# Patient Record
Sex: Male | Born: 1940 | Hispanic: Yes | Marital: Married | Smoking: Former smoker
Health system: Southern US, Community
[De-identification: ages and names within clinical notes are randomized; demographics above are authoritative.]

## PROBLEM LIST (undated history)

## (undated) DIAGNOSIS — C801 Malignant (primary) neoplasm, unspecified: Secondary | ICD-10-CM

## (undated) DIAGNOSIS — I10 Essential (primary) hypertension: Secondary | ICD-10-CM

## (undated) DIAGNOSIS — E7800 Pure hypercholesterolemia, unspecified: Secondary | ICD-10-CM

## (undated) DIAGNOSIS — I639 Cerebral infarction, unspecified: Secondary | ICD-10-CM

## (undated) DIAGNOSIS — R569 Unspecified convulsions: Secondary | ICD-10-CM

## (undated) HISTORY — DX: Malignant (primary) neoplasm, unspecified: C80.1

## (undated) HISTORY — DX: Cerebral infarction, unspecified: I63.9

## (undated) HISTORY — DX: Essential (primary) hypertension: I10

## (undated) HISTORY — DX: Pure hypercholesterolemia, unspecified: E78.00

## (undated) HISTORY — DX: Unspecified convulsions: R56.9

## (undated) HISTORY — PX: PORTACATH PLACEMENT: SHX2246

---

## 2009-05-22 ENCOUNTER — Emergency Department: Payer: Self-pay | Admitting: Emergency Medicine

## 2011-11-29 ENCOUNTER — Inpatient Hospital Stay: Payer: Self-pay | Admitting: Internal Medicine

## 2011-11-29 LAB — CBC
HCT: 46.5 % (ref 40.0–52.0)
HGB: 15.8 g/dL (ref 13.0–18.0)
MCHC: 34.1 g/dL (ref 32.0–36.0)
MCV: 95 fL (ref 80–100)
RBC: 4.91 10*6/uL (ref 4.40–5.90)
RDW: 12.6 % (ref 11.5–14.5)
WBC: 10.4 10*3/uL (ref 3.8–10.6)

## 2011-11-29 LAB — URINALYSIS, COMPLETE
Bacteria: NONE SEEN
Bilirubin,UR: NEGATIVE
Leukocyte Esterase: NEGATIVE
Nitrite: NEGATIVE
Squamous Epithelial: <1
WBC UR: 1 /HPF (ref 0–5)

## 2011-11-29 LAB — COMPREHENSIVE METABOLIC PANEL
Albumin: 4.1 g/dL (ref 3.4–5.0)
Alkaline Phosphatase: 66 U/L (ref 50–136)
Bilirubin,Total: 0.7 mg/dL (ref 0.2–1.0)
Co2: 28 mmol/L (ref 21–32)
EGFR (Non-African Amer.): >60
Glucose: 118 mg/dL — ABNORMAL HIGH (ref 65–99)
Osmolality: 278 (ref 275–301)
SGOT(AST): 24 U/L (ref 15–37)
SGPT (ALT): 34 U/L
Sodium: 138 mmol/L (ref 136–145)
Total Protein: 8.6 g/dL — ABNORMAL HIGH (ref 6.4–8.2)

## 2011-11-29 LAB — TROPONIN I: Troponin-I: <0.02 ng/mL

## 2011-11-29 LAB — CK TOTAL AND CKMB (NOT AT ARMC)
CK, Total: 140 U/L (ref 35–232)
CK, Total: 139 U/L (ref 35–232)
CK-MB: 1.3 ng/mL (ref 0.5–3.6)
CK-MB: 1 ng/mL (ref 0.5–3.6)

## 2011-11-29 LAB — HEMOGLOBIN A1C: Hemoglobin A1C: 6.5 % — ABNORMAL HIGH (ref 4.2–6.3)

## 2011-11-30 LAB — LIPID PANEL
Cholesterol: 139 mg/dL (ref 0–200)
HDL Cholesterol: 51 mg/dL (ref 40–60)
Ldl Cholesterol, Calc: 61 mg/dL (ref 0–100)
Triglycerides: 135 mg/dL (ref 0–200)
VLDL Cholesterol, Calc: 27 mg/dL (ref 5–40)

## 2011-11-30 LAB — TROPONIN I: Troponin-I: <0.02 ng/mL

## 2011-11-30 LAB — CK TOTAL AND CKMB (NOT AT ARMC): CK, Total: 143 U/L (ref 35–232)

## 2011-12-01 LAB — COMPREHENSIVE METABOLIC PANEL
Bilirubin,Total: 0.6 mg/dL (ref 0.2–1.0)
Calcium, Total: 8.5 mg/dL (ref 8.5–10.1)
Chloride: 102 mmol/L (ref 98–107)
Co2: 29 mmol/L (ref 21–32)
Creatinine: 0.99 mg/dL (ref 0.60–1.30)
EGFR (Non-African Amer.): >60
Potassium: 3.7 mmol/L (ref 3.5–5.1)
SGPT (ALT): 27 U/L
Sodium: 138 mmol/L (ref 136–145)

## 2011-12-01 LAB — CBC WITH DIFFERENTIAL/PLATELET
Basophil %: 0.5 %
Eosinophil #: 0.2 10*3/uL (ref 0.0–0.7)
Eosinophil %: 1.8 %
Lymphocyte #: 3 10*3/uL (ref 1.0–3.6)
Lymphocyte %: 31.9 %
MCH: 31 pg (ref 26.0–34.0)
MCHC: 33.1 g/dL (ref 32.0–36.0)
MCV: 94 fL (ref 80–100)
Monocyte #: 0.7 10*3/uL (ref 0.0–0.7)
Platelet: 233 10*3/uL (ref 150–440)
RBC: 4.85 10*6/uL (ref 4.40–5.90)
RDW: 13.3 % (ref 11.5–14.5)

## 2012-09-16 ENCOUNTER — Ambulatory Visit: Payer: Self-pay | Admitting: Oncology

## 2012-09-26 ENCOUNTER — Inpatient Hospital Stay: Payer: Self-pay | Admitting: Internal Medicine

## 2012-09-26 ENCOUNTER — Ambulatory Visit: Payer: Self-pay | Admitting: Neurology

## 2012-09-26 LAB — COMPREHENSIVE METABOLIC PANEL
Albumin: 3.9 g/dL (ref 3.4–5.0)
Alkaline Phosphatase: 99 U/L (ref 50–136)
Anion Gap: 6 — ABNORMAL LOW (ref 7–16)
BUN: 17 mg/dL (ref 7–18)
Bilirubin,Total: 0.4 mg/dL (ref 0.2–1.0)
Co2: 30 mmol/L (ref 21–32)
Creatinine: 0.97 mg/dL (ref 0.60–1.30)
EGFR (African American): >60
EGFR (Non-African Amer.): >60
Potassium: 4.3 mmol/L (ref 3.5–5.1)
Total Protein: 8.6 g/dL — ABNORMAL HIGH (ref 6.4–8.2)

## 2012-09-26 LAB — CBC WITH DIFFERENTIAL/PLATELET
Basophil #: 0 10*3/uL (ref 0.0–0.1)
Eosinophil #: 0.3 10*3/uL (ref 0.0–0.7)
Eosinophil %: 2.4 %
HCT: 46.5 % (ref 40.0–52.0)
Lymphocyte %: 24.5 %
MCH: 31.3 pg (ref 26.0–34.0)
MCHC: 33.4 g/dL (ref 32.0–36.0)
MCV: 94 fL (ref 80–100)
Monocyte #: 0.9 x10 3/mm (ref 0.2–1.0)
Monocyte %: 7.9 %
Neutrophil #: 7 10*3/uL — ABNORMAL HIGH (ref 1.4–6.5)
Platelet: 361 10*3/uL (ref 150–440)
RBC: 4.96 10*6/uL (ref 4.40–5.90)

## 2012-09-27 LAB — CBC WITH DIFFERENTIAL/PLATELET
Basophil #: 0 10*3/uL (ref 0.0–0.1)
Basophil %: 0.1 %
Eosinophil #: 0 10*3/uL (ref 0.0–0.7)
Eosinophil %: 0 %
HGB: 13.9 g/dL (ref 13.0–18.0)
Lymphocyte #: 1.4 10*3/uL (ref 1.0–3.6)
Lymphocyte %: 12 %
MCH: 32.7 pg (ref 26.0–34.0)
MCHC: 34.3 g/dL (ref 32.0–36.0)
MCV: 95 fL (ref 80–100)
Neutrophil #: 9.5 10*3/uL — ABNORMAL HIGH (ref 1.4–6.5)
RDW: 13.1 % (ref 11.5–14.5)
WBC: 11.3 10*3/uL — ABNORMAL HIGH (ref 3.8–10.6)

## 2012-09-27 LAB — BASIC METABOLIC PANEL
Calcium, Total: 8.7 mg/dL (ref 8.5–10.1)
Chloride: 104 mmol/L (ref 98–107)
Creatinine: 0.91 mg/dL (ref 0.60–1.30)
EGFR (Non-African Amer.): >60
Glucose: 127 mg/dL — ABNORMAL HIGH (ref 65–99)
Osmolality: 276 (ref 275–301)
Potassium: 4.1 mmol/L (ref 3.5–5.1)
Sodium: 137 mmol/L (ref 136–145)

## 2012-09-28 ENCOUNTER — Ambulatory Visit: Payer: Self-pay | Admitting: Oncology

## 2012-09-28 LAB — BASIC METABOLIC PANEL
Anion Gap: 5 — ABNORMAL LOW (ref 7–16)
Calcium, Total: 8.4 mg/dL — ABNORMAL LOW (ref 8.5–10.1)
Co2: 29 mmol/L (ref 21–32)
EGFR (Non-African Amer.): >60
Glucose: 148 mg/dL — ABNORMAL HIGH (ref 65–99)
Osmolality: 283 (ref 275–301)
Potassium: 3.9 mmol/L (ref 3.5–5.1)
Sodium: 140 mmol/L (ref 136–145)

## 2012-09-28 LAB — CBC WITH DIFFERENTIAL/PLATELET
Basophil #: 0 10*3/uL (ref 0.0–0.1)
Eosinophil #: 0 10*3/uL (ref 0.0–0.7)
HCT: 40.5 % (ref 40.0–52.0)
Lymphocyte %: 10.8 %
MCH: 30.7 pg (ref 26.0–34.0)
MCHC: 32.9 g/dL (ref 32.0–36.0)
MCV: 93 fL (ref 80–100)
Monocyte #: 0.3 x10 3/mm (ref 0.2–1.0)
Monocyte %: 2.3 %
Neutrophil #: 10.9 10*3/uL — ABNORMAL HIGH (ref 1.4–6.5)
Neutrophil %: 86.8 %
Platelet: 297 10*3/uL (ref 150–440)
RBC: 4.34 10*6/uL — ABNORMAL LOW (ref 4.40–5.90)
RDW: 13.3 % (ref 11.5–14.5)

## 2012-10-08 ENCOUNTER — Ambulatory Visit: Payer: Self-pay | Admitting: Vascular Surgery

## 2012-10-09 LAB — CBC CANCER CENTER
Comment - H1-Com1: NORMAL
HCT: 43.3 % (ref 40.0–52.0)
Lymphocytes: 25 %
MCH: 31 pg (ref 26.0–34.0)
MCHC: 33.5 g/dL (ref 32.0–36.0)
Monocytes: 5 %
Platelet: 285 x10 3/mm (ref 150–440)
RBC: 4.67 10*6/uL (ref 4.40–5.90)
RDW: 13.3 % (ref 11.5–14.5)

## 2012-10-13 ENCOUNTER — Ambulatory Visit: Payer: Self-pay | Admitting: Oncology

## 2012-10-13 LAB — PATHOLOGY REPORT

## 2012-10-13 LAB — PLATELET COUNT: Platelet: 300 10*3/uL (ref 150–440)

## 2012-10-13 LAB — PROTIME-INR
INR: 0.9
Prothrombin Time: 12.5 secs (ref 11.5–14.7)

## 2012-10-14 LAB — SLIDE CONSULT, PATHOLOGY ARMC

## 2012-10-15 LAB — CBC CANCER CENTER
Basophil #: 0.1 x10 3/mm (ref 0.0–0.1)
Basophil %: 0.6 %
Eosinophil #: 0.1 x10 3/mm (ref 0.0–0.7)
Eosinophil %: 0.7 %
Lymphocyte #: 1.3 x10 3/mm (ref 1.0–3.6)
Monocyte #: 0.7 x10 3/mm (ref 0.2–1.0)
Monocyte %: 7.2 %
Neutrophil #: 7.3 x10 3/mm — ABNORMAL HIGH (ref 1.4–6.5)
Platelet: 296 x10 3/mm (ref 150–440)
RBC: 4.5 10*6/uL (ref 4.40–5.90)
RDW: 13.3 % (ref 11.5–14.5)
WBC: 9.3 x10 3/mm (ref 3.8–10.6)

## 2012-10-15 LAB — COMPREHENSIVE METABOLIC PANEL
Alkaline Phosphatase: 85 U/L (ref 50–136)
Anion Gap: 10 (ref 7–16)
Bilirubin,Total: 0.3 mg/dL (ref 0.2–1.0)
Calcium, Total: 9.1 mg/dL (ref 8.5–10.1)
Chloride: 99 mmol/L (ref 98–107)
Co2: 28 mmol/L (ref 21–32)
Creatinine: 0.91 mg/dL (ref 0.60–1.30)
EGFR (African American): >60
Potassium: 4.2 mmol/L (ref 3.5–5.1)
SGOT(AST): 15 U/L (ref 15–37)
SGPT (ALT): 32 U/L (ref 12–78)
Sodium: 137 mmol/L (ref 136–145)

## 2012-10-17 ENCOUNTER — Ambulatory Visit: Payer: Self-pay | Admitting: Oncology

## 2012-10-22 LAB — COMPREHENSIVE METABOLIC PANEL
Albumin: 3.1 g/dL — ABNORMAL LOW (ref 3.4–5.0)
Alkaline Phosphatase: 92 U/L (ref 50–136)
Anion Gap: 7 (ref 7–16)
BUN: 12 mg/dL (ref 7–18)
Calcium, Total: 9.1 mg/dL (ref 8.5–10.1)
Co2: 31 mmol/L (ref 21–32)
Creatinine: 0.92 mg/dL (ref 0.60–1.30)
Osmolality: 272 (ref 275–301)
Potassium: 3.9 mmol/L (ref 3.5–5.1)
Total Protein: 6.7 g/dL (ref 6.4–8.2)

## 2012-10-22 LAB — CBC CANCER CENTER
Basophil #: 0 x10 3/mm (ref 0.0–0.1)
Basophil %: 0.5 %
Eosinophil %: 10.7 %
HCT: 40.7 % (ref 40.0–52.0)
Lymphocyte %: 68.4 %
MCHC: 33.7 g/dL (ref 32.0–36.0)
MCV: 92 fL (ref 80–100)
Monocyte #: 0.4 x10 3/mm (ref 0.2–1.0)
Neutrophil #: 0.1 x10 3/mm — ABNORMAL LOW (ref 1.4–6.5)
Neutrophil %: 3.5 %
Platelet: 181 x10 3/mm (ref 150–440)
RBC: 4.43 10*6/uL (ref 4.40–5.90)

## 2012-11-05 LAB — COMPREHENSIVE METABOLIC PANEL
Albumin: 3.3 g/dL — ABNORMAL LOW (ref 3.4–5.0)
Anion Gap: 6 — ABNORMAL LOW (ref 7–16)
BUN: 15 mg/dL (ref 7–18)
Bilirubin,Total: 0.3 mg/dL (ref 0.2–1.0)
Creatinine: 1.06 mg/dL (ref 0.60–1.30)
EGFR (African American): >60
EGFR (Non-African Amer.): >60
Glucose: 123 mg/dL — ABNORMAL HIGH (ref 65–99)
Osmolality: 282 (ref 275–301)
Potassium: 4.2 mmol/L (ref 3.5–5.1)
SGOT(AST): 13 U/L — ABNORMAL LOW (ref 15–37)
SGPT (ALT): 30 U/L (ref 12–78)
Sodium: 140 mmol/L (ref 136–145)
Total Protein: 7.1 g/dL (ref 6.4–8.2)

## 2012-11-05 LAB — CBC CANCER CENTER
Basophil %: 0.8 %
Eosinophil #: 0.1 x10 3/mm (ref 0.0–0.7)
Eosinophil %: 0.5 %
MCH: 30.4 pg (ref 26.0–34.0)
MCHC: 32.9 g/dL (ref 32.0–36.0)
MCV: 93 fL (ref 80–100)
Monocyte #: 1 x10 3/mm (ref 0.2–1.0)
Platelet: 400 x10 3/mm (ref 150–440)
RBC: 4.38 10*6/uL — ABNORMAL LOW (ref 4.40–5.90)
RDW: 13.3 % (ref 11.5–14.5)

## 2012-11-14 ENCOUNTER — Ambulatory Visit: Payer: Self-pay | Admitting: Oncology

## 2012-11-19 LAB — CBC CANCER CENTER
Basophil #: 0.1 x10 3/mm (ref 0.0–0.1)
Basophil %: 0.4 %
Eosinophil #: 0.1 x10 3/mm (ref 0.0–0.7)
HCT: 38.9 % — ABNORMAL LOW (ref 40.0–52.0)
HGB: 12.7 g/dL — ABNORMAL LOW (ref 13.0–18.0)
Lymphocyte #: 3.7 x10 3/mm — ABNORMAL HIGH (ref 1.0–3.6)
MCHC: 32.7 g/dL (ref 32.0–36.0)
MCV: 93 fL (ref 80–100)
Monocyte #: 1.1 x10 3/mm — ABNORMAL HIGH (ref 0.2–1.0)
Monocyte %: 5.5 %
Neutrophil #: 15.9 x10 3/mm — ABNORMAL HIGH (ref 1.4–6.5)
Neutrophil %: 75.9 %
WBC: 20.9 x10 3/mm — ABNORMAL HIGH (ref 3.8–10.6)

## 2012-11-19 LAB — COMPREHENSIVE METABOLIC PANEL
Albumin: 3.2 g/dL — ABNORMAL LOW (ref 3.4–5.0)
Alkaline Phosphatase: 116 U/L (ref 50–136)
Anion Gap: 8 (ref 7–16)
Co2: 30 mmol/L (ref 21–32)
Creatinine: 1 mg/dL (ref 0.60–1.30)
Glucose: 120 mg/dL — ABNORMAL HIGH (ref 65–99)
Osmolality: 277 (ref 275–301)
Potassium: 4 mmol/L (ref 3.5–5.1)
SGOT(AST): 19 U/L (ref 15–37)

## 2012-11-26 LAB — CBC CANCER CENTER
Basophil #: 0 x10 3/mm (ref 0.0–0.1)
Basophil %: 1.1 %
Eosinophil #: 0 x10 3/mm (ref 0.0–0.7)
Eosinophil %: 0.4 %
HCT: 39 % — ABNORMAL LOW (ref 40.0–52.0)
HGB: 13.1 g/dL (ref 13.0–18.0)
Lymphocyte #: 2.6 x10 3/mm (ref 1.0–3.6)
Lymphocyte %: 84 %
MCH: 31 pg (ref 26.0–34.0)
MCHC: 33.5 g/dL (ref 32.0–36.0)
MCV: 93 fL (ref 80–100)
Monocyte #: 0.4 x10 3/mm (ref 0.2–1.0)
Monocyte %: 13.5 %
Neutrophil #: 0 x10 3/mm — ABNORMAL LOW (ref 1.4–6.5)
Neutrophil %: 1 %
Platelet: 226 x10 3/mm (ref 150–440)
RBC: 4.22 10*6/uL — ABNORMAL LOW (ref 4.40–5.90)
RDW: 13.5 % (ref 11.5–14.5)
WBC: 3 x10 3/mm — ABNORMAL LOW (ref 3.8–10.6)

## 2012-12-03 LAB — CBC CANCER CENTER
Basophil %: 0.3 %
Eosinophil #: 0 x10 3/mm (ref 0.0–0.7)
Eosinophil %: 0.1 %
HCT: 36 % — ABNORMAL LOW (ref 40.0–52.0)
HGB: 11.6 g/dL — ABNORMAL LOW (ref 13.0–18.0)
Lymphocyte #: 3.2 x10 3/mm (ref 1.0–3.6)
Lymphocyte %: 11.4 %
MCH: 29.9 pg (ref 26.0–34.0)
MCV: 93 fL (ref 80–100)
Monocyte %: 5.9 %
Neutrophil %: 82.3 %
RDW: 14.3 % (ref 11.5–14.5)
WBC: 28.1 x10 3/mm — ABNORMAL HIGH (ref 3.8–10.6)

## 2012-12-03 LAB — COMPREHENSIVE METABOLIC PANEL
Albumin: 3.3 g/dL — ABNORMAL LOW (ref 3.4–5.0)
Alkaline Phosphatase: 136 U/L (ref 50–136)
BUN: 10 mg/dL (ref 7–18)
Chloride: 100 mmol/L (ref 98–107)
Co2: 27 mmol/L (ref 21–32)
Creatinine: 1.05 mg/dL (ref 0.60–1.30)
EGFR (African American): >60
Potassium: 4.3 mmol/L (ref 3.5–5.1)
SGOT(AST): 22 U/L (ref 15–37)
SGPT (ALT): 36 U/L (ref 12–78)
Sodium: 138 mmol/L (ref 136–145)
Total Protein: 7.4 g/dL (ref 6.4–8.2)

## 2012-12-09 LAB — URINALYSIS, COMPLETE
Bilirubin,UR: NEGATIVE
Leukocyte Esterase: NEGATIVE
Ph: 8 (ref 4.5–8.0)
RBC,UR: <1 /HPF (ref 0–5)
Squamous Epithelial: NONE SEEN
WBC UR: 1 /HPF (ref 0–5)

## 2012-12-09 LAB — CBC WITH DIFFERENTIAL/PLATELET
Basophil #: 0 10*3/uL (ref 0.0–0.1)
Basophil %: 2.6 %
Eosinophil #: 0 10*3/uL (ref 0.0–0.7)
Eosinophil %: 1 %
HGB: 10.3 g/dL — ABNORMAL LOW (ref 13.0–18.0)
Lymphocyte #: 0.7 10*3/uL — ABNORMAL LOW (ref 1.0–3.6)
Lymphocyte %: 63.6 %
MCH: 30.3 pg (ref 26.0–34.0)
MCHC: 33.2 g/dL (ref 32.0–36.0)
Neutrophil %: 27.2 %
RBC: 3.4 10*6/uL — ABNORMAL LOW (ref 4.40–5.90)
RDW: 14 % (ref 11.5–14.5)
WBC: 1 10*3/uL — CL (ref 3.8–10.6)

## 2012-12-09 LAB — COMPREHENSIVE METABOLIC PANEL
Albumin: 2.8 g/dL — ABNORMAL LOW (ref 3.4–5.0)
Alkaline Phosphatase: 97 U/L (ref 50–136)
BUN: 16 mg/dL (ref 7–18)
Co2: 28 mmol/L (ref 21–32)
Creatinine: 0.83 mg/dL (ref 0.60–1.30)
Osmolality: 271 (ref 275–301)
Sodium: 134 mmol/L — ABNORMAL LOW (ref 136–145)
Total Protein: 6.1 g/dL — ABNORMAL LOW (ref 6.4–8.2)

## 2012-12-09 LAB — TROPONIN I: Troponin-I: <0.02 ng/mL

## 2012-12-10 ENCOUNTER — Inpatient Hospital Stay: Payer: Self-pay | Admitting: Internal Medicine

## 2012-12-10 LAB — TROPONIN I
Troponin-I: 0.03 ng/mL
Troponin-I: 0.02 ng/mL

## 2012-12-10 LAB — CK TOTAL AND CKMB (NOT AT ARMC)
CK, Total: 70 U/L (ref 35–232)
CK-MB: <0.5 ng/mL — ABNORMAL LOW (ref 0.5–3.6)
CK-MB: <0.5 ng/mL — ABNORMAL LOW (ref 0.5–3.6)

## 2012-12-11 LAB — CBC WITH DIFFERENTIAL/PLATELET
Eosinophil #: 0 10*3/uL (ref 0.0–0.7)
HCT: 29.5 % — ABNORMAL LOW (ref 40.0–52.0)
HGB: 10.4 g/dL — ABNORMAL LOW (ref 13.0–18.0)
Lymphocyte #: 1.1 10*3/uL (ref 1.0–3.6)
MCH: 32.6 pg (ref 26.0–34.0)
Monocyte #: 0.6 x10 3/mm (ref 0.2–1.0)
RBC: 3.17 10*6/uL — ABNORMAL LOW (ref 4.40–5.90)
WBC: 1.9 10*3/uL — CL (ref 3.8–10.6)

## 2012-12-11 LAB — BASIC METABOLIC PANEL
Anion Gap: 4 — ABNORMAL LOW (ref 7–16)
Calcium, Total: 8.1 mg/dL — ABNORMAL LOW (ref 8.5–10.1)
EGFR (African American): >60
Osmolality: 267 (ref 275–301)
Potassium: 3.5 mmol/L (ref 3.5–5.1)

## 2012-12-15 ENCOUNTER — Ambulatory Visit: Payer: Self-pay | Admitting: Oncology

## 2012-12-15 LAB — CULTURE, BLOOD (SINGLE)

## 2012-12-22 ENCOUNTER — Ambulatory Visit: Payer: Self-pay | Admitting: Oncology

## 2012-12-28 LAB — COMPREHENSIVE METABOLIC PANEL
Albumin: 3.2 g/dL — ABNORMAL LOW (ref 3.4–5.0)
Alkaline Phosphatase: 90 U/L (ref 50–136)
BUN: 9 mg/dL (ref 7–18)
Bilirubin,Total: 0.2 mg/dL (ref 0.2–1.0)
Chloride: 102 mmol/L (ref 98–107)
Co2: 28 mmol/L (ref 21–32)
Creatinine: 1.01 mg/dL (ref 0.60–1.30)
EGFR (Non-African Amer.): >60
Osmolality: 279 (ref 275–301)
SGOT(AST): 17 U/L (ref 15–37)
SGPT (ALT): 28 U/L (ref 12–78)
Sodium: 140 mmol/L (ref 136–145)
Total Protein: 7.1 g/dL (ref 6.4–8.2)

## 2012-12-28 LAB — CBC CANCER CENTER
Basophil #: 0.1 x10 3/mm (ref 0.0–0.1)
HCT: 36.3 % — ABNORMAL LOW (ref 40.0–52.0)
HGB: 11.6 g/dL — ABNORMAL LOW (ref 13.0–18.0)
Lymphocyte #: 2.4 x10 3/mm (ref 1.0–3.6)
Lymphocyte %: 20 %
MCH: 29.8 pg (ref 26.0–34.0)
MCHC: 32.1 g/dL (ref 32.0–36.0)
MCV: 93 fL (ref 80–100)
Monocyte %: 10.1 %
RBC: 3.9 10*6/uL — ABNORMAL LOW (ref 4.40–5.90)
RDW: 15.4 % — ABNORMAL HIGH (ref 11.5–14.5)
WBC: 12.1 x10 3/mm — ABNORMAL HIGH (ref 3.8–10.6)

## 2013-01-14 ENCOUNTER — Ambulatory Visit: Payer: Self-pay | Admitting: Oncology

## 2013-01-29 LAB — CBC CANCER CENTER
Basophil #: 0 x10 3/mm (ref 0.0–0.1)
Basophil %: 0.2 %
Eosinophil #: 0.2 x10 3/mm (ref 0.0–0.7)
Eosinophil %: 3.3 %
HGB: 13.7 g/dL (ref 13.0–18.0)
Lymphocyte #: 1.4 x10 3/mm (ref 1.0–3.6)
Lymphocyte %: 19.6 %
MCH: 30.7 pg (ref 26.0–34.0)
MCHC: 33 g/dL (ref 32.0–36.0)
MCV: 93 fL (ref 80–100)
Monocyte #: 0.7 x10 3/mm (ref 0.2–1.0)
Monocyte %: 10.6 %
Neutrophil #: 4.6 x10 3/mm (ref 1.4–6.5)
Platelet: 294 x10 3/mm (ref 150–440)
RBC: 4.48 10*6/uL (ref 4.40–5.90)
RDW: 14.7 % — ABNORMAL HIGH (ref 11.5–14.5)

## 2013-02-14 ENCOUNTER — Ambulatory Visit: Payer: Self-pay | Admitting: Oncology

## 2013-02-15 LAB — CBC CANCER CENTER
Basophil #: 0 x10 3/mm (ref 0.0–0.1)
Basophil %: 0.2 %
Eosinophil #: 0.3 x10 3/mm (ref 0.0–0.7)
HCT: 41.8 % (ref 40.0–52.0)
HGB: 13.8 g/dL (ref 13.0–18.0)
Lymphocyte %: 19.3 %
MCH: 30.3 pg (ref 26.0–34.0)
MCHC: 33 g/dL (ref 32.0–36.0)
MCV: 92 fL (ref 80–100)
Monocyte #: 0.8 x10 3/mm (ref 0.2–1.0)
Neutrophil #: 4.4 x10 3/mm (ref 1.4–6.5)
Platelet: 226 x10 3/mm (ref 150–440)
RDW: 13.9 % (ref 11.5–14.5)

## 2013-03-16 ENCOUNTER — Ambulatory Visit: Payer: Self-pay | Admitting: Oncology

## 2013-05-20 ENCOUNTER — Ambulatory Visit: Payer: Self-pay | Admitting: Oncology

## 2013-06-03 LAB — CBC CANCER CENTER
Basophil #: 0 x10 3/mm (ref 0.0–0.1)
Eosinophil #: 0.2 x10 3/mm (ref 0.0–0.7)
Eosinophil %: 2.1 %
HCT: 42.1 % (ref 40.0–52.0)
HGB: 13.8 g/dL (ref 13.0–18.0)
Lymphocyte #: 2.4 x10 3/mm (ref 1.0–3.6)
Lymphocyte %: 30.9 %
MCH: 29.5 pg (ref 26.0–34.0)
MCHC: 32.9 g/dL (ref 32.0–36.0)
MCV: 90 fL (ref 80–100)
Monocyte #: 0.6 x10 3/mm (ref 0.2–1.0)
Platelet: 277 x10 3/mm (ref 150–440)
RDW: 15.1 % — ABNORMAL HIGH (ref 11.5–14.5)
WBC: 7.7 x10 3/mm (ref 3.8–10.6)

## 2013-06-03 LAB — LACTATE DEHYDROGENASE: LDH: 167 U/L (ref 85–241)

## 2013-06-16 ENCOUNTER — Ambulatory Visit: Payer: Self-pay | Admitting: Oncology

## 2013-07-19 ENCOUNTER — Ambulatory Visit: Payer: Self-pay | Admitting: Radiation Oncology

## 2013-08-16 ENCOUNTER — Ambulatory Visit: Payer: Self-pay | Admitting: Radiation Oncology

## 2013-08-26 LAB — CBC CANCER CENTER
Basophil %: 0.2 %
Eosinophil #: 0.1 x10 3/mm (ref 0.0–0.7)
HCT: 44.9 % (ref 40.0–52.0)
HGB: 14.4 g/dL (ref 13.0–18.0)
Lymphocyte #: 2.5 x10 3/mm (ref 1.0–3.6)
Monocyte #: 0.6 x10 3/mm (ref 0.2–1.0)
Monocyte %: 8 %
Neutrophil #: 4.6 x10 3/mm (ref 1.4–6.5)
Neutrophil %: 58.6 %
Platelet: 264 x10 3/mm (ref 150–440)
RDW: 14 % (ref 11.5–14.5)

## 2013-08-26 LAB — LACTATE DEHYDROGENASE: LDH: 161 U/L (ref 85–241)

## 2013-09-16 ENCOUNTER — Ambulatory Visit: Payer: Self-pay | Admitting: Radiation Oncology

## 2013-10-17 ENCOUNTER — Ambulatory Visit: Payer: Self-pay | Admitting: Radiation Oncology

## 2013-11-23 ENCOUNTER — Ambulatory Visit: Payer: Self-pay | Admitting: Oncology

## 2013-11-24 ENCOUNTER — Ambulatory Visit: Payer: Self-pay | Admitting: Oncology

## 2013-11-25 LAB — CBC CANCER CENTER
Basophil #: 0 x10 3/mm (ref 0.0–0.1)
Basophil %: 0.2 %
Eosinophil #: 0.1 x10 3/mm (ref 0.0–0.7)
Eosinophil %: 1.6 %
HCT: 42.9 % (ref 40.0–52.0)
HGB: 13.8 g/dL (ref 13.0–18.0)
Lymphocyte #: 2.9 x10 3/mm (ref 1.0–3.6)
Lymphocyte %: 32.8 %
MCH: 30 pg (ref 26.0–34.0)
MCHC: 32.2 g/dL (ref 32.0–36.0)
MCV: 93 fL (ref 80–100)
MONO ABS: 0.6 x10 3/mm (ref 0.2–1.0)
Monocyte %: 7.1 %
NEUTROS ABS: 5.2 x10 3/mm (ref 1.4–6.5)
Neutrophil %: 58.3 %
PLATELETS: 262 x10 3/mm (ref 150–440)
RBC: 4.6 10*6/uL (ref 4.40–5.90)
RDW: 13.9 % (ref 11.5–14.5)
WBC: 8.9 x10 3/mm (ref 3.8–10.6)

## 2013-11-25 LAB — BASIC METABOLIC PANEL
Anion Gap: 9 (ref 7–16)
BUN: 17 mg/dL (ref 7–18)
CO2: 29 mmol/L (ref 21–32)
Calcium, Total: 9.7 mg/dL (ref 8.5–10.1)
Chloride: 104 mmol/L (ref 98–107)
Creatinine: 1.15 mg/dL (ref 0.60–1.30)
EGFR (Non-African Amer.): >60
GLUCOSE: 95 mg/dL (ref 65–99)
OSMOLALITY: 284 (ref 275–301)
Potassium: 3.9 mmol/L (ref 3.5–5.1)
SODIUM: 142 mmol/L (ref 136–145)

## 2013-11-25 LAB — LACTATE DEHYDROGENASE: LDH: 196 U/L (ref 85–241)

## 2013-12-15 ENCOUNTER — Ambulatory Visit: Payer: Self-pay | Admitting: Oncology

## 2014-01-14 ENCOUNTER — Ambulatory Visit: Payer: Self-pay | Admitting: Oncology

## 2014-02-17 ENCOUNTER — Ambulatory Visit: Payer: Self-pay | Admitting: Oncology

## 2014-03-16 ENCOUNTER — Ambulatory Visit: Payer: Self-pay | Admitting: Oncology

## 2014-04-16 ENCOUNTER — Ambulatory Visit: Payer: Self-pay | Admitting: Oncology

## 2014-05-17 ENCOUNTER — Ambulatory Visit: Payer: Self-pay | Admitting: Oncology

## 2014-05-27 LAB — CBC CANCER CENTER
Basophil #: 0 x10 3/mm (ref 0.0–0.1)
Basophil %: 0.4 %
Eosinophil #: 0.2 x10 3/mm (ref 0.0–0.7)
Eosinophil %: 2.6 %
HCT: 47.1 % (ref 40.0–52.0)
HGB: 15.3 g/dL (ref 13.0–18.0)
Lymphocyte #: 2.6 x10 3/mm (ref 1.0–3.6)
Lymphocyte %: 32.7 %
MCH: 30.4 pg (ref 26.0–34.0)
MCHC: 32.5 g/dL (ref 32.0–36.0)
MCV: 94 fL (ref 80–100)
Monocyte #: 0.7 x10 3/mm (ref 0.2–1.0)
Monocyte %: 8.7 %
NEUTROS PCT: 55.6 %
Neutrophil #: 4.5 x10 3/mm (ref 1.4–6.5)
Platelet: 251 x10 3/mm (ref 150–440)
RBC: 5.04 10*6/uL (ref 4.40–5.90)
RDW: 14.3 % (ref 11.5–14.5)
WBC: 8.1 x10 3/mm (ref 3.8–10.6)

## 2014-05-27 LAB — BASIC METABOLIC PANEL
Anion Gap: 6 — ABNORMAL LOW (ref 7–16)
BUN: 14 mg/dL (ref 7–18)
CALCIUM: 9 mg/dL (ref 8.5–10.1)
Chloride: 105 mmol/L (ref 98–107)
Co2: 28 mmol/L (ref 21–32)
Creatinine: 1.02 mg/dL (ref 0.60–1.30)
EGFR (African American): >60
EGFR (Non-African Amer.): >60
GLUCOSE: 111 mg/dL — AB (ref 65–99)
OSMOLALITY: 279 (ref 275–301)
Potassium: 4 mmol/L (ref 3.5–5.1)
Sodium: 139 mmol/L (ref 136–145)

## 2014-05-27 LAB — LACTATE DEHYDROGENASE: LDH: 164 U/L (ref 85–241)

## 2014-06-09 ENCOUNTER — Ambulatory Visit: Payer: Self-pay | Admitting: Vascular Surgery

## 2014-06-16 ENCOUNTER — Ambulatory Visit: Payer: Self-pay | Admitting: Oncology

## 2014-12-08 ENCOUNTER — Ambulatory Visit
Admit: 2014-12-08 | Disposition: A | Discharge status: Home / Self Care | Payer: Self-pay | Attending: Oncology | Admitting: Oncology

## 2014-12-09 ENCOUNTER — Ambulatory Visit: Payer: Self-pay | Admitting: Oncology

## 2014-12-09 LAB — CBC CANCER CENTER
BASOS ABS: 0 x10 3/mm (ref 0.0–0.1)
Basophil %: 0.4 %
Eosinophil #: 0.3 x10 3/mm (ref 0.0–0.7)
Eosinophil %: 3.8 %
HCT: 44.2 % (ref 40.0–52.0)
HGB: 14.4 g/dL (ref 13.0–18.0)
LYMPHS ABS: 2.1 x10 3/mm (ref 1.0–3.6)
Lymphocyte %: 30.8 %
MCH: 30.2 pg (ref 26.0–34.0)
MCHC: 32.4 g/dL (ref 32.0–36.0)
MCV: 93 fL (ref 80–100)
MONOS PCT: 8.4 %
Monocyte #: 0.6 x10 3/mm (ref 0.2–1.0)
NEUTROS ABS: 3.9 x10 3/mm (ref 1.4–6.5)
Neutrophil %: 56.6 %
PLATELETS: 257 x10 3/mm (ref 150–440)
RBC: 4.76 10*6/uL (ref 4.40–5.90)
RDW: 13.8 % (ref 11.5–14.5)
WBC: 6.9 x10 3/mm (ref 3.8–10.6)

## 2014-12-09 LAB — BASIC METABOLIC PANEL
Anion Gap: 5 — ABNORMAL LOW (ref 7–16)
BUN: 23 mg/dL — ABNORMAL HIGH
CALCIUM: 9.3 mg/dL
Chloride: 105 mmol/L
Co2: 29 mmol/L
Creatinine: 1.08 mg/dL
EGFR (African American): >60
Glucose: 121 mg/dL — ABNORMAL HIGH
Potassium: 4.4 mmol/L
SODIUM: 139 mmol/L

## 2014-12-09 LAB — LACTATE DEHYDROGENASE: LDH: 153 U/L

## 2014-12-16 ENCOUNTER — Ambulatory Visit
Admit: 2014-12-16 | Disposition: A | Discharge status: Home / Self Care | Payer: Self-pay | Attending: Oncology | Admitting: Oncology

## 2014-12-30 ENCOUNTER — Other Ambulatory Visit: Payer: Self-pay | Admitting: Oncology

## 2014-12-30 DIAGNOSIS — C8298 Follicular lymphoma, unspecified, lymph nodes of multiple sites: Secondary | ICD-10-CM

## 2015-01-06 NOTE — Consult Note (Signed)
PATIENT NAME:  Erik Shelton, Erik Shelton MR#:  [id number] DATE OF BIRTH:  [birth year]  DATE OF CONSULTATION:  09/26/2012  REFERRING PHYSICIAN:   CONSULTING PHYSICIAN:  Leotis Pain, MD  REASON FOR CONSULTATION:  Suspected seizure activity.   HISTORY OF PRESENT ILLNESS: This is a 74 year old male with past medical history hypertension, hyperlipidemia, intracranial hemorrhage in 2010, questionable history of seizures 4 years ago who was sent from the walk-in clinic for symptoms that include hoarseness, dysphagia and difficulty breathing. The patient was found to have a peritonsillar abscess with suspicion for possible throat cancer and the patient is scheduled to undergo surgery. Information is obtained from the patient's family members who are at bedside and who speak English and the patient who has difficulty with the [place] language.  During the time when the patient was explained that there was a possibility of him having throat cancer, he became anxious, had what was suspected and what looked like syncopal event and he was noted to have shaking that lasted possibly up to 1 to 2 minutes. When questioned about the shaking, the patient states he was aware of the shaking, he knew what was going on but he said he was very anxious and he was concerned because of the suspected throat cancer. During that time of the event the patient was awake and oriented. There was no tongue biting, no urinary incontinence. As the shaking stopped, the patient's wife states that she was able to talk to him without any problems. There was no postictal state. The patient's family members do state he has anxiety, especially when  there are blood draws and there are medical decisions to be made.  There is a questionable history of seizure activity that happened 4 years ago and it was a similar situation where the patient was told he had a stroke and then at that time he started shaking.  The patient was started on antiepileptic medication  for a year and because there was no seizure activity, he was taken off it.    PAST SURGICAL HISTORY:  None.  REVIEW OF SYSTEMS:  No fever, chills, nausea, vomiting, no chest pain. No shortness of breath. No urinary incontinence. No fecal incontinence. No leg like swelling. No focal weakness on one side of the body compared to the other.   SOCIAL HISTORY:  He gets quit smoking in 1991.  FAMILY HISTORY:  Noncontributory.   NEUROLOGICAL EXAMINATION: VITAL SIGNS:  Temperature 97.8, pulse 99, respirations 18, blood pressure and 139/90. GENERAL:  The rest of neurological evaluation with translation.  The patient is awake oriented x 4, knows day, time, location and is able to tell me how many quarters are and in $1.75.  NEUROLOGIC: Cranial nerves examination:  Pupils are 3 mm and 2 mm, reactive bilaterally.  Extraocular movements are intact. Eyebrows are symmetrical bilaterally. No signs of ptosis noted. Visual fields intact bilaterally. Facial sensation and facial motor intact. Tongue is midline. Uvula elevates symmetrically. Shoulder shrug is equal and symmetrical bilaterally.  Motor examination:  Tone is within normal limits. No rigidity or spasticity noted, 5 out of 5 bilateral upper and lower extremities noted.  Deep tendon reflexes are 2+ and symmetrical bilaterally. Sensation is intact to light touch and pin prick.  Gait was not assessed. No signs of dysmetria on finger-to-nose testing was noted.   MEDICATIONS:  Admitting orders and past notes were reviewed.    LABORATORY AND DIAGNOSTIC DATA:  His laboratory findings and imaging such as CAT scan of the head was  reviewed and did not show any acute intracranial pathology.   IMPRESSION: This is a 74 year old male with history of hypertension, hyperlipidemia, hemorrhagic stroke in 2010, suspected history of seizure about 4 years ago, who comes in with paratonsillar mass and is going to need surgery.  Based on explanation and with the family  I  explained to him what the seizure looked like and the patient's no loss of level of consciousness during, bilateral shaking of his upper and lower extremities upper and lower extremities, I do not do not believe the patient had a real seizure.  If the patient would have a generalized tonic-clonic seizure it is coming from his bilateral hemispheres and there should be loss of consciousness, which apparently did not occur. There was no postictal state, there was no tongue biting, no urinary incontinence. The patient is also very anxious to medical procedures and to hospital jargon, as well as being in the hospital. The patient returned to baseline very quickly and he was able to communicate with his family members less than 5 minutes post incident. I suspect the patient could have had a syncopal episode or he could have had a convulsive syncope or possibly he was shaking because of anxiety. He appears to have similar symptoms about 4 years ago and was actually started on antiepileptic medication and was taken off after seizure freedom for 1 year, so at this point, I do not believe this is true epileptic seizure.  I would not start him on antiepileptic medication unless this recurs. Would not recommend any further imaging such as MRI.  It was a pleasure seeing this patient.      ____________________________ Leotis Pain, MD yz:ct D: 09/26/2012 14:33:17 ET T: 09/27/2012 06:56:44 ET JOB#: [id number]  cc: Leotis Pain, MD, <Dictator> Leotis Pain MD ELECTRONICALLY SIGNED 10/11/2012 13:41

## 2015-01-06 NOTE — H&P (Signed)
PATIENT NAME:  Erik Shelton, Erik Shelton MR#:  [id number] DATE OF BIRTH:  1941-01-31  DATE OF ADMISSION:  09/26/2012  PRIMARY CARE PHYSICIAN:  Dr. Frazier Richards at [facility].   CHIEF COMPLAINT: Hoarseness, dysphagia, and difficulty breathing.   HISTORY OF PRESENT ILLNESS: This is a 74 year old Hispanic male who was seen over at the [facility] for the symptoms described above going on for several months, was sent over to the Emergency Room because of evidence of a peritonsillar abscess, was evaluated by  Nadeen Landau who contacted [place] to admit the patient. The plan is to evaluate this peritonsillar abscess with suspicion for possible throat cancer to undergo surgery per Dr. Loletta Specter today. He is planning to obtain a CT scan of the neck to further evaluate this possible mass. While in the Emergency Room, he had a vasovagal event and has returned back to his baseline state. Shortly after that, according to ER physician, he had a short course of a generalized seizure and was a bit altered shortly after that but now is back to his baseline state. Talking to the family, they state that he has had a seizure history four years ago but no longer on medications. He also has had a history of stroke in the past.   PAST MEDICAL HISTORY: A known history of hypertension, hyperlipidemia, and intracranial hemorrhage in 2010. A questionable history of seizure and a questionable history of CVA in the past, besides the intracranial hemorrhage.   SURGERY: None.   REVIEW OF SYSTEMS: As stated above.   SOCIAL HISTORY: Quit smoking in 1991.   FAMILY HISTORY: Noncontributory.   PHYSICAL EXAMINATION:  VITAL SIGNS: Temperature 97.8, pulse 81, blood pressure 137/76, oxygenation 95%.  GENERAL: This is a non-ill-appearing Hispanic male in no apparent distress, alert and oriented x 3 but non-English-speaking but does understand [place]. The family is at the bedside.  HEENT: Extraocular movements intact. Pupils equal and  reactive to light and accommodation. Oral exam shows significant edema and erythema of the posterior pharynx consistent with a peritonsillar abscess. The airway is minimal at this time, but he is in no acute distress.  CARDIOVASCULAR: Regular rhythm and rhythm. RESPIRATORY: Clear to auscultation bilaterally.  ABDOMEN: Soft, nontender.  SKIN: Normal color, turgor. No cyanosis.  NEUROLOGIC: No focal deficits at this time. Strength 5/5 throughout.   PERTINENT LABS: Sodium 138, potassium not available, creatinine 0.97, glucose of 112, white blood cell count 10.8, hemoglobin 15.5, platelets of 361. A CT scan is pending.   ASSESSMENT AND PLAN:  1. Concern for a throat cancer/peritonsillar abscess. The patient will be admitted for further evaluation per recommendations of Dr. Nadeen Landau. Plans to go to surgery per Dr. Carlis Abbott. Obtain a CT of the neck for further evaluation. Further consult with Oncology will depend upon the biopsy in the procedure.  2. A history of seizure disorder. It is questionable whether or not the patient has had history according to the outside notes. He did have a witnessed seizure per ER physician here, and we will consult Neurology for evaluation. We will hold off on loading him with any seizure medication at this time. Now if he has another seizure, we will go ahead and load him at that time, but we will monitor him closely.  3. Hypertension. Remains stable at this time. Although he is n.p.o., we will hold his medications as he has remains stable off the medications.  4. Hyperlipidemia. We will restart the cholesterol medication once he is able to restart his diet  and swallow pills.   DISPOSITION: He meets inpatient criteria. Anticipate two nights stay for him. Will be followed by Nadeen Landau, Neurology consult pending, anticipate surgery today.  ____________________________ Dion Body, MD kl:jm D: 09/26/2012 11:38:33 ET T: 09/26/2012 12:20:09  ET JOB#: [id number]  cc: Dion Body, MD, <Dictator> Dion Body MD ELECTRONICALLY SIGNED 10/02/2012 10:42

## 2015-01-06 NOTE — Consult Note (Signed)
History of Present Illness:  Reason for Consult T-cell lymphoma recently received chemotherapy, neutropenia.   HPI   Patient admitted to the hospital overnight with increasing shortness of breath and cough.  He was not febrile.  Currently he feels improved, but not back to his baseline.  He last received chemotherapy on December 03, 2012 Followed by [facility] on March 21.  He has no neurologic complaints.  He denies any chest pain. He does not complain of dysphagia today.  He denies any nausea, vomiting, constipation, or diarrhea.  He has no melena or hematochezia.  He has no urinary complaints.  Patient offers no further specific complaints today.  PFSH:  Additional Past Medical and Surgical History Hypertension, hyperlipidemia, intracranial hemorrhage, questionable seizure history.  Family history: Negative and noncontributory.  Social history: Previous tobacco, quit in 1991.  Denies alcohol.   Review of Systems:  Performance Status (ECOG) 0   Review of Systems   As per HPI. Otherwise, 10 point system review was negative.   NURSING NOTES: **Vital Signs.:   27-Mar-14 09:40   Vital Signs Type: Routine   Temperature Temperature (F): 99.1   Celsius: 37.2   Temperature Source: oral   Pulse Pulse: 103   Respirations Respirations: 18   Systolic BP Systolic BP: 354   Diastolic BP (mmHg) Diastolic BP (mmHg): 84   Mean BP: 109   Pulse Ox % Pulse Ox %: 96   Pulse Ox Activity Level: At rest   Oxygen Delivery: Room Air/ 21 %   Physical Exam:  Physical Exam General: Well-developed, well-nourished, no acute distress. Eyes: Pink conjunctiva, anicteric sclera. HEENT: Normocephalic, moist mucous membranes, clear oropharnyx. Lungs: Clear to auscultation bilaterally. Heart: Regular rate and rhythm. No rubs, murmurs, or gallops. Abdomen: Soft, nontender, nondistended. No organomegaly noted, normoactive bowel sounds. Musculoskeletal: No edema, cyanosis, or clubbing. Neuro: Alert,  answering all questions appropriately. Cranial nerves grossly intact. Skin: No rashes or petechiae noted. Psych: Normal affect. Lymphatics: No cervical, calvicular, axillary or inguinal LAD.    No Known Allergies:     pravastatin 80 mg oral tablet: 1 tab(s) orally once a day with evening meal, Status: Active, Quantity: 0, Refills: None   losartan 50 mg oral tablet: 1 tab(s) orally once a day, Status: Active, Quantity: 0, Refills: None   aspirin 325 mg oral tablet: 1 tab(s) orally once a day, Status: Active, Quantity: 0, Refills: None   prochlorperazine 10 mg oral tablet: 1  orally every 6 hours, As Needed, Status: Active, Quantity: 0, Refills: None  Laboratory Results:  Hepatic:  26-Mar-14 20:29   Bilirubin, Total 0.2  Alkaline Phosphatase 97  SGPT (ALT) 24  SGOT (AST)  10  Total Protein, Serum  6.1  Albumin, Serum  2.8  Routine Micro:  26-Mar-14 20:29   Micro Text Report BLOOD CULTURE   COMMENT                   NO GROWTH IN 8-12 HOURS   ANTIBIOTIC                       Culture Comment NO GROWTH IN 8-12 HOURS  Result(s) reported on 10 Dec 2012 at 08:17AM.  Routine Chem:  26-Mar-14 20:29   B-Type Natriuretic Peptide ([facility]) 88 (Result(s) reported on 09 Dec 2012 at 08:56PM.)  Result Comment WBC COUNT - RESULTS VERIFIED BY REPEAT TESTING.  - NOTIFIED OF CRITICAL VALUE  - NOTIFIED RN Jac Canavan AT 2100 ON  - 12/09/12 BY  [facility]  - READ-BACK PROCESS PERFORMED.  Result(s) reported on 09 Dec 2012 at 09:12PM.  Glucose, Serum  123  BUN 16  Creatinine (comp) 0.83  Sodium, Serum  134  Potassium, Serum 3.8  Chloride, Serum 100  CO2, Serum 28  Calcium (Total), Serum  8.0  Osmolality (calc) 271  eGFR (African American) >60  eGFR (Non-African American) >60 (eGFR values <53m/min/1.73 m2 may be an indication of chronic kidney disease (CKD). Calculated eGFR is useful in patients with stable renal function. The eGFR calculation will not be reliable in acutely ill patients when  serum creatinine is changing rapidly. It is not useful in  patients on dialysis. The eGFR calculation may not be applicable to patients at the low and high extremes of body sizes, pregnant women, and vegetarians.)  Anion Gap  6  Cardiac:  26-Mar-14 20:29   Troponin I < 0.02 (0.00-0.05 0.05 ng/mL or less: NEGATIVE  Repeat testing in 3-6 hrs  if clinically indicated. >0.05 ng/mL: POTENTIAL  MYOCARDIAL INJURY. Repeat  testing in 3-6 hrs if  clinically indicated. NOTE: An increase or decrease  of 30% or more on serial  testing suggests a  clinically important change)  CK, Total 40  CPK-MB, Serum  < 0.5 (Result(s) reported on 09 Dec 2012 at 08:56PM.)  Routine Hem:  26-Mar-14 20:29   WBC (CBC)  1.0  RBC (CBC)  3.40  Hemoglobin (CBC)  10.3  Hematocrit (CBC)  31.0  Platelet Count (CBC) 237  MCV 91  MCH 30.3  MCHC 33.2  RDW 14.0  Neutrophil % 27.2  Lymphocyte % 63.6  Monocyte % 5.6  Eosinophil % 1.0  Basophil % 2.6  Neutrophil #  0.3  Lymphocyte #  0.7  Monocyte #  0.1  Eosinophil # 0.0  Basophil # 0.0   Assessment and Plan: Impression:   Clinical stage I T-cell lymphoma Plan:   1.  T-cell lymphoma: Patient completed cycle 4 of 4 of CHOP chemotherapy on December 03, 2012.  He received Neulasta on March 21st.  Typically patient's WBCs nadir about 7-10 days after chemotherapy.  He does not require any further injections.  I expect his WBC count to start to increase in the next several days.  He has been instructed to keep his previously schedule follow appointments in ~3 weeks for restaging PET scan a foloowup in the [facility]  Neutropenia: Secondary to chemotherapy. No intervention needed at this time. consult.  Call with questions.  Electronic Signatures: FDelight Hoh(MD)  (Signed 27-Mar-14 11:59)  Authored: HISTORY OF PRESENT ILLNESS, PFSH, ROS, NURSING NOTES, PE, ALLERGIES, HOME MEDICATIONS, LABS, ASSESSMENT AND PLAN, CC Referring Physician   Last Updated:  27-Mar-14 11:59 by FDelight Hoh(MD)

## 2015-01-06 NOTE — H&P (Signed)
PATIENT NAME:  Erik Shelton, Erik Shelton MR#:  [id number] DATE OF BIRTH:  [birth year]  DATE OF ADMISSION:  12/10/2012  REFERRING PHYSICIAN:  Dr. Graciella Freer   PRIMARY CARE PHYSICIAN:  Dr. Frazier Richards   ONCOLOGIST:  Dr. 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 As well, patient reports receiving boost injection, most likely put on stimulating factor last Friday. Presents with complaints of chest pain with cough, cough with productive sputum, fever and chills, weak, and shortness of breath. In ED, patient was afebrile, saturating 96% on room air, but he was tachycardic initially with a heart rate of 134. There was a concern of PE, so the patient had CT chest with angiogram done which came back negative for PE, but did show some linear opacity concerning for atelectasis. The patient's tachycardia improved after receiving IV fluids; it dropped to the low 100s.  His EKG did show a normal sinus rhythm with ventricular rate of 98, with voltage criteria of LVH, but no significant ST or T wave changes. The patient reports his chest pain is mainly upon coughing. Denies any other provoking factor. Reported it has been for the last 3 days when his cough started. The patient had blood cultures sent in ED, and because of his neutropenia he was started on broad-spectrum antibiotics.  He received Fortaz and vancomycin in ED. Hospitalist service was requested to admit the patient for further evaluation and work-up of his chest pain, shortness of breath and neutropenia.   PAS MEDICAL HISTORY:   1.  Recent diagnosis of T-cell lymphoma with initial presentation of neck mass, currently on chemotherapy, followed by Dr. Grayland Ormond.  2.  History of  hypertension.  3.  Hyperlipidemia.  4.  History of intracranial hemorrhage.  5.  Questionable seizure and CVA history.   PAST SURGICAL HISTORY:  Recent Port-A-Cath insertion.   SOCIAL HISTORY:  The patient quit smoking in 1991. No history of alcohol or drug abuse.   FAMILY HISTORY:  No family history of cancer or hypertension.   ALLERGIES:  No known drug allergies.   HOME MEDICATIONS: 1.  Aspirin 325 mg oral daily.  2.  Losartan 50 mg oral daily.  3.  Pravastatin 80 mg oral at bedtime.  4.  Prochlorperazine 10 mg every 6 hours as needed for nausea.   REVIEW OF SYSTEMS: CONSTITUTIONAL:  The patient denies any fever, but complains of chills, fatigue, weakness.  EYES:  Denies blurry vision, double vision, pain, inflammation, glaucoma.  EARS, NOSE, THROAT:  Denies tinnitus, ear pain, hearing loss, epistaxis or discharge.  RESPIRATORY:  Complains of cough with productive yellow sputum.  Denies any hemoptysis.  Complains of dyspnea.  Denies any history of COPD.  CARDIOVASCULAR:  Has chest pain related to cough.  Denies any edema, arrhythmia, palpitations, syncope.  GASTROINTESTINAL:  Denies nausea, vomiting, diarrhea, abdominal pain, hematemesis, melena, rectal bleed, change in bowel habits.  GENITOURINARY:  Denies dysuria, hematuria, renal colic.  ENDOCRINE:  Denies polyuria, polydipsia, heat or cold intolerance.  HEMATOLOGY:  Denies anemia, easy bruising, bleeding diathesis.  INTEGUMENTARY:  Denies acne, rash or skin lesions.  MUSCULOSKELETAL:  Denies any gout, arthritis, cramps.  NEUROLOGICAL:  Denies any memory loss, dementia, ataxia, epilepsy, dysarthria, vertigo or tremors.  PSYCHIATRIC:  Denies any insomnia, nervousness, schizophrenia, bipolar  disorder, substance or alcohol abuse.   PHYSICAL EXAMINATION: VITAL SIGNS:  Temperature 97.4, pulse 111, respiratory rate 20, blood pressure 137/82, saturating 98% on oxygen.  GENERAL:  Chronically ill-appearing male looks comfortable in  bed in no apparent distress.  HEENT:  Has alopecia. Atraumatic, normocephalic.  Pupils are equal, reactive to light.  Pink conjunctivae.  Anicteric sclerae.  Moist oral mucosa.  No oral airway compromise can be noticed on the physical exam for inspection.  NECK:  Supple.  No thyromegaly.  No JVD.  No carotid bruits.  CHEST:  Had good air entry bilaterally.  No wheezing, rales, rhonchi.  CARDIOVASCULAR:  S1, S2 heard.  No rubs, murmur, gallops, tachycardic, but regular rhythm.  ABDOMEN:  Soft, nontender, nondistended.  Bowel sounds present.  EXTREMITIES:  No edema.  No clubbing.  No cyanosis. PSYCHIATRIC:  Appropriate affect.  Awake, alert x 3.  Intact judgment and insight.  NEUROLOGIC:  Cranial nerves grossly intact, motor 5 out of 5.  No focal deficits.  SKIN:  Normal skin turgor.  Warm and dry.   PERTINENT LABORATORY DATA:  BNP 88, glucose 123, BUN 16, creatinine 0.83, sodium 134, potassium 3.8, chloride 100, CO2 28, total protein 6.1, albumin 2.8.  Troponin less than 0.02.  White blood cells 1, hemoglobin 10.3, hematocrit 31, platelets 237, neutrophils 27%.  Urinalysis negative.  CT chest with IV contrast: No central or local pulmonary embolus identified.  Evaluation of the segmental pulmonary artery is limited with minimal linear opacities in the lingula, likely secondary to atelectasis.   ASSESSMENT AND PLAN:  This is a 74 year old male with history of T-cell lymphoma on chemotherapy presents with cough, productive sputum, tachycardia, shortness of breath.  CT is negative for pulmonary embolus, found to be neutropenic.  1.  Chest pain, appears to be atypical, noncardiac. This is most likely due to his cough. We will still continue to cycle patient's troponin,continue aspirin and statin.  2.  Neutropenia.  This is due to patient's chemotherapy, even though patient is febrile, but given his cough with productive purulent yellow sputum he will be started on broad-spectrum antibiotic. Already was  started on Fortaz and vancomycin in ED. Will be continued on IV vancomycin and Zosyn until his neutropenia resolves. We will consult oncology service, Dr. Grayland Ormond to evaluate the patient and he might need another injection of colony-stimulating factor to boost his neutropenia. Will be kept on neutropenic isolation.  3.  Cough with productive sputum. Patient's CT chest does not have significant evidence of pneumonia at this point, but given his neutropenia and tachycardia patient will be started on broad-spectrum antibiotics. Might need repeat chest x-ray at a later time to see if there is any developing opacity or an infiltrate. We will not give any other broad-spectrum antibiotics due to his neutropenia.  4.  Hypertension.  Will be continued on losartan.  5.  Hyperlipidemia.  Will be continued on statin.  6.  Deep vein thrombosis prophylaxis.  Subcutaneous heparin.   CODE STATUS:  Discussed with patient and family at bedside and he wishes to be a FULL CODE.   TOTAL TIME SPENT ON ADMISSION AND PATIENT CARE:  55 minutes.    ____________________________ Albertine Patricia, MD dse:ea D: 12/10/2012 01:28:33 ET T: 12/10/2012 02:00:00 ET JOB#: [id number]  cc: Albertine Patricia, MD, <Dictator> Vora Clover Graciela Husbands MD ELECTRONICALLY SIGNED 12/11/2012 7:16

## 2015-01-06 NOTE — Consult Note (Signed)
History of Present Illness:   Reason for Consult Right neck mass highly suspicious for underlying malignancy.    HPI   Patient is a 74 year old male who presented with hoarseness, dysphagia, and difficulty breathing that had been progressive over the past several months.  He otherwise has felt well.  He has no neurologic pains.  He denies any recent fevers or night sweats.  He admits to weight loss, but is unclear how much.  He denies any chest pain or shortness of breath.  He denies any nausea, vomiting, constipation, or diarrhea.  He has no melena or hematochezia.  He has no urinary complaints.  Patient otherwise feels well and offers no further specific complaints.  PFSH:   Additional Past Medical and Surgical History Hypertension, hyperlipidemia, intracranial hemorrhage, questionable seizure history.  Family history: Negative and noncontributory.  Social history: Previous tobacco, quit in 1991.  Denies alcohol.   Review of Systems:   Performance Status (ECOG) 0    Review of Systems   As per HPI. Otherwise, 10 point system review was negative.   NURSING NOTES: **Vital Signs.:   13-Jan-14 06:07    Vital Signs Type: Routine    Temperature Temperature (F): 98    Celsius: 36.6    Temperature Source: Oral    Pulse Pulse: 90    Respirations Respirations: 18    Systolic BP Systolic BP: 852    Diastolic BP (mmHg) Diastolic BP (mmHg): 85    Mean BP: 104    Pulse Ox % Pulse Ox %: 97    Pulse Ox Activity Level: At rest    Oxygen Delivery: Room Air/ 21 %   Physical Exam:   Physical Exam General: Well-developed, well-nourished, no acute distress. Eyes: Pink conjunctiva, anicteric sclera. HEENT: Mildly erythematous oropharnyx. Lungs: Clear to auscultation bilaterally. Heart: Regular rate and rhythm. No rubs, murmurs, or gallops. Abdomen: Soft, nontender, nondistended. No organomegaly noted, normoactive bowel sounds. Musculoskeletal: No edema, cyanosis, or  clubbing. Neuro: Alert, answering all questions appropriately. Cranial nerves grossly intact. Skin: No rashes or petechiae noted. Psych: Normal affect.    No Known Allergies:     pravastatin 80 mg oral tablet: 1 tab(s) orally once a day with evening meal, Active, 0, None   hydrochlorothiazide-losartan 12.5 mg-50 mg oral tablet: 1 tab(s) orally once a day, Active, 0, None   aspirin 81 mg oral tablet: 1 tab(s) orally once a day, Active, 0, None  Laboratory Results: Routine Chem:  13-Jan-14 05:13    Glucose, Serum  148   BUN 14   Creatinine (comp) 0.77   Sodium, Serum 140   Potassium, Serum 3.9   Chloride, Serum 106   CO2, Serum 29   Calcium (Total), Serum  8.4   Anion Gap  5   Osmolality (calc) 283   eGFR (African American) >60   eGFR (Non-African American) >60 (eGFR values <83m/min/1.73 m2 may be an indication of chronic kidney disease (CKD). Calculated eGFR is useful in patients with stable renal function. The eGFR calculation will not be reliable in acutely ill patients when serum creatinine is changing rapidly. It is not useful in  patients on dialysis. The eGFR calculation may not be applicable to patients at the low and high extremes of body sizes, pregnant women, and vegetarians.)  Routine Hem:  13-Jan-14 05:13    WBC (CBC)  12.6   RBC (CBC)  4.34   Hemoglobin (CBC) 13.3   Hematocrit (CBC) 40.5   Platelet Count (CBC) 297   MCV 93  MCH 30.7   MCHC 32.9   RDW 13.3   Neutrophil % 86.8   Lymphocyte % 10.8   Monocyte % 2.3   Eosinophil % 0.0   Basophil % 0.1   Neutrophil #  10.9   Lymphocyte # 1.4   Monocyte # 0.3   Eosinophil # 0.0   Basophil # 0.0 (Result(s) reported on 28 Sep 2012 at 05:58AM.)   Assessment and Plan:  Impression:   Right neck mass, highly suspicious for underlying malignancy.  Plan:   1.  Neck mass: Preliminary biopsy from Saturday indicates a possible lymphoid malignancy.  Will get PET scan tomorrow morning prior to discharge for  staging purposes.  Patient will then return to the [facility] on Friday, October 02, 2012 for discussion of his biopsy results and treatment planning if necessary.  If final pathology consistent with lymphoma, patient will also require bone marrow biopsy.  Both he and his wife expressed understanding and were in agreement with this plan. consult, call with questions.  Electronic Signatures: Delight Hoh (MD)  (Signed 13-Jan-14 13:36)  Authored: HISTORY OF PRESENT ILLNESS, PFSH, ROS, NURSING NOTES, PE, ALLERGIES, HOME MEDICATIONS, LABS, ASSESSMENT AND PLAN   Last Updated: 13-Jan-14 13:36 by Delight Hoh (MD)

## 2015-01-06 NOTE — Op Note (Signed)
PATIENT NAME:  Erik Shelton, Erik Shelton MR#:  [id number] DATE OF BIRTH:  02-02-1941  DATE OF PROCEDURE:  09/26/2012  SURGEON: Janalee Dane, M.D.   PREOPERATIVE DIAGNOSIS: Right oropharyngeal mass with possible peritonsillar abscess.   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 Willia Craze).   PROCEDURES: 1.  Direct laryngoscopy with multiple biopsies of the right tonsil.  2.  Nasopharyngoscopy.   DESCRIPTION OF PROCEDURE: The patient was placed in the supine position on the operating room table. After general endotracheal anesthesia had been induced, the patient was turned 90 degrees counterclockwise from anesthesia. Direct laryngoscopy was performed with the Dedo laryngoscope.  Palpation and visualization confirmed a large, firm mass displacing the uvula leftward with no extension beyond the base of the tongue and does not invade the base of the tongue. The larynx is widely patent, uninvolved and the vocal cords abduct and adduct visualized prior to intubation. Using the Dingman mouth retractor, multiple biopsies were taken with cautery and the tonsil tenaculum. Bleeding was controlled with Bovie electrocautery and lidocaine soaked pledgets. While awaiting touch prep, the nasopharynx was examined carefully with the 0 degrees Hopkins rod after topical anesthesia with fake cocaine was placed in the nose. The fake cocaine pledgets were placed back in the nose after nasopharyngoscopy had been completed. The nasopharyngoscopy was unremarkable. Slight bulging of the nasopharynx from inferolateral on the right side, but no appreciable involvement of the torus tubarius. Once confirmation that adequate tissue was available for appropriate diagnostic pathology testing, the patient was allowed to emerge from anesthesia, extubated in the Operating Room, and taken to the recovery room in stable condition.  No complications. Estimated blood loss 15 mL    ____________________________ J. Nadeen Landau, MD jmc:cc D: 09/26/2012 18:46:00 ET T: 09/27/2012 18:28:33 ET JOB#: [id number]  cc: Janalee Dane, MD, <Dictator> Nicholos Johns MD ELECTRONICALLY SIGNED 10/27/2012 19:26

## 2015-01-06 NOTE — Consult Note (Signed)
PATIENT NAME:  Erik Shelton, Erik Shelton MR#:  [id number] DATE OF BIRTH:  Apr 03, 1941  DATE OF CONSULTATION:  09/26/2012  CONSULTING PHYSICIAN:  Janalee Dane, MD  HISTORY OF PRESENT ILLNESS: The patient is a 74 year old Hispanic male who presented to the [facility] this morning, was evaluated by Dr. Gilford Raid, is a patient of Dr. Alphonzo Lemmings, and who has had problems with his throat since October. He began to feel fullness in the right side of his throat after he had a "cold" according to his wife, who speaks [place]. He got a little bit better after that according to his wife and has had some intermittent throat pain and referred otalgia but did relatively well until he went to [place] last week and on returning, he has noticed that the fullness has gotten much greater. The family has noticed that his voice has become more muffled and he has "snored a lot". He has been also been sleeping a lot, more than it typically does since he has returned back this week. The presenting diagnosis to the [facility] was peritonsillar abscess, however, when I evaluated him in the Emergency Room, it is apparent that there is an ulcerative, exophytic process posterior to the fullness in the tonsil that extends back into the posterolateral oropharynx. There is significant erythema lateral to this ulcerative process which may in fact be a peritonsillar abscess as well. During my evaluation I did inform the patient and his wife that this is possibly a cancer, and at that point the patient experienced a vasovagal reaction with some seizure activity and was subsequently evaluated by the Emergency Room physician.   ALLERGIES: No known medical allergies.   MEDICATIONS: Currently being entered by the triage nurse.   PAST MEDICAL HISTORY: Diabetes, hypertension, seizures.   EXAMINATION: The patient is currently responsive but unable to cooperate with drainage of peritonsillar abscess or a biopsy in the  Emergency Room. Oral cavity/oropharynx: Large ulcerative process posterior to the erythematous fullness in the right oropharynx. The ulceration extends down beyond the base of the tongue but the airway appears stable. Neck: There is no palpable lymphadenopathy. The trachea is midline.   IMPRESSION: Right oropharyngeal mass with posterior ulceration and exophytic component. 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 There does also appear to be an infectious component and it may in fact be superimposed on this likely carcinoma. My plan is to take the patient to the Operating Room today. I have spoken with Dr. Berton Lan, from Pathology, who will be available to perform to touch preps. I have spoken to the nursing supervisor who will arrange for the patient to be prepped for the Operating Room. I have also spoken with Dr. Netty Starring who has kindly agreed to admit the patient and manage his multiple medical issues. We will hold off on Hematology/Oncology consultation until we have confirmed the diagnosis of cancer. I will continue to follow the patient closely until his head and neck issues have been stabilized.    ____________________________ Lenna Sciara. Nadeen Landau, MD jmc:jm D: 09/26/2012 10:53:30 ET T: 09/26/2012 16:48:40 ET JOB#: [id number]  cc: Janalee Dane, MD, <Dictator> [facility] Nicholos Johns MD ELECTRONICALLY SIGNED 10/27/2012 19:26

## 2015-01-06 NOTE — Discharge Summary (Signed)
Dates of Admission and Diagnosis:  Date of Admission 10-Dec-2012   Date of Discharge 11-Dec-2012   Admitting Diagnosis neutropenia   Final Diagnosis neutropenia   Discharge Diagnosis 1 bronchitis   2 dehydration    Chief Complaint/History of Present Illness see h and p   Routine Chem:  28-Mar-14 04:36   Result Comment WBC - RESULTS VERIFIED BY REPEAT TESTING.  - CRITICAL VALUE PREVIOUSLY NOTIFIED.  - TPL  Result(s) reported on 11 Dec 2012 at 06:14AM.  Glucose, Serum  105  BUN 9  Creatinine (comp) 0.75  Sodium, Serum  134  Potassium, Serum 3.5  Chloride, Serum 101  CO2, Serum 29  Calcium (Total), Serum  8.1  Anion Gap  4  Osmolality (calc) 267  eGFR (African American) >60  eGFR (Non-African American) >60 (eGFR values <53m/min/1.73 m2 may be an indication of chronic kidney disease (CKD). Calculated eGFR is useful in patients with stable renal function. The eGFR calculation will not be reliable in acutely ill patients when serum creatinine is changing rapidly. It is not useful in  patients on dialysis. The eGFR calculation may not be applicable to patients at the low and high extremes of body sizes, pregnant women, and vegetarians.)  Routine Hem:  28-Mar-14 04:36   WBC (CBC)  1.9  RBC (CBC)  3.17  Hemoglobin (CBC)  10.4  Hematocrit (CBC)  29.5  Platelet Count (CBC) 220  MCV 93  MCH 32.6  MCHC 35.1  RDW 14.0  Neutrophil % 7.2  Lymphocyte % 57.6  Monocyte % 30.8  Eosinophil % 1.3  Basophil % 3.1  Neutrophil #  0.1  Lymphocyte # 1.1  Monocyte # 0.6  Eosinophil # 0.0  Basophil # 0.1   PERTINENT RADIOLOGY STUDIES: XRay:    27-Mar-14 09:15, Chest PA and Lateral  Chest PA and Lateral   REASON FOR EXAM:    cough  COMMENTS:       PROCEDURE: DXR - DXR CHEST PA (OR AP) AND LATERAL  - Dec 10 2012  9:15AM     RESULT: Comparison is made to the previous study of 09 December 2012. There   is a Port-A-Cath device present of the left chest with tip of the    catheter remaining in the brachiocephalic vein to the right of midline   near the junction with the superior vena cava. The lungs are clear. The   heart is borderline to mildly enlarged. There is no edema, effusion or   pneumothorax.    IMPRESSION:  No acute cardiopulmonary disease. Borderline cardiomegaly.    Dictation Site: 2    Verified By: GSundra Aland M.D., MD  CT:    26-Mar-14 21:21, CT Chest for Pulm Embolism With Contrast  CT Chest for Pulm Embolism With Contrast   REASON FOR EXAM:    cancer patient, dyspnea, tachycardia  COMMENTS:       PROCEDURE: CT  - CT CHEST (FOR PE) W  - Dec 09 2012  9:21PM     RESULT: Comparison: CT of the neck at 09/26/2012    Technique: Multiple thin section axial images were obtained from the lung   apices to the upper abdomen following 100 ml Isovue 370 intravenous   contrast, according to the PE protocol. These images were also reviewed   on a Siemens multiplanar work station.    Findings:   No mediastinal, hilar, or axillary lymphadenopathy. Calcifications are   seen in the coronary arteries. Exophytic low-attenuation lesion in the  right kidney is most consistent with a cyst.    The thoracic aorta is normal in caliber. Evaluation of the segmental   pulmonary arteries is limited secondary to contrast bolus timing. No   central or lobar pulmonary embolus identified. Minimal linear opacities   in the lingula are likely secondary to atelectasis. The central airways   are patent. 3 mm nodule in the right middle lobe is similar to recent   prior PET CT.    No aggressive lytic or sclerotic osseous lesions are identified.    IMPRESSION:   No central or lobar pulmonary embolus identified. Evaluation of the   segmental pulmonary arteries is limited.  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 He presented with cough and neutropenia and weakness. Cxr and ct without infiltrate. He was empirically treated with broad spetrum antibiotics and ivf and he rapidly improved and wbc improved the next day as he had previously been given colony stimulating factor. Onc saw him and only suggested outpatient follow up without more treatment being needed presently. Note all of dc tasks today took 35 minutes   Condition on Discharge Satisfactory   DISCHARGE INSTRUCTIONS HOME MEDS:  Medication Reconciliation: Patient's Home Medications at Discharge:     Medication Instructions  pravastatin 80 mg oral tablet  1 tab(s) orally once a day with evening meal   losartan 50 mg oral tablet  1 tab(s) orally once a day   aspirin 325 mg oral tablet  1 tab(s) orally once a day     Physician's Instructions:  Diet Regular   Activity Limitations As tolerated   Return to Work Not Applicable   Time frame for Follow Up Appointment per cancer center   Electronic Signatures: [name] (MD)  (Signed 28-Mar-14 07:39)  Authored: ADMISSION DATE AND DIAGNOSIS, CHIEF COMPLAINT/HPI, PERTINENT LABS, PERTINENT RADIOLOGY STUDIES, HOSPITAL COURSE, DISCHARGE INSTRUCTIONS HOME MEDS, PATIENT INSTRUCTIONS   Last Updated: 28-Mar-14 07:39 by [name] (MD)

## 2015-01-06 NOTE — Discharge Summary (Signed)
PATIENT NAME:  Erik Shelton, Erik Shelton MR#:  [id number] DATE OF BIRTH:  Jan 01, 1941  DATE OF ADMISSION:  09/26/2012 DATE OF DISCHARGE:  09/29/2012  DISCHARGE DIAGNOSES: 1. Posterior pharyngeal mass, likely malignant. 2. Type 2 diabetes.  3. Syncope, likely vasovagal.  4. Hyperlipidemia.   DISCHARGE MEDICATIONS: Per med reconciliation.   HOSPITAL COURSE: The patient was admitted by the medical service after having syncope when told he had a likely malignant lesion in his posterior pharynx. He had no other issues with that and cardiac function, et Ronney Asters was fine. This was felt by multiple doctors to be vasovagal given the situation. He did well otherwise. He had a biopsy done which showed lymphoproliferative changes on the original biopsy results, although final results are known. Dr. Grayland Ormond was consulted as well, after biopsy by Dr. Carlis Abbott. He is ordered a PET scan for today. The patient will go home after that and follow up with Dr. Grayland Ormond for further evaluation and treatment going forward. Basically he will be on his regular medicines. I in fact he is having a lot of pain he can call me or Dr. Carlis Abbott for further analgesics. His glucose was relatively well controlled in the 110 to 140 range, in the hospital, although notably he was on IV steroids while here to decrease swelling as originally he had some dysphagia and difficulty breathing, although after biopsy there is much more room in his posterior pharynx so was not recommended by oncology to go home with this, per their notes. Mild leukocytosis that was noted on admission, at approximately 11,000, was stable throughout in that same range.            TIME SPENT: It took approximately 35 minutes to do discharge tasks including evaluating the patient, following up on chart and various notes from consultants, doing med reconciliation, etc.  ____________________________ Ocie Cornfield. Ouida Sills, MD mwa:sb D: 09/29/2012 08:03:00 ET T: 09/29/2012  08:35:25 ET JOB#: [id number]  cc: Ocie Cornfield. Ouida Sills, MD, <Dictator> [name] MD ELECTRONICALLY SIGNED 09/30/2012 7:42

## 2015-01-06 NOTE — Op Note (Signed)
PATIENT NAME:  Erik Shelton, Erik Shelton MR#:  [id number] DATE OF BIRTH:  10-21-1940  DATE OF PROCEDURE:  10/08/2012  PREOPERATIVE DIAGNOSES:  1.  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  2.  Hypertension.  3.  Hyperlipidemia.   POSTOPERATIVE DIAGNOSES:  1.  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  2.  Hypertension.  3.  Hyperlipidemia.   PROCEDURES: 1.  Ultrasound guidance for vascular access, left jugular vein.  2.  Fluoroscopic guidance for placement of catheter.  3.  Placement of a CT compatible Port-A-Cath, left jugular vein.   SURGEON: Algernon Huxley, M.D.   ANESTHESIA: Local with moderate conscious sedation.   ESTIMATED BLOOD LOSS: Minimal.   FLUOROSCOPY TIME: Less than 1 minute.   CONTRAST USED: None.   INDICATION FOR PROCEDURE: A 74 year old Hispanic male with lymphoma needs a Port-A-Cath for chemotherapy.   DESCRIPTION OF PROCEDURE: The patient was brought to the vascular radiology suite. Left neck and chest were sterilely prepped and draped, a sterile surgical field was created. The left jugular vein was accessed under direct ultrasound guidance with minimal difficulty with a Seldinger needle and permanent image was recorded. A J-wire was placed. After skin nick and dilatation, the peel-away sheath was placed over the wire. I turned my attention to the left subclavicular region. Just below the left clavicle, I anesthetized an area and created a transverse incision. An inferior pocket was created, and the port was secured with 2 Prolene sutures to the chest wall.  The catheter was connected to the port, tunnelled from the subclavicular incision to the access site. Fluoroscopic guidance then used to cut the catheter to an appropriate length. The catheter was placed through the peel-away sheath and the peel-away sheath was removed. The catheter tip terminated in the innominate vein just shy of the superior vena cava. It withdrew blood well and flushed easily with heparinized saline. The  pocket was irrigated with antibiotic-impregnated saline and closed with a 3-0 Vicryl and 4-0 Monocryl. The access incision was closed with a single 4-0 Monocryl. Dermabond was placed as a dressing. The patient tolerated the procedure well and was taken to the recovery room in stable condition.  ____________________________ Algernon Huxley, MD jsd:dm D: 10/08/2012 10:06:10 ET T: 10/08/2012 10:21:04 ET JOB#: [id number]  cc: Algernon Huxley, MD, <Dictator> Algernon Huxley MD ELECTRONICALLY SIGNED 10/12/2012 9:12

## 2015-01-06 NOTE — Consult Note (Signed)
Reason for Visit: This 74 year old Male patient presents to the clinic for initial evaluation of  non-Hodgkin's lymphoma .   Referred by Dr. Grayland Ormond.  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 Tumor was confined to the upper neck and tonsillar region.lumbar puncture and bone marrow biopsy failed to show any evidence of disease in the CNS or marrow. He was deemed a stage I disease and is undergone 4 cycles of CHOP chemotherapy. Did have a recent hospitalization for neutropenia and bronchitis. He is recovering well from that at this time. Repeat PET/CT scan showed resolution of the hypermetabolic activity in the right tonsillar region. There was a small focus of activity in the left side angle of mandible may be denture related. Present time he is doing well. No head and neck pain no dysphagia. I've asked to evaluate the patient for possibility of involved field radiation.  Past Hx:    T-Cell Lymphoma:    T-Cell Lymphoma:    CVA/Stroke:    Seizures:    Hypercholesterolemia:    HTN:   Past, Family and Social History:  Past Medical History positive   Cardiovascular hyperlipidemia; hypertension   Neurological/Psychiatric history of intracranial bleed and possible seizure disorder   Family History noncontributory   Social History positive   Social History Comments greater than 40-pack-year smoking history quit smoking 1991   Additional Past Medical and  Surgical History accompanied by his wife today   Allergies:   No Known Allergies:   Home Meds:  Home Medications: Medication Instructions Status  pravastatin 80 mg oral tablet 1 tab(s) orally once a day with evening meal Active  losartan 50 mg oral tablet 1 tab(s) orally once a day Active  aspirin 325 mg oral tablet 1 tab(s) orally once a day Active   Review of Systems:  General negative   Performance Status (ECOG) 0   Skin negative   Breast negative   Ophthalmologic negative   ENMT negative   Respiratory and Thorax negative   Cardiovascular negative   Gastrointestinal negative   Genitourinary negative   Musculoskeletal negative   Neurological negative   Psychiatric negative   Hematology/Lymphatics negative   Endocrine negative   Allergic/Immunologic negative   Review of Systems   according to the nurse's notesPatient denies any weight loss, fatigue, weakness, fever, chills or night sweats. Patient denies any loss of vision, blurred vision. Patient denies any ringing  of the ears or hearing loss. No irregular heartbeat. Patient denies heart murmur or history of fainting. Patient denies any chest pain or pain radiating to her upper extremities. Patient denies any shortness of breath, difficulty breathing at night, cough or hemoptysis. Patient denies any swelling in the lower legs. Patient denies any nausea vomiting, vomiting of blood, or coffee ground material in the vomitus. Patient denies any stomach pain. Patient states has had normal bowel movements no significant constipation or diarrhea. Patient denies any dysuria, hematuria or significant nocturia. Patient denies any problems walking, swelling in the joints or  loss of balance. Patient denies any skin changes, loss of hair or loss of weight. Patient denies any excessive worrying or anxiety or significant depression. Patient denies any problems with insomnia. Patient denies excessive thirst, polyuria, polydipsia.  Patient denies any swollen glands, patient denies easy bruising or easy bleeding. Patient denies any recent infections, allergies or URI. Patient "s visual fields have not changed significantly in recent time.  Nursing Notes:  Nursing Vital Signs and Chemo Nursing Nursing Notes: *CC Vital Signs Flowsheet:   11-Apr-14 08:52  Temp Temperature 96.6  Pulse Pulse 133  Respirations Respirations 20  SBP SBP 155  DBP DBP 96  Pain Scale (0-10)  0  Current Weight (kg) (kg) 87.5  Height (cm) centimeters 174  BSA (m2) 2   Physical Exam:  General/Skin/HEENT:  General normal   Skin normal   Eyes normal   ENMT normal   Head and Neck normal   Additional PE well-developed male in NAD. Oral cavity is clear patient is edentulous and wears upper and lower dentures. Bilateral tonsillar fossa as are clear. Indirect mirror examination shows upper airway clear cords approximating well vallecula and base of tongue within normal limits. No evidence of some digastric submental cervical or supraclavicular adenopathy is identified. Lungs are clear to A&P cardiac examination shows regular rate and rhythm. Abdomen is benign. No peripheral adenopathy is identified.   Breasts/Resp/CV/GI/GU:  Respiratory and Thorax normal   Cardiovascular normal   Gastrointestinal normal   Genitourinary normal   MS/Neuro/Psych/Lymph:  Musculoskeletal normal   Neurological normal   Lymphatics normal   Other Results:  Radiology Results: LabUnknown:    14-Jan-14 12:02, PET/CT Scan Lymphoma Initial Staging  PACS Image     08-Apr-14 14:24, PET/CT Scan Lymphoma Restaging  PACS Image   Nuclear Med:    14-Jan-14 12:02, PET/CT Scan Lymphoma Initial Staging  PET/CT Scan Lymphoma Initial Staging   REASON FOR EXAM:    neck mass, biospy consistant with "lymphoid neoplasm."  COMMENTS:       PROCEDURE: PET - PET/CT INIT STAGING LYMPHOMA  - Sep 29 2012 12:02PM     RESULT: Comparison: CT of the neck and chest  09/26/2012    Radiopharmaceutical: 12.24 mCi F18-FDG, intravenously.    Technique: Imaging was performed from the skull base to the mid-thigh   using routine PET/CT acquisition protocol.    Injection site: Right wrist  Time of FDG injection: 1020 hours  Serum glucose: 122 mg/dL   Time of imaging: 1120 hours through 1145 hours    Findings:  There is a large hypermetabolic mass associated with the right tonsillar   pillar. It measures SUV max 10.7. There is mild activity associated with   the left tonsillar pillar, which is nonspecific. There is a small focus   of mildly increased radiotracer activity just adjacent to the left side   of the thyroid cartilage. This is nonspecific. No definite abnormality   seen on the CT images. A small hypermetabolic lymph lymph node was be   difficultto exclude.     There is mild radiotracer activity within the region of the right   sternoclavicular joint, which is likely degenerative. No mediastinal,   hilar, or axillary hypermetabolic lymph nodes. There is a 4 mm nodule in   the anterior right middle lobe. This is below the resolution of PET. Mild     basilar opacities are likely secondary to atelectasis.     No foci of abnormal hypermetabolic activity identified within the abdomen  or pelvis. There is physiologic radiotracer activity within the urinary   system and bowel. There are multiple low-attenuation lesions in the   kidneys which likely represent cysts. Mild thickening of the bladder wall   is likely secondary to underdistention.    IMPRESSION:   1. Hypermetabolic mass associated with the right tonsillar pillar. There   is mild metabolic activity associated with the left tonsillar pillar,   which is nonspecific.  2. Small focus of increased activity adjacent to the left thyroid   cartilage is nonspecific. A small hypermetabolic lymph node be difficult   to exclude.  3. No hypermetabolic lymph nodes identified in the chest, abdomen,  or   pelvis.  4. Indeterminate 4 mm nodule in the right middle lobe.        Verified By: Gregor Hams, M.D., MD    08-Apr-14 14:24, PET/CT Scan Lymphoma Restaging  PET/CT Scan Lymphoma Restaging   REASON FOR EXAM:    T Cell Lymphoma Restaging  COMMENTS:       PROCEDURE: PET - PET/CT RESTAGING LYMPHOMA  - Dec 22 2012  2:24PM     RESULT: Comparison: PET CT 09/29/2012    Radiopharmaceutical: 12.72 mCi F18-FDG, intravenously.    Technique: Imaging was performed from the skull base to the mid-thigh   using routine PET/CT acquisition protocol.    Injection site: Left hand  Time of FDG injection: 1253 hours  Serum glucose: 124 mg/dL   Time of imaging: 1357 hours through 1422 hours    Findings:  There has been complete resolution of the hypermetabolic activity   associated with the mass in the right tonsillar pillar. Mild   hypermetabolic activity in the left tonsillar pillar is similar to   slightly decreased from prior. There is a small focusof mild metabolic   activity just posterior to the angle of the left side of the mandible.   This is new from prior. It is nonspecific, but could represent a small   focus of activity within a lymph node. There is asymmetric increased   activity in the right side of the hypopharynx at the level of the   superior aspect of the thyroid cartilage. This is nonspecific. There is   near complete opacification of the paranasal sinuses, which is new from   prior. There is mild to moderate opacification of the ethmoid air cells.  No hypermetabolic mediastinal, hilar, or axillary lymph nodes identified.   3 mm nodule in the right upper lobe is similar to prior. 3 mm nodule in   the right middle lobe is similar to prior.    No abnormal foci of hypermetabolic activity identified in the abdomen or   pelvis. Exophytic low-attenuation lesions in the kidneys likely represent   cysts. Circumferential thickening of the bladder wall is likely secondary   to  underdistention. There are scattered diverticula throughout the colon,   particularly in the sigmoid colon.    IMPRESSION:   1. Interval resolution of hypermetabolic activity in the right tonsillar   mass. The mass has essentially resolved, as well.  2. Small focus of hypermetabolic activity just posterior to the left side   of the angle of the mandible  is new from prior. This could represent a     small focus of activity within a lymph node, which is nonspecific  3. New, asymmetric mild hypermetabolic activity along the right side of   the hypopharynx at the level of the thyroid cartilage is nonspecific.  Direct visualization is recommended.        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 Ma Hillock. We feel at this time we can go ahead with involved field radiation. He's had a complete response by PET CT criteria after initial CHOP for 4 cycles. According to [facility] guidelines would like to go ahead with involved field radiation therapy. I would like to use IMRT treatment planning and delivery to spare his normal structures such as salvage glands oral cavity and spinal cord. Would treat the primary area of involvement to 4000 cGy over 4 weeks treating his residual initial uninvolved neck nodes up to 3000 cGy using IMRT treatment planning and delivery and dose painting technique Risks and benefits of treatment including creasing dysphasia, possible alteration of taste, possible xerostomia, skin irritation and alteration blood counts all explained in detail to the patient and his wife. I have set him up for CT simulation in about a week's time.  I would like to take this opportunity  to thank you for allowing me to continue to participate in this patient's care.  CC Referral:  cc: Dr. Frazier Richards   Electronic Signatures: Baruch Gouty, Roda Shutters (MD)  (Signed 11-Apr-14 09:54)  Authored: HPI, Diagnosis, Past Hx, PFSH, Allergies, Home Meds, ROS, Nursing Notes, Physical Exam, Other Results, Relevent Results, Encounter Assessment and Plan, CC Referring Physician   Last Updated: 11-Apr-14 09:54 by Armstead Peaks (MD)

## 2015-01-07 NOTE — Op Note (Signed)
PATIENT NAME:  Erik Shelton, Erik Shelton MR#:  [id number] DATE OF BIRTH:  August 18, 1941  DATE OF PROCEDURE:  06/09/2014  PREOPERATIVE DIAGNOSES:  1. Lymphoma, no longer using his Port-A-Cath. 2. Hypertension.  3. Hyperlipidemia.   POSTOPERATIVE DIAGNOSES:  1. Lymphoma, no longer using his Port-A-Cath. 2. Hypertension.  3. Hyperlipidemia.   PROCEDURE: Removal of left jugular Port-A-Cath.   SURGEON: Algernon Huxley, M.D.   ANESTHESIA: Local with monitored conscious sedation.   ESTIMATED BLOOD LOSS: Minimal.   INDICATION FOR PROCEDURE: A 74 year old male with history of T-cell lymphoma. He is no longer using his Port-A-Cath and this can be removed.   DESCRIPTION OF PROCEDURE: The patient was brought to the vascular suite. The left chest was sterilely prepped and draped and a sterile surgical field was created. The area was copiously anesthetized with 1% lidocaine. The previous incision was reopened and the port and catheter were dissected out. The catheter was then removed in its entirety without difficulty. The port was dissected free from the fibrous-surrounding sheath, and the Prolene sutures that were securing it in place were removed and the port and catheter were removed in their entirety. The wound was closed with 3-0 Vicryl and 4-0 Monocryl. Dermabond was placed as a dressing. The patient tolerated the procedure well and was taken to the recovery room in stable condition.     ____________________________ Algernon Huxley, MD jsd:JT D: 06/09/2014 10:02:43 ET T: 06/09/2014 10:43:50 ET JOB#: [id number]  cc: Algernon Huxley, MD, <Dictator> Ocie Cornfield. Ouida Sills, MD Algernon Huxley MD ELECTRONICALLY SIGNED 06/28/2014 10:20

## 2015-01-08 NOTE — H&P (Signed)
PATIENT NAME:  Erik Shelton, Erik Shelton MR#:  [id number] DATE OF BIRTH:  August 19, 1941  DATE OF ADMISSION:  11/29/2011  REFERRING PHYSICIAN:  Dr. Roxine Caddy PRIMARY CARE PHYSICIAN: Dr. Frazier Richards   REASON FOR ADMISSION: Cerebrovascular accident.   HISTORY OF PRESENT ILLNESS: This is pleasant 74 year old [place] male with past medical history of hypercholesteremia, hypertension, and hemorrhagic cerebrovascular accident who presents with numbness in the right side of his face, right arm and leg which began at around 9:00 a.m. one day prior to admission. He went to bed. He said initially it was just his lips and his right arm and then it became his legs when he went to bed and he had difficulty sleeping. He woke up this morning having numbness in his legs, arm, and lips. He denied any chest pain, shortness of breath, nausea, vomiting, diarrhea, fevers, or shakes. He took aspirin yesterday. He said it feels like ants are crawling on his arms and legs. He had a head CT and labs. Tylenol was given here in the Emergency Room.   PRIMARY CARE PHYSICIAN: Dr. Frazier Richards   PAST MEDICAL HISTORY:  1. Hypertension.  2. Seizure disorder. He is off antiseizure medications.  3. Hypercholesteremia.  4. 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   PAST SURGICAL HISTORY: None.   MEDICATIONS:  1. Losartan/hydrochlorothiazide 50/12.5 mg daily.  2. Pravastatin 80 mg daily.   DRUG ALLERGIES: No known drug allergies.   SOCIAL HISTORY: He has two children. He quit smoking in 1991, retired Architect. Originally from [place]. He moved here from [place] four years ago.   FAMILY HISTORY: No cancer, hypertension. Not well known about his parents as he did not know them.  He was raised by his grandparents.  REVIEW OF SYSTEMS: CONSTITUTIONAL: No fever, fatigue, or weakness. EYES: No blurred vision, double vision, pain, redness, inflammation, glaucoma. He does wear  glasses.  ENT: No tinnitus, ear pain, hearing loss, seasonal allergies, epistaxis, or discharge. RESPIRATORY: No cough, wheezing, hemoptysis, dyspnea, asthma, or painful respirations. CARDIOVASCULAR: No chest pain, orthopnea, edema, arrhythmia, dyspnea on exertion, or palpitations. GI: No nausea, vomiting, diarrhea, abdominal pain, hematemesis, melena, or gastroesophageal reflux disease. GU: No dysuria, hematuria, renal calculi, frequency, or incontinence. GU/male: No sores, discharge, or prostatitis. ENDOCRINE: No polyuria, nocturia, or thyroid problems. No increased sweating or heat or cold intolerance. HEME/LYMPH: No anemia, easy bruising, bleeding, or swollen glands. INTEGUMENT: No acne, rash, change in mole, hair, or skin. MUSCULOSKELETAL:  No pain in back, shoulders, or knees. No arthritis, swelling, or gout. NEUROLOGIC: No numbness, weakness, dysarthria, epilepsy, tremor, vertigo, or ataxia. PSYCH: No anxiety, insomnia, ADD, bipolar, or depression.   PHYSICAL EXAMINATION:  VITAL SIGNS: The patient's febrile with temperature of 96.8, Erik Shelton rate 87, blood pressure 112/82, sating 99% on room air, respiratory rate 18.  Originally blood pressure was 153/83 on admission.  GENERAL: The patient is well-developed, well-nourished, in no apparent distress.  Alert and oriented times three.   HEENT: Pupils equal and reactive to light and accommodation. Extraocular movements are intact. Anicteric sclerae. No difficulty hearing. Oropharynx clear.   NECK: No JVD. No thyromegaly. No lymphadenopathy. No carotid bruits.   LUNGS: Clear to auscultation. No adventitious breath sounds. No increased effort or use of accessory muscles.   CARDIOVASCULAR: Regular rate and rhythm. Normal S1, S2. No murmurs, gallops, or rubs  appreciated.   EXTREMITIES: No lower extremity edema. PMI is not lateralized. 2+ dorsalis pedis pulses.   BREASTS: No obvious masses.   ABDOMEN:  Soft, nontender, nondistended. Positive bowel  sounds.   GU: Deferred.   MUSCULOSKELETAL:  No cyanosis, degenerative joint disease, or kyphosis.  Strength 5/5 in the left side, 4/5 right upper extremity, right lower and left lower extremities 5/5.   SKIN: No rashes, lesions, or induration. Warm to touch.  LYMPH: No lymphadenopathy in the cervical or supraclavicular area.   NEUROLOGIC: Cranial nerves II through XII are intact. Strength 4 to 5 in the right upper extremity, 5/5 in all other extremities. +2 reflexes. Able to do finger-to-nose and heel-to-shin. Has some difficulty with fine motor as per ER physician but I do not see that. His gait is steady. He still has decreased sensation in his right upper extremity and decreased sensation in his right lip.  No cerebellar dysfunction.  PSYCH: Alert and oriented times three with good judgment.   LABORATORY, DIAGNOSTIC, AND RADIOLOGICAL DATA: Glucose 118, BUN 16, creatinine 1.06, sodium 138, potassium 2.5, chloride 98, bicarbonate 28, anion gap 12, total protein 8.6, albumin 4.1, total bilirubin 0.7, alkaline phosphatase 66, AST 24, ALT 34. Troponin is less than is 0.2. WBC 10.4, hemoglobin 15.8, hematocrit 46.5, platelets 270, MCV 95. Urinalysis shows WBC 1, bacteria none, leukocyte esterase nitrate negative. Head CT shows no acute intracranial process but there are some chronic changes suggestive of small vessel ischemia. Chest x-ray shows no acute changes identified. There is atelectasis right middle lobe or lingula.    ASSESSMENT AND PLAN: This is a pleasant 74 year old [place] male with past medical history of hemorrhagic stroke, hypercholesterolemia, and hypertension who presents with right-sided weakness.  1. Right-sided numbness/ cerebrovascular accident:  We missed the TPA window. Would continue statin. He received aspirin yesterday.  I tried calling Dr. Jennings Books, the neurologist on call, but not able to reach him at this point. We will try later. Looking at the CT there is no  bleeding so I will go ahead and give him one dose of aspirin. The patient already took a  dose of aspirin yesterday. We will allow for blood pressure elevation for central nervous system perfusion and get neuro checks and telemetry. Cycle cardiac enzymes. We will get carotids, MRI, and echo. In addition, we will get PT/OT evaluation.  2. Hypertension: We will allow for elevation. It has dropped on its own. Wrote for p.r.n. labetalol if blood pressure gets greater than 180.  3. Hypercholesteremia: We will continue with high-dose statin.  4. Hyperglycemia: We will check A1c.  5. Deep vein thrombosis prophylaxis:  Maintain with TED stockings and SCDs.  6. CODE STATUS: FULL CODE.  The patient's significant other was by the bedside and agreeable to the plan.   TOTAL TIME SPENT ON ADMISSION: 55 minutes.   ____________________________ Judeth Horn Royden Purl, MD aaf:bjt D: 11/29/2011 11:39:16 ET T: 11/29/2011 12:52:33 ET JOB#: [id number]  cc: Mike Craze A. Royden Purl, MD, <Dictator> Ocie Cornfield. Ouida Sills, MD Joaquin Bend MD ELECTRONICALLY SIGNED 11/29/2011 16:32

## 2015-01-08 NOTE — Discharge Summary (Signed)
PATIENT NAME:  RAYDELL, MANERS MR#:  [id number] DATE OF BIRTH:  December 17, 1940  DATE OF ADMISSION:  11/29/2011 DATE OF DISCHARGE:  12/01/2011  DISCHARGE DIAGNOSES:  1. Acute left thalamic infarct.  2. Hypertension.  3. Hyperlipidemia.  4. History of intracranial hemorrhage 2010.   DISCHARGE MEDICATIONS:  1. Losartan//HCT 50/12.5 daily.  2. Pravastatin 80 mg at bedtime.  3. Aspirin 325 mg daily.   REASON FOR ADMISSION: 74 year old male presents with right arm and right facial numbness. Please see history and physical for history of present illness, past medical history, physical exam.   HOSPITAL COURSE: Patient was admitted, found to be in normal sinus rhythm. Carotid Doppler showed less than 50% stenosis bilaterally. LDL 61. His symptoms improved by the end of hospitalization with minimal right facial numbness residual. MRI did show a punctate left thalamic infarct. He was stable for discharge. He will be on a full aspirin for two weeks then down to baby aspirin 81 mg daily. No rehab needed. Follow up with Dr. Ouida Sills in one week.   ____________________________ Rusty Aus, MD mfm:cms D: 12/01/2011 08:09:11 ET T: 12/02/2011 11:49:26 ET JOB#: [id number]  cc: Rusty Aus, MD, <Dictator> MARK Roselee Culver MD ELECTRONICALLY SIGNED 12/04/2011 7:43

## 2015-01-20 ENCOUNTER — Encounter: Payer: Self-pay | Admitting: Radiation Oncology

## 2015-01-20 ENCOUNTER — Ambulatory Visit: Payer: Self-pay | Admitting: Radiation Oncology

## 2015-01-20 ENCOUNTER — Ambulatory Visit
Admission: RE | Admit: 2015-01-20 | Discharge: 2015-01-20 | Disposition: A | Discharge status: Home / Self Care | Payer: Medicare Other | Source: Ambulatory Visit | Attending: Radiation Oncology | Admitting: Radiation Oncology

## 2015-01-20 VITALS — BP 141/88 | HR 88 | Temp 97.0°F | Resp 20 | Wt 207.2 lb

## 2015-01-20 DIAGNOSIS — C8591 Non-Hodgkin lymphoma, unspecified, lymph nodes of head, face, and neck: Secondary | ICD-10-CM

## 2015-01-20 NOTE — Progress Notes (Signed)
Radiation Oncology Follow up Note  Name: Erik Shelton   Date:   01/20/2015 MRN:  [id number] DOB: 1941/03/03    This 74 y.o. male presents to the clinic today for follow up. 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 He is seen today in routine follow-up and is doing well. Still has some slight xerostomia. No dysphagia or head and neck pain. Recently had CT scan performed March 20 50,016 showing no evidence of residual or recurrent lymphadenopathy in the neck.  COMPLICATIONS OF TREATMENT: none  FOLLOW UP COMPLIANCE: keeps appointments   PHYSICAL EXAM:  BP 141/88 mmHg  Pulse 88  Temp(Src) 97 F (36.1 C)  Resp 20  Wt 207 lb 3.7 oz (94 kg) Well-developed well-nourished male in NAD. Oral cavity is clear tonsil regions are within normal limits. No oral mucosal lesions are identified. Indirect mirror examination shows upper airway clear vallecula and base of tongue within normal limits. Neck is clear without evidence of subject gastric cervical or supraclavicular adenopathy. Lungs are clear to A&P cardiac examination shows regular rate and rhythm. Well-developed well-nourished patient in NAD. HEENT reveals PERLA, EOMI, discs not visualized.  Oral cavity is clear. No oral mucosal lesions are identified. Neck is clear without evidence of cervical or supraclavicular adenopathy. Lungs are clear to A&P. Cardiac examination is essentially unremarkable with regular rate and rhythm without murmur rub or thrill. Abdomen is benign with no organomegaly or masses noted. Motor sensory and DTR levels are equal and symmetric in the upper and lower extremities. Cranial nerves II  through XII are grossly intact. Proprioception is intact. No peripheral adenopathy or edema is identified. No motor or sensory levels are noted. Crude visual fields are within normal range.   RADIOLOGY RESULTS: CT scan is reviewed and compatible with the above-stated findings.  PLAN: At this time patient continues to do well with no evidence of disease. I am pleased with his overall progress. I have asked to see him back in 1 year for follow-up. Patient knows to call with any concerns.  I would like to take this opportunity for allowing me to participate in the care of your patient.Armstead Peaks., MD

## 2015-04-05 ENCOUNTER — Other Ambulatory Visit: Payer: Self-pay | Admitting: Oncology

## 2015-04-05 ENCOUNTER — Telehealth: Payer: Self-pay | Admitting: *Deleted

## 2015-04-05 DIAGNOSIS — C8441 Peripheral T-cell lymphoma, not classified, lymph nodes of head, face, and neck: Secondary | ICD-10-CM | POA: Insufficient documentation

## 2015-04-05 NOTE — Telephone Encounter (Addendum)
For past week having daily evening fevers with night sweats adn h/a stomach upset after he eats Per Dr Grayland Ormond he will order some imaging. Called Lynda back and advised her of this and that she will get a call from schedulers

## 2015-04-10 ENCOUNTER — Ambulatory Visit
Admission: RE | Admit: 2015-04-10 | Discharge: 2015-04-10 | Disposition: A | Discharge status: Home / Self Care | Payer: Medicare Other | Source: Ambulatory Visit | Attending: Oncology | Admitting: Oncology

## 2015-04-10 ENCOUNTER — Telehealth: Payer: Self-pay | Admitting: *Deleted

## 2015-04-10 ENCOUNTER — Encounter: Payer: Self-pay | Admitting: Emergency Medicine

## 2015-04-10 ENCOUNTER — Emergency Department
Admission: EM | Admit: 2015-04-10 | Discharge: 2015-04-10 | Disposition: A | Discharge status: Home / Self Care | Payer: Medicare Other | Attending: Emergency Medicine | Admitting: Emergency Medicine

## 2015-04-10 ENCOUNTER — Ambulatory Visit: Admission: RE | Admit: 2015-04-10 | Payer: Medicare Other | Source: Ambulatory Visit

## 2015-04-10 DIAGNOSIS — R112 Nausea with vomiting, unspecified: Secondary | ICD-10-CM | POA: Insufficient documentation

## 2015-04-10 DIAGNOSIS — Z8572 Personal history of non-Hodgkin lymphomas: Secondary | ICD-10-CM

## 2015-04-10 DIAGNOSIS — R1013 Epigastric pain: Secondary | ICD-10-CM | POA: Diagnosis present

## 2015-04-10 DIAGNOSIS — I10 Essential (primary) hypertension: Secondary | ICD-10-CM | POA: Insufficient documentation

## 2015-04-10 DIAGNOSIS — Z87891 Personal history of nicotine dependence: Secondary | ICD-10-CM | POA: Diagnosis not present

## 2015-04-10 DIAGNOSIS — Z79899 Other long term (current) drug therapy: Secondary | ICD-10-CM | POA: Insufficient documentation

## 2015-04-10 DIAGNOSIS — C8441 Peripheral T-cell lymphoma, not classified, lymph nodes of head, face, and neck: Secondary | ICD-10-CM | POA: Insufficient documentation

## 2015-04-10 DIAGNOSIS — C8590 Non-Hodgkin lymphoma, unspecified, unspecified site: Secondary | ICD-10-CM

## 2015-04-10 DIAGNOSIS — Z7982 Long term (current) use of aspirin: Secondary | ICD-10-CM | POA: Diagnosis not present

## 2015-04-10 DIAGNOSIS — K5730 Diverticulosis of large intestine without perforation or abscess without bleeding: Secondary | ICD-10-CM | POA: Insufficient documentation

## 2015-04-10 DIAGNOSIS — R591 Generalized enlarged lymph nodes: Secondary | ICD-10-CM

## 2015-04-10 DIAGNOSIS — I2510 Atherosclerotic heart disease of native coronary artery without angina pectoris: Secondary | ICD-10-CM | POA: Insufficient documentation

## 2015-04-10 LAB — URINALYSIS COMPLETE WITH MICROSCOPIC (ARMC ONLY)
BACTERIA UA: NONE SEEN
Bilirubin Urine: NEGATIVE
GLUCOSE, UA: NEGATIVE mg/dL
HGB URINE DIPSTICK: NEGATIVE
Ketones, ur: NEGATIVE mg/dL
Leukocytes, UA: NEGATIVE
Nitrite: NEGATIVE
PH: 5 (ref 5.0–8.0)
PROTEIN: NEGATIVE mg/dL
Specific Gravity, Urine: >1.06 — ABNORMAL HIGH (ref 1.005–1.030)

## 2015-04-10 LAB — COMPREHENSIVE METABOLIC PANEL
ALBUMIN: 3.5 g/dL (ref 3.5–5.0)
ALK PHOS: 294 U/L — AB (ref 38–126)
ALT: 78 U/L — AB (ref 17–63)
ANION GAP: 7 (ref 5–15)
AST: 56 U/L — AB (ref 15–41)
BUN: 12 mg/dL (ref 6–20)
CHLORIDE: 99 mmol/L — AB (ref 101–111)
CO2: 27 mmol/L (ref 22–32)
CREATININE: 1 mg/dL (ref 0.61–1.24)
Calcium: 8.7 mg/dL — ABNORMAL LOW (ref 8.9–10.3)
GFR calc Af Amer: >60 mL/min (ref >60)
GLUCOSE: 116 mg/dL — AB (ref 65–99)
Potassium: 4.4 mmol/L (ref 3.5–5.1)
Sodium: 133 mmol/L — ABNORMAL LOW (ref 135–145)
TOTAL PROTEIN: 6.8 g/dL (ref 6.5–8.1)
Total Bilirubin: 0.7 mg/dL (ref 0.3–1.2)

## 2015-04-10 LAB — CBC
HCT: 41.7 % (ref 40.0–52.0)
HEMOGLOBIN: 13.7 g/dL (ref 13.0–18.0)
MCH: 30.5 pg (ref 26.0–34.0)
MCHC: 32.9 g/dL (ref 32.0–36.0)
MCV: 92.7 fL (ref 80.0–100.0)
Platelets: 333 10*3/uL (ref 150–440)
RBC: 4.49 MIL/uL (ref 4.40–5.90)
RDW: 14.2 % (ref 11.5–14.5)
WBC: 9.5 10*3/uL (ref 3.8–10.6)

## 2015-04-10 LAB — LIPASE, BLOOD: LIPASE: 23 U/L (ref 22–51)

## 2015-04-10 MED ORDER — OXYCODONE-ACETAMINOPHEN 5-325 MG PO TABS: 1.0000 | ORAL_TABLET | ORAL | Status: DC | PRN

## 2015-04-10 MED ORDER — IOHEXOL 350 MG/ML SOLN
100.0000 mL | Freq: Once | INTRAVENOUS | Status: AC | PRN
  Administered 2015-04-10: 100 mL via INTRAVENOUS

## 2015-04-10 NOTE — ED Notes (Signed)
Pt to ed with c/o abd pain, bloating, nausea, weakness, vomiting, fever x 2 weeks.  Pt reports pain has progressively gotten worse.

## 2015-04-10 NOTE — ED Provider Notes (Signed)
[facility] Emergency Department Provider Note   ____________________________________________  Time seen: 1250  I have reviewed the triage vital signs and the nursing notes.   HISTORY  Chief Complaint Abdominal Pain   History limited by: Not Limited   HPI Erik Shelton is a 74 y.o. male who presents to the emergency department with concerns for abdominal pain, bloating, nausea, weakness and some vomiting and fever for the past 2 weeks. Symptoms have been gradually getting worse. Patient describes his pain as being located in the epigastric and right upper quadrant. He states that the pain is worse after eating and he has had some associated vomiting. He is noted to have low-grade fever at home. He talked with his cancer doctor who ordered a CT scan earlier today.     Past Medical History  Diagnosis Date  . Stroke   . Seizures   . Hypercholesteremia   . HTN (hypertension)   . Cancer     T-Cell Lymphoma    Patient Active Problem List   Diagnosis Date Noted  . 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 No pertinent past surgical history.  Current Outpatient Rx  Name  Route  Sig  Dispense  Refill  . aspirin 81 MG tablet   Oral   Take 81 mg by mouth daily.         Marland Kitchen losartan (COZAAR) 50 MG tablet   Oral   Take 50 mg by mouth daily.         . pravastatin (PRAVACHOL) 80 MG tablet   Oral   Take 80 mg by mouth daily.           Allergies No known allergies  History reviewed. No pertinent family history.  Social History History  Substance Use Topics  . Smoking status: Former Research scientist (life sciences)  . Smokeless tobacco: Not on file     Comment: Quit  in 1990  . Alcohol Use: Yes     Comment: Occassional glass of wine    Review of Systems  Constitutional: positive for fever. Cardiovascular: Negative for chest pain. Respiratory: Negative for shortness of breath. Gastrointestinal: positive for  abdominal pain, vomiting Genitourinary: Negative for dysuria. Musculoskeletal: Negative for back pain. Skin: Negative for rash. Neurological: Negative for headaches, focal weakness or numbness.   10-point ROS otherwise negative.  ____________________________________________   PHYSICAL EXAM:  VITAL SIGNS: ED Triage Vitals  Enc Vitals Group     BP 04/10/15 0955 139/76 mmHg     Pulse Rate 04/10/15 0955 105     Resp 04/10/15 0955 20     Temp 04/10/15 0955 98.2 F (36.8 C)     Temp Source 04/10/15 0955 Oral     SpO2 04/10/15 0955 93 %     Weight 04/10/15 0955 205 lb (92.987 kg)     Height 04/10/15 0955 5\' 11"  (1.803 m)     Head Cir --      Peak Flow --      Pain Score 04/10/15 0956 8   Constitutional: Alert and oriented. Well appearing and in no distress. Eyes: Conjunctivae are normal. PERRL. Normal extraocular movements. ENT   Head: Normocephalic and atraumatic.   Nose: No congestion/rhinnorhea.   Mouth/Throat: Mucous membranes are moist.   Neck: No stridor. Hematological/Lymphatic/Immunilogical: No cervical lymphadenopathy. Cardiovascular: Normal rate, regular rhythm.  No murmurs, rubs, or gallops. Respiratory: Normal respiratory effort without tachypnea nor retractions. Breath sounds are clear and equal bilaterally. No wheezes/rales/rhonchi.  Gastrointestinal: Soft and nontender. No distention. There is no CVA tenderness. Genitourinary: Deferred Musculoskeletal: Normal range of motion in all extremities. No joint effusions.  No lower extremity tenderness nor edema. Neurologic:  Normal speech and language. No gross focal neurologic deficits are appreciated. Speech is normal.  Skin:  Skin is warm, dry and intact. No rash noted. Psychiatric: Mood and affect are normal. Speech and behavior are normal. Patient exhibits appropriate insight and judgment.  ____________________________________________    LABS (pertinent positives/negatives)  Labs Reviewed   COMPREHENSIVE METABOLIC PANEL - Abnormal; Notable for the following:    Sodium 133 (*)    Chloride 99 (*)    Glucose, Bld 116 (*)    Calcium 8.7 (*)    AST 56 (*)    ALT 78 (*)    Alkaline Phosphatase 294 (*)    All other components within normal limits  URINALYSIS COMPLETEWITH MICROSCOPIC ([facility] ONLY) - Abnormal; Notable for the following:    Color, Urine YELLOW (*)    APPearance CLEAR (*)    Specific Gravity, Urine >1.060 (*)    Squamous Epithelial / LPF 0-5 (*)    All other components within normal limits  LIPASE, BLOOD  CBC     ____________________________________________   EKG  None  ____________________________________________    RADIOLOGY  CT abdomen and pelvis from earlier today IMPRESSION: 1. Extensive lymphadenopathy throughout the chest, abdomen and pelvis, as above, compatible with recurrent lymphoma. 2. Asymmetric somewhat mass-like enlargement in the region of the left pharyngeal tonsil. Correlation with direct inspection is recommended to exclude the possibility of underlying neoplasm. 3. Atherosclerosis, including left main and 2 vessel coronary artery disease. Assessment for potential risk factor modification, dietary therapy or pharmacologic therapy may be warranted, if clinically indicated. 4. Colonic diverticulosis without evidence of acute diverticulitis at this time. 5. Additional incidental findings, as above.  ____________________________________________   PROCEDURES  Procedure(s) performed: None  Critical Care performed: No  ____________________________________________   INITIAL IMPRESSION / ASSESSMENT AND PLAN / ED COURSE  Pertinent labs & imaging results that were available during my care of the patient were reviewed by me and considered in my medical decision making (see chart for details).  Patient presents to the emergency department with abdominal pain as well as other symptoms for the past 2 weeks. CT scan from earlier  today unfortunately shows potential recurrence of lymphoma. Gallbladder was not distended and blood work today without significantly concerning findings. At this point I think it likely that many of the patient's symptoms are secondary to the recurrence of lymphoma. I did discuss this with patient and family and will have patient follow-up with oncologist later this week.  ____________________________________________   FINAL CLINICAL IMPRESSION(S) / ED DIAGNOSES  Final diagnoses:  Lymphoma     [name], MD 04/10/15 [phone]

## 2015-04-10 NOTE — Discharge Instructions (Signed)
Please seek medical attention for any high fevers, chest pain, shortness of breath, change in behavior, persistent vomiting, bloody stool or any other new or concerning symptoms. Abdominal Pain Many things can cause abdominal pain. Usually, abdominal pain is not caused by a disease and will improve without treatment. It can often be observed and treated at home. Your health care provider will do a physical exam and possibly order blood tests and X-rays to help determine the seriousness of your pain. However, in many cases, more time must pass before a clear cause of the pain can be found. Before that point, your health care provider may not know if you need more testing or further treatment. HOME CARE INSTRUCTIONS  Monitor your abdominal pain for any changes. The following actions may help to alleviate any discomfort you are experiencing:  Only take over-the-counter or prescription medicines as directed by your health care provider.  Do not take laxatives unless directed to do so by your health care provider.  Try a clear liquid diet (broth, tea, or water) as directed by your health care provider. Slowly move to a bland diet as tolerated. SEEK MEDICAL CARE IF:  You have unexplained abdominal pain.  You have abdominal pain associated with nausea or diarrhea.  You have pain when you urinate or have a bowel movement.  You experience abdominal pain that wakes you in the night.  You have abdominal pain that is worsened or improved by eating food.  You have abdominal pain that is worsened with eating fatty foods.  You have a fever. SEEK IMMEDIATE MEDICAL CARE IF:   Your pain does not go away within 2 hours.  You keep throwing up (vomiting).  Your pain is felt only in portions of the abdomen, such as the right side or the left lower portion of the abdomen.  You pass bloody or black tarry stools. MAKE SURE YOU:  Understand these instructions.   Will watch your condition.   Will get  help right away if you are not doing well or get worse.  Document Released: 06/12/2005 Document Revised: 09/07/2013 Document Reviewed: 05/12/2013 [facility] Patient Information 2015 [place], [place]. This information is not intended to replace advice given to you by your health care provider. Make sure you discuss any questions you have with your health care provider.

## 2015-04-10 NOTE — ED Notes (Signed)
Triage note done by Sharon Seller RN.

## 2015-04-10 NOTE — Telephone Encounter (Signed)
Thanks.  He needs f/u with me asap.  This week.

## 2015-04-10 NOTE — Telephone Encounter (Signed)
Schedule message sent for patient to see Woodfin Ganja this week.

## 2015-04-10 NOTE — Telephone Encounter (Signed)
Was informed that pt is in the ER.Marland KitchenMarland KitchenHad CT scan this morning.Marland KitchenMarland Kitchen

## 2015-04-13 ENCOUNTER — Observation Stay
Admission: AD | Admit: 2015-04-13 | Discharge: 2015-04-15 | Disposition: A | Discharge status: Home / Self Care | Payer: Medicare Other | Source: Ambulatory Visit | Attending: Oncology | Admitting: Oncology

## 2015-04-13 ENCOUNTER — Ambulatory Visit: Payer: Medicare Other

## 2015-04-13 ENCOUNTER — Inpatient Hospital Stay: Payer: Medicare Other | Attending: Oncology

## 2015-04-13 ENCOUNTER — Inpatient Hospital Stay: Payer: Medicare Other | Admitting: Oncology

## 2015-04-13 VITALS — BP 165/93 | HR 94 | Temp 99.3°F | Resp 20 | Wt 202.0 lb

## 2015-04-13 DIAGNOSIS — C8448 Peripheral T-cell lymphoma, not classified, lymph nodes of multiple sites: Secondary | ICD-10-CM

## 2015-04-13 DIAGNOSIS — R61 Generalized hyperhidrosis: Secondary | ICD-10-CM

## 2015-04-13 DIAGNOSIS — C8441 Peripheral T-cell lymphoma, not classified, lymph nodes of head, face, and neck: Secondary | ICD-10-CM

## 2015-04-13 DIAGNOSIS — K5730 Diverticulosis of large intestine without perforation or abscess without bleeding: Secondary | ICD-10-CM | POA: Insufficient documentation

## 2015-04-13 DIAGNOSIS — C8330 Diffuse large B-cell lymphoma, unspecified site: Secondary | ICD-10-CM | POA: Diagnosis not present

## 2015-04-13 DIAGNOSIS — R109 Unspecified abdominal pain: Secondary | ICD-10-CM | POA: Diagnosis not present

## 2015-04-13 DIAGNOSIS — Z87891 Personal history of nicotine dependence: Secondary | ICD-10-CM | POA: Insufficient documentation

## 2015-04-13 DIAGNOSIS — Z8572 Personal history of non-Hodgkin lymphomas: Secondary | ICD-10-CM | POA: Diagnosis not present

## 2015-04-13 DIAGNOSIS — R5383 Other fatigue: Secondary | ICD-10-CM

## 2015-04-13 DIAGNOSIS — I10 Essential (primary) hypertension: Secondary | ICD-10-CM | POA: Diagnosis not present

## 2015-04-13 DIAGNOSIS — R52 Pain, unspecified: Secondary | ICD-10-CM | POA: Diagnosis present

## 2015-04-13 DIAGNOSIS — I2510 Atherosclerotic heart disease of native coronary artery without angina pectoris: Secondary | ICD-10-CM | POA: Insufficient documentation

## 2015-04-13 DIAGNOSIS — R569 Unspecified convulsions: Secondary | ICD-10-CM | POA: Insufficient documentation

## 2015-04-13 DIAGNOSIS — R531 Weakness: Secondary | ICD-10-CM

## 2015-04-13 DIAGNOSIS — Z8673 Personal history of transient ischemic attack (TIA), and cerebral infarction without residual deficits: Secondary | ICD-10-CM | POA: Diagnosis not present

## 2015-04-13 DIAGNOSIS — R112 Nausea with vomiting, unspecified: Secondary | ICD-10-CM | POA: Diagnosis present

## 2015-04-13 DIAGNOSIS — Z79899 Other long term (current) drug therapy: Secondary | ICD-10-CM | POA: Insufficient documentation

## 2015-04-13 DIAGNOSIS — E780 Pure hypercholesterolemia: Secondary | ICD-10-CM | POA: Insufficient documentation

## 2015-04-13 DIAGNOSIS — I700 Atherosclerosis of aorta: Secondary | ICD-10-CM | POA: Diagnosis not present

## 2015-04-13 DIAGNOSIS — Z9221 Personal history of antineoplastic chemotherapy: Secondary | ICD-10-CM | POA: Diagnosis not present

## 2015-04-13 DIAGNOSIS — C84A8 Cutaneous T-cell lymphoma, unspecified, lymph nodes of multiple sites: Secondary | ICD-10-CM | POA: Diagnosis not present

## 2015-04-13 DIAGNOSIS — R630 Anorexia: Secondary | ICD-10-CM

## 2015-04-13 DIAGNOSIS — R5381 Other malaise: Secondary | ICD-10-CM

## 2015-04-13 DIAGNOSIS — Z923 Personal history of irradiation: Secondary | ICD-10-CM | POA: Insufficient documentation

## 2015-04-13 DIAGNOSIS — R509 Fever, unspecified: Secondary | ICD-10-CM | POA: Diagnosis not present

## 2015-04-13 DIAGNOSIS — Z7401 Bed confinement status: Secondary | ICD-10-CM | POA: Diagnosis not present

## 2015-04-13 LAB — COMPREHENSIVE METABOLIC PANEL
ALT: 95 U/L — ABNORMAL HIGH (ref 17–63)
ANION GAP: 9 (ref 5–15)
AST: 53 U/L — AB (ref 15–41)
Albumin: 3.7 g/dL (ref 3.5–5.0)
Alkaline Phosphatase: 352 U/L — ABNORMAL HIGH (ref 38–126)
BILIRUBIN TOTAL: 1.4 mg/dL — AB (ref 0.3–1.2)
BUN: 11 mg/dL (ref 6–20)
CALCIUM: 8.3 mg/dL — AB (ref 8.9–10.3)
CHLORIDE: 97 mmol/L — AB (ref 101–111)
CO2: 26 mmol/L (ref 22–32)
CREATININE: 1.01 mg/dL (ref 0.61–1.24)
GFR calc Af Amer: >60 mL/min (ref >60)
GFR calc non Af Amer: >60 mL/min (ref >60)
Glucose, Bld: 105 mg/dL — ABNORMAL HIGH (ref 65–99)
POTASSIUM: 3.8 mmol/L (ref 3.5–5.1)
Sodium: 132 mmol/L — ABNORMAL LOW (ref 135–145)
Total Protein: 7.1 g/dL (ref 6.5–8.1)

## 2015-04-13 LAB — CBC WITH DIFFERENTIAL/PLATELET
Basophils Absolute: 0.1 10*3/uL (ref 0–0.1)
Basophils Relative: 1 %
EOS ABS: 1.6 10*3/uL — AB (ref 0–0.7)
Eosinophils Relative: 17 %
HCT: 43.3 % (ref 40.0–52.0)
Hemoglobin: 14 g/dL (ref 13.0–18.0)
LYMPHS ABS: 0.7 10*3/uL — AB (ref 1.0–3.6)
LYMPHS PCT: 7 %
MCH: 29.7 pg (ref 26.0–34.0)
MCHC: 32.2 g/dL (ref 32.0–36.0)
MCV: 92.1 fL (ref 80.0–100.0)
MONO ABS: 0.8 10*3/uL (ref 0.2–1.0)
MONOS PCT: 9 %
Neutro Abs: 6.1 10*3/uL (ref 1.4–6.5)
Neutrophils Relative %: 66 %
PLATELETS: 362 10*3/uL (ref 150–440)
RBC: 4.7 MIL/uL (ref 4.40–5.90)
RDW: 13.9 % (ref 11.5–14.5)
WBC: 9.2 10*3/uL (ref 3.8–10.6)

## 2015-04-13 LAB — SAMPLE TO BLOOD BANK

## 2015-04-13 LAB — LACTATE DEHYDROGENASE: LDH: 423 U/L — ABNORMAL HIGH (ref 98–192)

## 2015-04-13 MED ORDER — LOSARTAN POTASSIUM 50 MG PO TABS
50.0000 mg | ORAL_TABLET | Freq: Every day | ORAL | Status: DC
  Administered 2015-04-14 – 2015-04-15 (×2): 50 mg via ORAL
  Filled 2015-04-13 (×2): qty 1

## 2015-04-13 MED ORDER — ACETAMINOPHEN 325 MG PO TABS: 650.0000 mg | ORAL_TABLET | ORAL | Status: DC | PRN

## 2015-04-13 MED ORDER — ONDANSETRON HCL 4 MG PO TABS: 4.0000 mg | ORAL_TABLET | Freq: Three times a day (TID) | ORAL | Status: DC | PRN

## 2015-04-13 MED ORDER — ONDANSETRON HCL 4 MG/2ML IJ SOLN: 4.0000 mg | Freq: Three times a day (TID) | INTRAMUSCULAR | Status: DC | PRN

## 2015-04-13 MED ORDER — SODIUM CHLORIDE 0.9 % IV SOLN: 250.0000 mL | INTRAVENOUS | Status: DC | PRN

## 2015-04-13 MED ORDER — SODIUM CHLORIDE 0.9 % IV SOLN: 8.0000 mg | Freq: Three times a day (TID) | INTRAVENOUS | Status: DC | PRN

## 2015-04-13 MED ORDER — SENNOSIDES-DOCUSATE SODIUM 8.6-50 MG PO TABS: 1.0000 | ORAL_TABLET | Freq: Every evening | ORAL | Status: DC | PRN

## 2015-04-13 MED ORDER — ACETAMINOPHEN 325 MG PO TABS
650.0000 mg | ORAL_TABLET | ORAL | Status: DC | PRN
  Administered 2015-04-13 – 2015-04-14 (×2): 650 mg via ORAL
  Filled 2015-04-13 (×2): qty 2

## 2015-04-13 MED ORDER — ALUM & MAG HYDROXIDE-SIMETH 200-200-20 MG/5ML PO SUSP: 60.0000 mL | ORAL | Status: DC | PRN

## 2015-04-13 MED ORDER — ONDANSETRON HCL 40 MG/20ML IJ SOLN: 4.0000 mg | Freq: Three times a day (TID) | INTRAMUSCULAR | Status: DC | PRN

## 2015-04-13 MED ORDER — GUAIFENESIN-DM 100-10 MG/5ML PO SYRP: 10.0000 mL | ORAL_SOLUTION | ORAL | Status: DC | PRN

## 2015-04-13 MED ORDER — HYDROCORTISONE 2.5 % RE CREA: 1.0000 "application " | TOPICAL_CREAM | Freq: Two times a day (BID) | RECTAL | Status: DC | PRN

## 2015-04-13 MED ORDER — OXYCODONE-ACETAMINOPHEN 5-325 MG PO TABS: 1.0000 | ORAL_TABLET | ORAL | Status: DC | PRN

## 2015-04-13 MED ORDER — ENOXAPARIN SODIUM 40 MG/0.4ML ~~LOC~~ SOLN: 40.0000 mg | SUBCUTANEOUS | Status: DC

## 2015-04-13 MED ORDER — ONDANSETRON 8 MG PO TBDP: 4.0000 mg | ORAL_TABLET | Freq: Three times a day (TID) | ORAL | Status: DC | PRN

## 2015-04-13 MED ORDER — MORPHINE SULFATE 2 MG/ML IJ SOLN
2.0000 mg | INTRAMUSCULAR | Status: DC | PRN
  Administered 2015-04-15: 11:00:00 2 mg via INTRAVENOUS
  Filled 2015-04-13: qty 1

## 2015-04-13 MED ORDER — ONDANSETRON 4 MG PO TBDP
4.0000 mg | ORAL_TABLET | Freq: Three times a day (TID) | ORAL | Status: DC | PRN
  Filled 2015-04-13: qty 2

## 2015-04-13 MED ORDER — SODIUM CHLORIDE 0.9 % IJ SOLN: 3.0000 mL | INTRAMUSCULAR | Status: DC | PRN

## 2015-04-13 MED ORDER — SODIUM CHLORIDE 0.9 % IV SOLN
INTRAVENOUS | Status: DC
  Administered 2015-04-13 – 2015-04-15 (×4): via INTRAVENOUS

## 2015-04-13 MED ORDER — OXYCODONE-ACETAMINOPHEN 5-325 MG PO TABS
1.0000 | ORAL_TABLET | Freq: Once | ORAL | Status: AC
  Administered 2015-04-13: 1 via ORAL
  Filled 2015-04-13: qty 1

## 2015-04-13 MED ORDER — HYDROCORTISONE 2.5 % RE CREA
1.0000 "application " | TOPICAL_CREAM | Freq: Two times a day (BID) | RECTAL | Status: DC | PRN
  Filled 2015-04-13: qty 28.35

## 2015-04-13 MED ORDER — SODIUM CHLORIDE 0.9 % IJ SOLN
3.0000 mL | Freq: Two times a day (BID) | INTRAMUSCULAR | Status: DC
  Administered 2015-04-13: 22:00:00 3 mL via INTRAVENOUS

## 2015-04-13 NOTE — Progress Notes (Signed)
Patient here today for follow up regarding CT scan results, lymphoma. Patient reports increasing pain, shortness of breath, fevers and night sweats.

## 2015-04-13 NOTE — H&P (Signed)
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  INTERVAL HISTORY: Patient is a 74 year old male with the above history of lymphoma who completed initial chemotherapy in March 2014. He got involved field radiation of his left neck in April of May 2014. He did his usual state of health until approximately 2 weeks ago when he noted increasing pain and lethargy. His performance status is also significantly declined over the past several weeks. He has no neurologic complaints. He does admit to fevers and night sweats. He has no chest pain or shortness of breath. He has a poor appetite, but denies any nausea, vomiting, constipation, or diarrhea. He has no urinary complaints. Patient feels generally terrible, but offers no further specific complaints.  REVIEW OF SYSTEMS:   Review of Systems  Constitutional: Positive for fever, weight loss, malaise/fatigue and diaphoresis.  Respiratory: Negative.   Cardiovascular: Negative.   Gastrointestinal: Negative.   Neurological: Positive for weakness.    As per HPI. Otherwise, a complete review of systems is negatve.  PAST MEDICAL HISTORY: Past Medical History  Diagnosis Date  . Stroke   . Seizures   . Hypercholesteremia   . HTN (hypertension)   . Cancer     T-Cell Lymphoma    PAST SURGICAL HISTORY: No past surgical history on file.  FAMILY HISTORY:  Reviewed and unchanged. No reported history of malignancy or chronic disease.     ADVANCED DIRECTIVES:    HEALTH MAINTENANCE: History  Substance Use Topics  . Smoking status: Former Research scientist (life sciences)  . Smokeless tobacco: Not on file     Comment: Quit  in 1990  . Alcohol Use: Yes     Comment: Occassional glass of wine      Colonoscopy:  PAP:  Bone density:  Lipid panel:  Allergies  Allergen Reactions  . No Known Allergies     Current Facility-Administered Medications  Medication Dose Route Frequency Provider Last Rate Last Dose  . 0.9 %  sodium chloride infusion  250 mL Intravenous PRN Lloyd Huger, MD      . 0.9 %  sodium chloride infusion   Intravenous Continuous Lloyd Huger, MD      . acetaminophen (TYLENOL) tablet 650 mg  650 mg Oral Q4H PRN Lloyd Huger, MD      . alum & mag hydroxide-simeth (MAALOX/MYLANTA) 200-200-20 MG/5ML suspension 60 mL  60 mL Oral Q4H PRN Lloyd Huger, MD      . enoxaparin (LOVENOX) injection 40 mg  40 mg Subcutaneous Q24H Lloyd Huger, MD      . guaiFENesin-dextromethorphan (ROBITUSSIN DM) 100-10 MG/5ML syrup 10 mL  10 mL Oral Q4H PRN Lloyd Huger, MD      . hydrocortisone (ANUSOL-HC) 2.5 % rectal cream 1 application  1 application Rectal BID PRN Lloyd Huger, MD      . losartan (COZAAR) tablet 50 mg  50 mg Oral Daily Lloyd Huger, MD      . morphine 2 MG/ML injection 2 mg  2 mg Intravenous Q2H PRN Lloyd Huger, MD      . ondansetron [facility]) tablet 4-8 mg  4-8 mg Oral Q8H PRN Lloyd Huger, MD       Or  . ondansetron (ZOFRAN-ODT) disintegrating tablet 4-8 mg  4-8 mg  Oral Q8H PRN Lloyd Huger, MD       Or  . ondansetron [facility]) injection 4 mg  4 mg Intravenous Q8H PRN Lloyd Huger, MD       Or  . ondansetron (ZOFRAN) 8 mg in sodium chloride 0.9 % 50 mL IVPB  8 mg Intravenous Q8H PRN Lloyd Huger, MD      . oxyCODONE-acetaminophen (PERCOCET/ROXICET) 5-325 MG per tablet 1 tablet  1 tablet Oral Q4H PRN Lloyd Huger, MD      . senna-docusate (Senokot-S) tablet 1 tablet  1 tablet Oral QHS PRN Lloyd Huger, MD      . sodium chloride 0.9 % injection 3 mL  3 mL Intravenous Q12H Lloyd Huger, MD      . sodium chloride 0.9 % injection 3 mL  3 mL Intravenous PRN Lloyd Huger,  MD       Facility-Administered Medications Ordered in Other Encounters  Medication Dose Route Frequency Provider Last Rate Last Dose  . acetaminophen (TYLENOL) tablet 650 mg  650 mg Oral Q4H PRN Lloyd Huger, MD      . alum & mag hydroxide-simeth (MAALOX/MYLANTA) 200-200-20 MG/5ML suspension 60 mL  60 mL Oral Q4H PRN Lloyd Huger, MD      . enoxaparin (LOVENOX) injection 40 mg  40 mg Subcutaneous Q24H Lloyd Huger, MD      . guaiFENesin-dextromethorphan (ROBITUSSIN DM) 100-10 MG/5ML syrup 10 mL  10 mL Oral Q4H PRN Lloyd Huger, MD      . hydrocortisone (ANUSOL-HC) 2.5 % rectal cream 1 application  1 application Rectal BID PRN Lloyd Huger, MD        OBJECTIVE: There were no vitals filed for this visit.   There is no weight on file to calculate BMI.    ECOG FS:2 - Symptomatic, <50% confined to bed  General: Well-developed, well-nourished, no acute distress. Eyes: Pink conjunctiva, anicteric sclera. HEENT: Normocephalic, moist mucous membranes, clear oropharnyx. Lungs: Clear to auscultation bilaterally. Heart: Regular rate and rhythm. No rubs, murmurs, or gallops. Abdomen: Soft, nontender, nondistended. No organomegaly noted, normoactive bowel sounds. Musculoskeletal: No edema, cyanosis, or clubbing. Neuro: Alert, answering all questions appropriately. Cranial nerves grossly intact. Skin: No rashes or petechiae noted. Psych: Normal affect. Lymphatics: Easily palpable right axillary lymphadenopathy.   LAB RESULTS:  Lab Results  Component Value Date   NA 132* 04/13/2015   K 3.8 04/13/2015   CL 97* 04/13/2015   CO2 26 04/13/2015   GLUCOSE 105* 04/13/2015   BUN 11 04/13/2015   CREATININE 1.01 04/13/2015   CALCIUM 8.3* 04/13/2015   PROT 7.1 04/13/2015   ALBUMIN 3.7 04/13/2015   AST 53* 04/13/2015   ALT 95* 04/13/2015   ALKPHOS 352* 04/13/2015   BILITOT 1.4* 04/13/2015   GFRNONAA >60 04/13/2015   GFRAA >60 04/13/2015    Lab Results  Component  Value Date   WBC 9.2 04/13/2015   NEUTROABS 6.1 04/13/2015   HGB 14.0 04/13/2015   HCT 43.3 04/13/2015   MCV 92.1 04/13/2015   PLT 362 04/13/2015     STUDIES: Ct Soft Tissue Neck W Contrast  04/10/2015   CLINICAL DATA:  Subsequent evaluation of a 74 year old male with history of lymphoma diagnosed 2 years ago status post chemotherapy and radiation therapy, complaining of weakness, nausea, vomiting, shortness of breath and chest pain for 1 week.  EXAM: CT OF THE NECK WITH CONTRAST  CT OF THE CHEST WITH CONTRAST  CT OF THE  ABDOMEN AND PELVIS WITH CONTRAST  TECHNIQUE: Multidetector CT imaging of the neck was performed with intravenous contrast.; Multidetector CT imaging of the abdomen and pelvis was performed following the standard protocol during bolus administration of intravenous contrast.; Multidetector CT imaging of the chest was performed following the standard protocol during bolus administration of intravenous contrast.  CONTRAST:  164mL OMNIPAQUE IOHEXOL 350 MG/ML SOLN  COMPARISON:  PET-CT 12/22/2012  FINDINGS: CT NECK FINDINGS  Multiple prominent but nonenlarged lymph nodes are noted in the cervical regions bilaterally, largest of which is a right-sided submandibular lymph node measuring up to 8 mm in short axis. These have a normal appearing fatty hila, and are considered benign. Asymmetric somewhat mass-like appearing area in the region of the left pharyngeal tonsil, difficult to measure separate from the adjacent uvula, and partially obscured by beam hardening artifact from dental hardware.  CT CHEST FINDINGS  Mediastinum/Lymph Nodes: Heart size is normal. There is no significant pericardial fluid, thickening or pericardial calcification. There is atherosclerosis of the thoracic aorta, the great vessels of the mediastinum and the coronary arteries, including calcified atherosclerotic plaque in the left main, left anterior descending and right coronary arteries. Mildly enlarged low right  paratracheal lymph node measuring 1 cm in short axis. Borderline enlarged AP window lymph node measuring 9 mm in short axis. Mildly enlarged lymph nodes in the hilar regions bilaterally measuring up to 1 cm. Esophagus is normal in appearance. Extensive bilateral axillary and subpectoral lymphadenopathy (Right greater than left) measuring up to 2.5 cm in the right axilla.  Lungs/Pleura: Mild scarring in the inferior segment of the lingula. No suspicious appearing pulmonary nodules or masses. No acute consolidative airspace disease. No pleural effusions.  Musculoskeletal/Soft Tissues: There are no aggressive appearing lytic or blastic lesions noted in the visualized portions of the skeleton.  CT ABDOMEN AND PELVIS FINDINGS  Hepatobiliary: Mild periportal edema. No suspicious cystic or solid hepatic lesions. No intra or extrahepatic biliary ductal dilatation. Gallbladder is nearly completely decompressed, but otherwise unremarkable in appearance.  Pancreas: No pancreatic mass. No pancreatic ductal dilatation. No pancreatic or peripancreatic fluid or inflammatory changes.  Spleen: Normal in size.  Unremarkable.  Adrenals/Urinary Tract: Bilateral adrenal glands are normal in appearance. Multiple well-circumscribed low-attenuation lesions associated with the kidneys bilaterally, compatible with simple cysts. No suspicious renal lesions. No hydroureteronephrosis. Urinary bladder is normal in appearance.  Stomach/Bowel: Normal appearance of the stomach. No pathologic dilatation of small bowel or colon. Numerous colonic diverticulae are noted, without surrounding inflammatory changes to suggest an acute diverticulitis at this time. Normal appendix.  Vascular/Lymphatic: Mild atherosclerosis throughout the abdominal and pelvic vasculature, without evidence of aneurysm or dissection. Extensive lymphadenopathy noted throughout the abdomen and pelvis. Specific examples include a 1.5 cm short axis portacaval lymph node, mildly  enlarged left common iliac lymph node measuring 11 mm, bilateral obturator lymph nodes (right greater than left), measuring up to 1.9 cm on the right side, and bilateral inguinal lymph nodes (right greater than left), measuring up to 2.1 cm on the right side.  Reproductive: Prostate gland and seminal vesicles are unremarkable in appearance.  Other: Mild edema throughout the retroperitoneum. No significant volume of ascites. No pneumoperitoneum.  Musculoskeletal: There are no aggressive appearing lytic or blastic lesions noted in the visualized portions of the skeleton.  IMPRESSION: 1. Extensive lymphadenopathy throughout the chest, abdomen and pelvis, as above, compatible with recurrent lymphoma. 2. Asymmetric somewhat mass-like enlargement in the region of the left pharyngeal tonsil. Correlation with direct inspection is recommended  to exclude the possibility of underlying neoplasm. 3. Atherosclerosis, including left main and 2 vessel coronary artery disease. Assessment for potential risk factor modification, dietary therapy or pharmacologic therapy may be warranted, if clinically indicated. 4. Colonic diverticulosis without evidence of acute diverticulitis at this time. 5. Additional incidental findings, as above.   Electronically Signed   By: Vinnie Langton M.D.   On: 04/10/2015 10:23   Ct Chest W Contrast  04/10/2015   CLINICAL DATA:  Subsequent evaluation of a 74 year old male with history of lymphoma diagnosed 2 years ago status post chemotherapy and radiation therapy, complaining of weakness, nausea, vomiting, shortness of breath and chest pain for 1 week.  EXAM: CT OF THE NECK WITH CONTRAST  CT OF THE CHEST WITH CONTRAST  CT OF THE ABDOMEN AND PELVIS WITH CONTRAST  TECHNIQUE: Multidetector CT imaging of the neck was performed with intravenous contrast.; Multidetector CT imaging of the abdomen and pelvis was performed following the standard protocol during bolus administration of intravenous contrast.;  Multidetector CT imaging of the chest was performed following the standard protocol during bolus administration of intravenous contrast.  CONTRAST:  14m OMNIPAQUE IOHEXOL 350 MG/ML SOLN  COMPARISON:  PET-CT 12/22/2012  FINDINGS: CT NECK FINDINGS  Multiple prominent but nonenlarged lymph nodes are noted in the cervical regions bilaterally, largest of which is a right-sided submandibular lymph node measuring up to 8 mm in short axis. These have a normal appearing fatty hila, and are considered benign. Asymmetric somewhat mass-like appearing area in the region of the left pharyngeal tonsil, difficult to measure separate from the adjacent uvula, and partially obscured by beam hardening artifact from dental hardware.  CT CHEST FINDINGS  Mediastinum/Lymph Nodes: Heart size is normal. There is no significant pericardial fluid, thickening or pericardial calcification. There is atherosclerosis of the thoracic aorta, the great vessels of the mediastinum and the coronary arteries, including calcified atherosclerotic plaque in the left main, left anterior descending and right coronary arteries. Mildly enlarged low right paratracheal lymph node measuring 1 cm in short axis. Borderline enlarged AP window lymph node measuring 9 mm in short axis. Mildly enlarged lymph nodes in the hilar regions bilaterally measuring up to 1 cm. Esophagus is normal in appearance. Extensive bilateral axillary and subpectoral lymphadenopathy (Right greater than left) measuring up to 2.5 cm in the right axilla.  Lungs/Pleura: Mild scarring in the inferior segment of the lingula. No suspicious appearing pulmonary nodules or masses. No acute consolidative airspace disease. No pleural effusions.  Musculoskeletal/Soft Tissues: There are no aggressive appearing lytic or blastic lesions noted in the visualized portions of the skeleton.  CT ABDOMEN AND PELVIS FINDINGS  Hepatobiliary: Mild periportal edema. No suspicious cystic or solid hepatic lesions. No  intra or extrahepatic biliary ductal dilatation. Gallbladder is nearly completely decompressed, but otherwise unremarkable in appearance.  Pancreas: No pancreatic mass. No pancreatic ductal dilatation. No pancreatic or peripancreatic fluid or inflammatory changes.  Spleen: Normal in size.  Unremarkable.  Adrenals/Urinary Tract: Bilateral adrenal glands are normal in appearance. Multiple well-circumscribed low-attenuation lesions associated with the kidneys bilaterally, compatible with simple cysts. No suspicious renal lesions. No hydroureteronephrosis. Urinary bladder is normal in appearance.  Stomach/Bowel: Normal appearance of the stomach. No pathologic dilatation of small bowel or colon. Numerous colonic diverticulae are noted, without surrounding inflammatory changes to suggest an acute diverticulitis at this time. Normal appendix.  Vascular/Lymphatic: Mild atherosclerosis throughout the abdominal and pelvic vasculature, without evidence of aneurysm or dissection. Extensive lymphadenopathy noted throughout the abdomen and pelvis. Specific examples  include a 1.5 cm short axis portacaval lymph node, mildly enlarged left common iliac lymph node measuring 11 mm, bilateral obturator lymph nodes (right greater than left), measuring up to 1.9 cm on the right side, and bilateral inguinal lymph nodes (right greater than left), measuring up to 2.1 cm on the right side.  Reproductive: Prostate gland and seminal vesicles are unremarkable in appearance.  Other: Mild edema throughout the retroperitoneum. No significant volume of ascites. No pneumoperitoneum.  Musculoskeletal: There are no aggressive appearing lytic or blastic lesions noted in the visualized portions of the skeleton.  IMPRESSION: 1. Extensive lymphadenopathy throughout the chest, abdomen and pelvis, as above, compatible with recurrent lymphoma. 2. Asymmetric somewhat mass-like enlargement in the region of the left pharyngeal tonsil. Correlation with direct  inspection is recommended to exclude the possibility of underlying neoplasm. 3. Atherosclerosis, including left main and 2 vessel coronary artery disease. Assessment for potential risk factor modification, dietary therapy or pharmacologic therapy may be warranted, if clinically indicated. 4. Colonic diverticulosis without evidence of acute diverticulitis at this time. 5. Additional incidental findings, as above.   Electronically Signed   By: Vinnie Langton M.D.   On: 04/10/2015 10:23   Ct Abdomen Pelvis W Contrast  04/10/2015   CLINICAL DATA:  Subsequent evaluation of a 74 year old male with history of lymphoma diagnosed 2 years ago status post chemotherapy and radiation therapy, complaining of weakness, nausea, vomiting, shortness of breath and chest pain for 1 week.  EXAM: CT OF THE NECK WITH CONTRAST  CT OF THE CHEST WITH CONTRAST  CT OF THE ABDOMEN AND PELVIS WITH CONTRAST  TECHNIQUE: Multidetector CT imaging of the neck was performed with intravenous contrast.; Multidetector CT imaging of the abdomen and pelvis was performed following the standard protocol during bolus administration of intravenous contrast.; Multidetector CT imaging of the chest was performed following the standard protocol during bolus administration of intravenous contrast.  CONTRAST:  134m OMNIPAQUE IOHEXOL 350 MG/ML SOLN  COMPARISON:  PET-CT 12/22/2012  FINDINGS: CT NECK FINDINGS  Multiple prominent but nonenlarged lymph nodes are noted in the cervical regions bilaterally, largest of which is a right-sided submandibular lymph node measuring up to 8 mm in short axis. These have a normal appearing fatty hila, and are considered benign. Asymmetric somewhat mass-like appearing area in the region of the left pharyngeal tonsil, difficult to measure separate from the adjacent uvula, and partially obscured by beam hardening artifact from dental hardware.  CT CHEST FINDINGS  Mediastinum/Lymph Nodes: Heart size is normal. There is no  significant pericardial fluid, thickening or pericardial calcification. There is atherosclerosis of the thoracic aorta, the great vessels of the mediastinum and the coronary arteries, including calcified atherosclerotic plaque in the left main, left anterior descending and right coronary arteries. Mildly enlarged low right paratracheal lymph node measuring 1 cm in short axis. Borderline enlarged AP window lymph node measuring 9 mm in short axis. Mildly enlarged lymph nodes in the hilar regions bilaterally measuring up to 1 cm. Esophagus is normal in appearance. Extensive bilateral axillary and subpectoral lymphadenopathy (Right greater than left) measuring up to 2.5 cm in the right axilla.  Lungs/Pleura: Mild scarring in the inferior segment of the lingula. No suspicious appearing pulmonary nodules or masses. No acute consolidative airspace disease. No pleural effusions.  Musculoskeletal/Soft Tissues: There are no aggressive appearing lytic or blastic lesions noted in the visualized portions of the skeleton.  CT ABDOMEN AND PELVIS FINDINGS  Hepatobiliary: Mild periportal edema. No suspicious cystic or solid hepatic lesions. No  intra or extrahepatic biliary ductal dilatation. Gallbladder is nearly completely decompressed, but otherwise unremarkable in appearance.  Pancreas: No pancreatic mass. No pancreatic ductal dilatation. No pancreatic or peripancreatic fluid or inflammatory changes.  Spleen: Normal in size.  Unremarkable.  Adrenals/Urinary Tract: Bilateral adrenal glands are normal in appearance. Multiple well-circumscribed low-attenuation lesions associated with the kidneys bilaterally, compatible with simple cysts. No suspicious renal lesions. No hydroureteronephrosis. Urinary bladder is normal in appearance.  Stomach/Bowel: Normal appearance of the stomach. No pathologic dilatation of small bowel or colon. Numerous colonic diverticulae are noted, without surrounding inflammatory changes to suggest an acute  diverticulitis at this time. Normal appendix.  Vascular/Lymphatic: Mild atherosclerosis throughout the abdominal and pelvic vasculature, without evidence of aneurysm or dissection. Extensive lymphadenopathy noted throughout the abdomen and pelvis. Specific examples include a 1.5 cm short axis portacaval lymph node, mildly enlarged left common iliac lymph node measuring 11 mm, bilateral obturator lymph nodes (right greater than left), measuring up to 1.9 cm on the right side, and bilateral inguinal lymph nodes (right greater than left), measuring up to 2.1 cm on the right side.  Reproductive: Prostate gland and seminal vesicles are unremarkable in appearance.  Other: Mild edema throughout the retroperitoneum. No significant volume of ascites. No pneumoperitoneum.  Musculoskeletal: There are no aggressive appearing lytic or blastic lesions noted in the visualized portions of the skeleton.  IMPRESSION: 1. Extensive lymphadenopathy throughout the chest, abdomen and pelvis, as above, compatible with recurrent lymphoma. 2. Asymmetric somewhat mass-like enlargement in the region of the left pharyngeal tonsil. Correlation with direct inspection is recommended to exclude the possibility of underlying neoplasm. 3. Atherosclerosis, including left main and 2 vessel coronary artery disease. Assessment for potential risk factor modification, dietary therapy or pharmacologic therapy may be warranted, if clinically indicated. 4. Colonic diverticulosis without evidence of acute diverticulitis at this time. 5. Additional incidental findings, as above.   Electronically Signed   By: Vinnie Langton M.D.   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  PLAN:    1. Lymphoma: CT scan results reported as above and reviewed independently likely indicating recurrence of his lymphoma. We will get biopsy of right axillary lymph node which is palpable as well as a bone marrow biopsy  to complete staging workup in anticipation of reinitiating chemotherapy in the next 1-2 weeks. Will also consider a PET scan in the near future for staging purposes. 2. Pain: Continue Percocet as needed, patient will also receive IV morphine as needed. 3. Fevers:  Tylenol as needed, will initiate IV fluids as well. 4. Disposition: Patient will likely remain in the hospital for 3-4 days until his pain is under control and staging has been completed.   Lloyd Huger, MD   04/13/2015 4:50 PM

## 2015-04-14 ENCOUNTER — Encounter: Payer: Self-pay | Admitting: Oncology

## 2015-04-14 ENCOUNTER — Inpatient Hospital Stay
Admission: AD | Admit: 2015-04-14 | Discharge: 2015-04-14 | Disposition: A | Discharge status: Home / Self Care | Payer: Medicare Other | Source: Ambulatory Visit | Attending: Oncology | Admitting: Oncology

## 2015-04-14 DIAGNOSIS — C8330 Diffuse large B-cell lymphoma, unspecified site: Secondary | ICD-10-CM | POA: Diagnosis not present

## 2015-04-14 DIAGNOSIS — R52 Pain, unspecified: Secondary | ICD-10-CM | POA: Diagnosis present

## 2015-04-14 LAB — CBC
HCT: 41.4 % (ref 40.0–52.0)
Hemoglobin: 13.5 g/dL (ref 13.0–18.0)
MCH: 29.5 pg (ref 26.0–34.0)
MCHC: 32.6 g/dL (ref 32.0–36.0)
MCV: 90.6 fL (ref 80.0–100.0)
Platelets: 315 10*3/uL (ref 150–440)
RBC: 4.58 MIL/uL (ref 4.40–5.90)
RDW: 13.8 % (ref 11.5–14.5)
WBC: 7.9 10*3/uL (ref 3.8–10.6)

## 2015-04-14 LAB — BASIC METABOLIC PANEL
Anion gap: 7 (ref 5–15)
BUN: 8 mg/dL (ref 6–20)
CHLORIDE: 101 mmol/L (ref 101–111)
CO2: 30 mmol/L (ref 22–32)
CREATININE: 0.83 mg/dL (ref 0.61–1.24)
Calcium: 8.8 mg/dL — ABNORMAL LOW (ref 8.9–10.3)
GFR calc Af Amer: >60 mL/min (ref >60)
Glucose, Bld: 113 mg/dL — ABNORMAL HIGH (ref 65–99)
Potassium: 4.8 mmol/L (ref 3.5–5.1)
Sodium: 138 mmol/L (ref 135–145)

## 2015-04-14 LAB — PROTIME-INR
INR: 1.06
Prothrombin Time: 14 seconds (ref 11.4–15.0)

## 2015-04-14 LAB — DIFFERENTIAL
Basophils Relative: 0 %
Eosinophils Relative: 22 %
LYMPHS PCT: 11 %
Monocytes Relative: 12 %
NEUTROS PCT: 55 %

## 2015-04-14 MED ORDER — FENTANYL CITRATE (PF) 100 MCG/2ML IJ SOLN
INTRAMUSCULAR | Status: AC | PRN
  Administered 2015-04-14: 25 ug via INTRAVENOUS
  Administered 2015-04-14: 50 ug via INTRAVENOUS
  Administered 2015-04-14: 25 ug via INTRAVENOUS

## 2015-04-14 MED ORDER — FENTANYL CITRATE (PF) 100 MCG/2ML IJ SOLN
INTRAMUSCULAR | Status: AC | PRN
  Administered 2015-04-14 (×3): 25 ug via INTRAVENOUS

## 2015-04-14 MED ORDER — MIDAZOLAM HCL 5 MG/5ML IJ SOLN
INTRAMUSCULAR | Status: AC | PRN
  Administered 2015-04-14: 1 mg via INTRAVENOUS
  Administered 2015-04-14: 0.5 mg via INTRAVENOUS
  Administered 2015-04-14: 1 mg via INTRAVENOUS

## 2015-04-14 MED ORDER — MIDAZOLAM HCL 5 MG/5ML IJ SOLN
INTRAMUSCULAR | Status: AC
  Filled 2015-04-14: qty 5

## 2015-04-14 MED ORDER — MIDAZOLAM HCL 5 MG/5ML IJ SOLN
INTRAMUSCULAR | Status: AC | PRN
  Administered 2015-04-14 (×2): 0.5 mg via INTRAVENOUS

## 2015-04-14 MED ORDER — HEPARIN SOD (PORK) LOCK FLUSH 100 UNIT/ML IV SOLN
INTRAVENOUS | Status: AC
  Filled 2015-04-14: qty 5

## 2015-04-14 MED ORDER — FENTANYL CITRATE (PF) 100 MCG/2ML IJ SOLN
INTRAMUSCULAR | Status: AC
  Filled 2015-04-14: qty 4

## 2015-04-14 NOTE — Procedures (Signed)
R iliac BM Bx - 11 g core R ac LN Bx - 18 g cores times 6 No comp/EBL

## 2015-04-14 NOTE — Progress Notes (Signed)
Took over care of patient at 1520 from [place], [place]. No complaints of pain at this time. Madlyn Frankel, RN

## 2015-04-14 NOTE — Progress Notes (Signed)
Initial Nutrition Assessment  INTERVENTION:   Meals and Snacks: Cater to patient preferences as pt on regular diet Medical Food Supplement Therapy: will recommend Might[employer] Shakes on meal trays TID for added nutrition (each shake provides 300kcals and 9g protein)   NUTRITION DIAGNOSIS:   Inadequate oral intake related to poor appetite as evidenced by per patient/family report.  GOAL:   Patient will meet greater than or equal to 90% of their needs  MONITOR:    (Energy Intake, anthropometrics, Digestive system)  REASON FOR ASSESSMENT:   Malnutrition Screening Tool    ASSESSMENT:   Pt admitted with pain secondary to likely recurrence of lymphoma . Per MD note pt with lymphoma s/p chemotherapy March 2014.  Past Medical History  Diagnosis Date  . Stroke   . Seizures   . Hypercholesteremia   . HTN (hypertension)   . Cancer     T-Cell Lymphoma    Diet Order:  Diet regular Room service appropriate?: Yes; Fluid consistency:: Thin    Current Nutrition: Pt NPO today for procedure.  Food/Nutrition-Related History: Pt out of room on visit. Wife reports pt has been eating less at meal times for the past week. Pt wife reports pt thinking early satiety from bloated feeling after eating bread, however wife doubts this to be true. Pt was drinking smoothies made with beets PTA, no supplement drinks.   Medications: NS at 133mL/hr  Electrolyte/Renal Profile and Glucose Profile:   Recent Labs Lab 04/10/15 0958 04/13/15 1545 04/14/15 0514  NA 133* 132* 138  K 4.4 3.8 4.8  CL 99* 97* 101  CO2 27 26 30   BUN 12 11 8   CREATININE 1.00 1.01 0.83  CALCIUM 8.7* 8.3* 8.8*  GLUCOSE 116* 105* 113*   Protein Profile:  Recent Labs Lab 04/10/15 0958 04/13/15 1545  ALBUMIN 3.5 3.7    Gastrointestinal Profile: distended abdomen per Nsg Last BM: 04/14/2015   Skin:  Reviewed, no issues  Nutrition-Focused Physical Exam Findings:  Unable to complete Nutrition-Focused physical exam at  this time.    Weight Change: Pt wife reports a few pounds weight loss likely (2% in 5  Months per [facility])  Height:   Ht Readings from Last 1 Encounters:  04/13/15 5\' 11"  (1.803 m)    Weight:   Wt Readings from Last 1 Encounters:  04/13/15 203 lb (92.08 kg)    Wt Readings from Last 10 Encounters:  04/13/15  203 lb (92.08 kg)  04/13/15  202 lb (91.627 kg)  04/10/15  205 lb (92.987 kg)  01/20/15  207 lb 3.7 oz (94 kg)  12/14/14  207 lb 10.8 oz (94.2 kg)   BMI:  Body mass index is 28.33 kg/(m^2).  Estimated Nutritional Needs:   Kcal:  2213-2616kcals, BEE: 1677kcals, TEE: (IF 1.1-1.3)(AF 1.2)   Protein:  74-101g protein  (0.8-1.0g/kg)   Fluid:  2300-2740mL of fludi (25-52mL/kg)  EDUCATION NEEDS:   No education needs identified at this time  Winslow Shedrick Sarli, RD, LDN Pager [phone]

## 2015-04-14 NOTE — Consult Note (Signed)
Chief Complaint: Patient was seen in consultation today for No chief complaint on file.  at the request of ErikTimothy Shelton  Referring Physician(s): ErikTimothy Shelton  History of Present Illness: Erik Shelton is a 74 y.o. male with recurrent adenopathy and probable lymphoma. LN and BM biopsy are requested.  Past Medical History  Diagnosis Date  . Stroke   . Seizures   . Hypercholesteremia   . HTN (hypertension)   . Cancer     T-Cell Lymphoma    No past surgical history on file.  Allergies: No known allergies  Medications: Prior to Admission medications   Medication Sig Start Date End Date Taking? Authorizing Provider  aspirin 81 MG tablet Take 81 mg by mouth daily.    Historical Provider, MD  losartan (COZAAR) 50 MG tablet Take 50 mg by mouth daily.    Historical Provider, MD  oxyCODONE-acetaminophen (ROXICET) 5-325 MG per tablet Take 1 tablet by mouth every 4 (four) hours as needed for severe pain. 04/10/15   Erik Pear, MD  pravastatin (PRAVACHOL) 80 MG tablet Take 80 mg by mouth daily.    Historical Provider, MD     No family history on file.  History   Social History  . Marital Status: Married    Spouse Name: N/A  . Number of Children: N/A  . Years of Education: N/A   Social History Main Topics  . Smoking status: Former Research scientist (life sciences)  . Smokeless tobacco: Not on file     Comment: Quit  in 1990  . Alcohol Use: Yes     Comment: Occassional glass of wine  . Drug Use: No  . Sexual Activity: Not on file   Other Topics Concern  . Not on file   Social History Narrative      Review of Systems: A 12 point ROS discussed and pertinent positives are indicated in the HPI above.  All other systems are negative.  Review of Systems  Constitutional: Negative.   Respiratory: Negative.   Cardiovascular: Negative.     Vital Signs: There were no vitals taken for this visit.  Physical Exam  Constitutional: He is oriented to person, place, and time. He  appears well-developed and well-nourished.  Neck: Normal range of motion. Neck supple.  Cardiovascular: Normal rate.   Pulmonary/Chest: Effort normal and breath sounds normal.  Neurological: He is alert and oriented to person, place, and time.    Mallampati Score:     Imaging: Ct Soft Tissue Neck W Contrast  04/10/2015   CLINICAL DATA:  Subsequent evaluation of a 74 year old male with history of lymphoma diagnosed 2 years ago status post chemotherapy and radiation therapy, complaining of weakness, nausea, vomiting, shortness of breath and chest pain for 1 week.  EXAM: CT OF THE NECK WITH CONTRAST  CT OF THE CHEST WITH CONTRAST  CT OF THE ABDOMEN AND PELVIS WITH CONTRAST  TECHNIQUE: Multidetector CT imaging of the neck was performed with intravenous contrast.; Multidetector CT imaging of the abdomen and pelvis was performed following the standard protocol during bolus administration of intravenous contrast.; Multidetector CT imaging of the chest was performed following the standard protocol during bolus administration of intravenous contrast.  CONTRAST:  184m OMNIPAQUE IOHEXOL 350 MG/ML SOLN  COMPARISON:  PET-CT 12/22/2012  FINDINGS: CT NECK FINDINGS  Multiple prominent but nonenlarged lymph nodes are noted in the cervical regions bilaterally, largest of which is a right-sided submandibular lymph node measuring up to 8 mm in short axis. These have a normal appearing  fatty hila, and are considered benign. Asymmetric somewhat mass-like appearing area in the region of the left pharyngeal tonsil, difficult to measure separate from the adjacent uvula, and partially obscured by beam hardening artifact from dental hardware.  CT CHEST FINDINGS  Mediastinum/Lymph Nodes: Heart size is normal. There is no significant pericardial fluid, thickening or pericardial calcification. There is atherosclerosis of the thoracic aorta, the great vessels of the mediastinum and the coronary arteries, including calcified  atherosclerotic plaque in the left main, left anterior descending and right coronary arteries. Mildly enlarged low right paratracheal lymph node measuring 1 cm in short axis. Borderline enlarged AP window lymph node measuring 9 mm in short axis. Mildly enlarged lymph nodes in the hilar regions bilaterally measuring up to 1 cm. Esophagus is normal in appearance. Extensive bilateral axillary and subpectoral lymphadenopathy (Right greater than left) measuring up to 2.5 cm in the right axilla.  Lungs/Pleura: Mild scarring in the inferior segment of the lingula. No suspicious appearing pulmonary nodules or masses. No acute consolidative airspace disease. No pleural effusions.  Musculoskeletal/Soft Tissues: There are no aggressive appearing lytic or blastic lesions noted in the visualized portions of the skeleton.  CT ABDOMEN AND PELVIS FINDINGS  Hepatobiliary: Mild periportal edema. No suspicious cystic or solid hepatic lesions. No intra or extrahepatic biliary ductal dilatation. Gallbladder is nearly completely decompressed, but otherwise unremarkable in appearance.  Pancreas: No pancreatic mass. No pancreatic ductal dilatation. No pancreatic or peripancreatic fluid or inflammatory changes.  Spleen: Normal in size.  Unremarkable.  Adrenals/Urinary Tract: Bilateral adrenal glands are normal in appearance. Multiple well-circumscribed low-attenuation lesions associated with the kidneys bilaterally, compatible with simple cysts. No suspicious renal lesions. No hydroureteronephrosis. Urinary bladder is normal in appearance.  Stomach/Bowel: Normal appearance of the stomach. No pathologic dilatation of small bowel or colon. Numerous colonic diverticulae are noted, without surrounding inflammatory changes to suggest an acute diverticulitis at this time. Normal appendix.  Vascular/Lymphatic: Mild atherosclerosis throughout the abdominal and pelvic vasculature, without evidence of aneurysm or dissection. Extensive lymphadenopathy  noted throughout the abdomen and pelvis. Specific examples include a 1.5 cm short axis portacaval lymph node, mildly enlarged left common iliac lymph node measuring 11 mm, bilateral obturator lymph nodes (right greater than left), measuring up to 1.9 cm on the right side, and bilateral inguinal lymph nodes (right greater than left), measuring up to 2.1 cm on the right side.  Reproductive: Prostate gland and seminal vesicles are unremarkable in appearance.  Other: Mild edema throughout the retroperitoneum. No significant volume of ascites. No pneumoperitoneum.  Musculoskeletal: There are no aggressive appearing lytic or blastic lesions noted in the visualized portions of the skeleton.  IMPRESSION: 1. Extensive lymphadenopathy throughout the chest, abdomen and pelvis, as above, compatible with recurrent lymphoma. 2. Asymmetric somewhat mass-like enlargement in the region of the left pharyngeal tonsil. Correlation with direct inspection is recommended to exclude the possibility of underlying neoplasm. 3. Atherosclerosis, including left main and 2 vessel coronary artery disease. Assessment for potential risk factor modification, dietary therapy or pharmacologic therapy may be warranted, if clinically indicated. 4. Colonic diverticulosis without evidence of acute diverticulitis at this time. 5. Additional incidental findings, as above.   Electronically Signed   By: Vinnie Langton M.D.   On: 04/10/2015 10:23   Ct Chest W Contrast  04/10/2015   CLINICAL DATA:  Subsequent evaluation of a 74 year old male with history of lymphoma diagnosed 2 years ago status post chemotherapy and radiation therapy, complaining of weakness, nausea, vomiting, shortness of breath and chest  pain for 1 week.  EXAM: CT OF THE NECK WITH CONTRAST  CT OF THE CHEST WITH CONTRAST  CT OF THE ABDOMEN AND PELVIS WITH CONTRAST  TECHNIQUE: Multidetector CT imaging of the neck was performed with intravenous contrast.; Multidetector CT imaging of the  abdomen and pelvis was performed following the standard protocol during bolus administration of intravenous contrast.; Multidetector CT imaging of the chest was performed following the standard protocol during bolus administration of intravenous contrast.  CONTRAST:  138m OMNIPAQUE IOHEXOL 350 MG/ML SOLN  COMPARISON:  PET-CT 12/22/2012  FINDINGS: CT NECK FINDINGS  Multiple prominent but nonenlarged lymph nodes are noted in the cervical regions bilaterally, largest of which is a right-sided submandibular lymph node measuring up to 8 mm in short axis. These have a normal appearing fatty hila, and are considered benign. Asymmetric somewhat mass-like appearing area in the region of the left pharyngeal tonsil, difficult to measure separate from the adjacent uvula, and partially obscured by beam hardening artifact from dental hardware.  CT CHEST FINDINGS  Mediastinum/Lymph Nodes: Heart size is normal. There is no significant pericardial fluid, thickening or pericardial calcification. There is atherosclerosis of the thoracic aorta, the great vessels of the mediastinum and the coronary arteries, including calcified atherosclerotic plaque in the left main, left anterior descending and right coronary arteries. Mildly enlarged low right paratracheal lymph node measuring 1 cm in short axis. Borderline enlarged AP window lymph node measuring 9 mm in short axis. Mildly enlarged lymph nodes in the hilar regions bilaterally measuring up to 1 cm. Esophagus is normal in appearance. Extensive bilateral axillary and subpectoral lymphadenopathy (Right greater than left) measuring up to 2.5 cm in the right axilla.  Lungs/Pleura: Mild scarring in the inferior segment of the lingula. No suspicious appearing pulmonary nodules or masses. No acute consolidative airspace disease. No pleural effusions.  Musculoskeletal/Soft Tissues: There are no aggressive appearing lytic or blastic lesions noted in the visualized portions of the skeleton.  CT  ABDOMEN AND PELVIS FINDINGS  Hepatobiliary: Mild periportal edema. No suspicious cystic or solid hepatic lesions. No intra or extrahepatic biliary ductal dilatation. Gallbladder is nearly completely decompressed, but otherwise unremarkable in appearance.  Pancreas: No pancreatic mass. No pancreatic ductal dilatation. No pancreatic or peripancreatic fluid or inflammatory changes.  Spleen: Normal in size.  Unremarkable.  Adrenals/Urinary Tract: Bilateral adrenal glands are normal in appearance. Multiple well-circumscribed low-attenuation lesions associated with the kidneys bilaterally, compatible with simple cysts. No suspicious renal lesions. No hydroureteronephrosis. Urinary bladder is normal in appearance.  Stomach/Bowel: Normal appearance of the stomach. No pathologic dilatation of small bowel or colon. Numerous colonic diverticulae are noted, without surrounding inflammatory changes to suggest an acute diverticulitis at this time. Normal appendix.  Vascular/Lymphatic: Mild atherosclerosis throughout the abdominal and pelvic vasculature, without evidence of aneurysm or dissection. Extensive lymphadenopathy noted throughout the abdomen and pelvis. Specific examples include a 1.5 cm short axis portacaval lymph node, mildly enlarged left common iliac lymph node measuring 11 mm, bilateral obturator lymph nodes (right greater than left), measuring up to 1.9 cm on the right side, and bilateral inguinal lymph nodes (right greater than left), measuring up to 2.1 cm on the right side.  Reproductive: Prostate gland and seminal vesicles are unremarkable in appearance.  Other: Mild edema throughout the retroperitoneum. No significant volume of ascites. No pneumoperitoneum.  Musculoskeletal: There are no aggressive appearing lytic or blastic lesions noted in the visualized portions of the skeleton.  IMPRESSION: 1. Extensive lymphadenopathy throughout the chest, abdomen and pelvis, as above,  compatible with recurrent lymphoma.  2. Asymmetric somewhat mass-like enlargement in the region of the left pharyngeal tonsil. Correlation with direct inspection is recommended to exclude the possibility of underlying neoplasm. 3. Atherosclerosis, including left main and 2 vessel coronary artery disease. Assessment for potential risk factor modification, dietary therapy or pharmacologic therapy may be warranted, if clinically indicated. 4. Colonic diverticulosis without evidence of acute diverticulitis at this time. 5. Additional incidental findings, as above.   Electronically Signed   By: Vinnie Langton M.D.   On: 04/10/2015 10:23   Ct Abdomen Pelvis W Contrast  04/10/2015   CLINICAL DATA:  Subsequent evaluation of a 74 year old male with history of lymphoma diagnosed 2 years ago status post chemotherapy and radiation therapy, complaining of weakness, nausea, vomiting, shortness of breath and chest pain for 1 week.  EXAM: CT OF THE NECK WITH CONTRAST  CT OF THE CHEST WITH CONTRAST  CT OF THE ABDOMEN AND PELVIS WITH CONTRAST  TECHNIQUE: Multidetector CT imaging of the neck was performed with intravenous contrast.; Multidetector CT imaging of the abdomen and pelvis was performed following the standard protocol during bolus administration of intravenous contrast.; Multidetector CT imaging of the chest was performed following the standard protocol during bolus administration of intravenous contrast.  CONTRAST:  118m OMNIPAQUE IOHEXOL 350 MG/ML SOLN  COMPARISON:  PET-CT 12/22/2012  FINDINGS: CT NECK FINDINGS  Multiple prominent but nonenlarged lymph nodes are noted in the cervical regions bilaterally, largest of which is a right-sided submandibular lymph node measuring up to 8 mm in short axis. These have a normal appearing fatty hila, and are considered benign. Asymmetric somewhat mass-like appearing area in the region of the left pharyngeal tonsil, difficult to measure separate from the adjacent uvula, and partially obscured by beam hardening  artifact from dental hardware.  CT CHEST FINDINGS  Mediastinum/Lymph Nodes: Heart size is normal. There is no significant pericardial fluid, thickening or pericardial calcification. There is atherosclerosis of the thoracic aorta, the great vessels of the mediastinum and the coronary arteries, including calcified atherosclerotic plaque in the left main, left anterior descending and right coronary arteries. Mildly enlarged low right paratracheal lymph node measuring 1 cm in short axis. Borderline enlarged AP window lymph node measuring 9 mm in short axis. Mildly enlarged lymph nodes in the hilar regions bilaterally measuring up to 1 cm. Esophagus is normal in appearance. Extensive bilateral axillary and subpectoral lymphadenopathy (Right greater than left) measuring up to 2.5 cm in the right axilla.  Lungs/Pleura: Mild scarring in the inferior segment of the lingula. No suspicious appearing pulmonary nodules or masses. No acute consolidative airspace disease. No pleural effusions.  Musculoskeletal/Soft Tissues: There are no aggressive appearing lytic or blastic lesions noted in the visualized portions of the skeleton.  CT ABDOMEN AND PELVIS FINDINGS  Hepatobiliary: Mild periportal edema. No suspicious cystic or solid hepatic lesions. No intra or extrahepatic biliary ductal dilatation. Gallbladder is nearly completely decompressed, but otherwise unremarkable in appearance.  Pancreas: No pancreatic mass. No pancreatic ductal dilatation. No pancreatic or peripancreatic fluid or inflammatory changes.  Spleen: Normal in size.  Unremarkable.  Adrenals/Urinary Tract: Bilateral adrenal glands are normal in appearance. Multiple well-circumscribed low-attenuation lesions associated with the kidneys bilaterally, compatible with simple cysts. No suspicious renal lesions. No hydroureteronephrosis. Urinary bladder is normal in appearance.  Stomach/Bowel: Normal appearance of the stomach. No pathologic dilatation of small bowel or  colon. Numerous colonic diverticulae are noted, without surrounding inflammatory changes to suggest an acute diverticulitis at this time. Normal appendix.  Vascular/Lymphatic: Mild atherosclerosis throughout the abdominal and pelvic vasculature, without evidence of aneurysm or dissection. Extensive lymphadenopathy noted throughout the abdomen and pelvis. Specific examples include a 1.5 cm short axis portacaval lymph node, mildly enlarged left common iliac lymph node measuring 11 mm, bilateral obturator lymph nodes (right greater than left), measuring up to 1.9 cm on the right side, and bilateral inguinal lymph nodes (right greater than left), measuring up to 2.1 cm on the right side.  Reproductive: Prostate gland and seminal vesicles are unremarkable in appearance.  Other: Mild edema throughout the retroperitoneum. No significant volume of ascites. No pneumoperitoneum.  Musculoskeletal: There are no aggressive appearing lytic or blastic lesions noted in the visualized portions of the skeleton.  IMPRESSION: 1. Extensive lymphadenopathy throughout the chest, abdomen and pelvis, as above, compatible with recurrent lymphoma. 2. Asymmetric somewhat mass-like enlargement in the region of the left pharyngeal tonsil. Correlation with direct inspection is recommended to exclude the possibility of underlying neoplasm. 3. Atherosclerosis, including left main and 2 vessel coronary artery disease. Assessment for potential risk factor modification, dietary therapy or pharmacologic therapy may be warranted, if clinically indicated. 4. Colonic diverticulosis without evidence of acute diverticulitis at this time. 5. Additional incidental findings, as above.   Electronically Signed   By: Vinnie Langton M.D.   On: 04/10/2015 10:23    Labs:  CBC:  Recent Labs  12/09/14 0940 04/10/15 0958 04/13/15 1545 04/14/15 0514  WBC 6.9 9.5 9.2 7.9  HGB 14.4 13.7 14.0 13.5  HCT 44.2 41.7 43.3 41.4  PLT 257 333 362 315     COAGS:  Recent Labs  04/14/15 0514  INR 1.06    BMP:  Recent Labs  12/09/14 0940 04/10/15 0958 04/13/15 1545 04/14/15 0514  NA 139 133* 132* 138  K 4.4 4.4 3.8 4.8  CL 105 99* 97* 101  CO2 29 27 26 30   GLUCOSE 121* 116* 105* 113*  BUN 23* 12 11 8   CALCIUM 9.3 8.7* 8.3* 8.8*  CREATININE 1.08 1.00 1.01 0.83  GFRNONAA >60 >60 >60 >60  GFRAA >60 >60 >60 >60    LIVER FUNCTION TESTS:  Recent Labs  04/10/15 0958 04/13/15 1545  BILITOT 0.7 1.4*  AST 56* 53*  ALT 78* 95*  ALKPHOS 294* 352*  PROT 6.8 7.1  ALBUMIN 3.5 3.7    TUMOR MARKERS: No results for input(s): AFPTM, CEA, CA199, CHROMGRNA in the last 8760 hours.  Assessment and Plan:  Recurrent adenopathy, referred for LN and BM biopsy as a lymphoma workup.  Thank you for this interesting consult.  I greatly enjoyed meeting Laakea Pereira and look forward to participating in their care.  A copy of this report was sent to the requesting provider on this date.  Signed: Jestin Burbach, ART A 04/14/2015, 11:54 AM   I spent a total of 20 Minutes  in face to face in clinical consultation, greater than 50% of which was counseling/coordinating care for lymph node and bone marrow biopsy.

## 2015-04-14 NOTE — Progress Notes (Signed)
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  INTERVAL HISTORY: Patient's abdominal pain improved. He also has remained afebrile throughout his admission. He continues to have tenderness at the site of his bone marrow biopsy and lymph node biopsy. He is mildly lethargic, but otherwise feels well. He offers no further specific complaints.  REVIEW OF SYSTEMS:   Review of Systems  Constitutional: Positive for malaise/fatigue. Negative for fever.  Respiratory: Negative.   Cardiovascular: Negative.   Gastrointestinal: Negative for nausea and abdominal pain.  Neurological: Positive for weakness.    As per HPI. Otherwise, a complete review of systems is negatve.  PAST MEDICAL HISTORY: Past Medical History  Diagnosis Date  . Stroke   . Seizures   . Hypercholesteremia   . HTN (hypertension)   . Cancer     T-Cell Lymphoma    PAST SURGICAL HISTORY: History reviewed. No pertinent past surgical history.  FAMILY HISTORY History reviewed. No pertinent family history.     ADVANCED DIRECTIVES:    HEALTH MAINTENANCE: History  Substance Use Topics  . Smoking status: Former Research scientist (life sciences)  . Smokeless tobacco: Not on file     Comment: Quit  in 1990  . Alcohol Use: Yes     Comment: Occassional glass of wine     Colonoscopy:  PAP:  Bone density:  Lipid panel:  Allergies  Allergen Reactions  . No Known Allergies     Current Facility-Administered Medications  Medication Dose Route Frequency Provider Last Rate Last Dose  . 0.9 %  sodium chloride infusion  250 mL Intravenous PRN Lloyd Huger, MD      . 0.9 %  sodium chloride infusion   Intravenous Continuous Lloyd Huger, MD 125 mL/hr at 04/14/15 1520    .  acetaminophen (TYLENOL) tablet 650 mg  650 mg Oral Q4H PRN Lloyd Huger, MD   650 mg at 04/13/15 2144  . alum & mag hydroxide-simeth (MAALOX/MYLANTA) 200-200-20 MG/5ML suspension 60 mL  60 mL Oral Q4H PRN Lloyd Huger, MD      . enoxaparin (LOVENOX) injection 40 mg  40 mg Subcutaneous Q24H Lloyd Huger, MD   Stopped at 04/13/15 1730  . fentaNYL (SUBLIMAZE) 100 MCG/2ML injection           . guaiFENesin-dextromethorphan (ROBITUSSIN DM) 100-10 MG/5ML syrup 10 mL  10 mL Oral Q4H PRN Lloyd Huger, MD      . heparin lock flush 100 UNIT/ML injection           . hydrocortisone (ANUSOL-HC) 2.5 % rectal cream 1 application  1 application Rectal BID PRN Lloyd Huger, MD      . losartan (COZAAR) tablet 50 mg  50 mg Oral Daily Lloyd Huger, MD   50 mg at 04/13/15 1730  . midazolam (VERSED) 5 MG/5ML injection           . morphine 2 MG/ML injection 2 mg  2 mg Intravenous Q2H PRN Lloyd Huger, MD      . ondansetron [facility]) tablet 4-8 mg  4-8 mg Oral Q8H PRN Lloyd Huger, MD       Or  . ondansetron (ZOFRAN-ODT) disintegrating tablet 4-8 mg  4-8 mg Oral Q8H PRN Lloyd Huger, MD  Or  . ondansetron (ZOFRAN) injection 4 mg  4 mg Intravenous Q8H PRN Lloyd Huger, MD       Or  . ondansetron (ZOFRAN) 8 mg in sodium chloride 0.9 % 50 mL IVPB  8 mg Intravenous Q8H PRN Lloyd Huger, MD      . oxyCODONE-acetaminophen (PERCOCET/ROXICET) 5-325 MG per tablet 1 tablet  1 tablet Oral Q4H PRN Lloyd Huger, MD      . senna-docusate (Senokot-S) tablet 1 tablet  1 tablet Oral QHS PRN Lloyd Huger, MD      . sodium chloride 0.9 % injection 3 mL  3 mL Intravenous Q12H Lloyd Huger, MD   3 mL at 04/13/15 2145  . sodium chloride 0.9 % injection 3 mL  3 mL Intravenous PRN Lloyd Huger, MD       Facility-Administered Medications Ordered in Other Encounters  Medication Dose Route Frequency Provider Last Rate Last Dose  . acetaminophen  (TYLENOL) tablet 650 mg  650 mg Oral Q4H PRN Lloyd Huger, MD        OBJECTIVE: Filed Vitals:   04/14/15 0500  BP: 139/84  Pulse: 84  Temp: 99.3 F (37.4 C)  Resp: 20     Body mass index is 28.33 kg/(m^2).    ECOG FS:1 - Symptomatic but completely ambulatory  General: Well-developed, well-nourished, no acute distress. Eyes: Pink conjunctiva, anicteric sclera. Lungs: Clear to auscultation bilaterally. Heart: Regular rate and rhythm. No rubs, murmurs, or gallops. Abdomen: Soft, nontender, nondistended. No organomegaly noted, normoactive bowel sounds. Musculoskeletal: No edema, cyanosis, or clubbing. Neuro: Alert, answering all questions appropriately. Cranial nerves grossly intact. Skin: No rashes or petechiae noted. Psych: Normal affect.   LAB RESULTS:  Lab Results  Component Value Date   NA 138 04/14/2015   K 4.8 04/14/2015   CL 101 04/14/2015   CO2 30 04/14/2015   GLUCOSE 113* 04/14/2015   BUN 8 04/14/2015   CREATININE 0.83 04/14/2015   CALCIUM 8.8* 04/14/2015   PROT 7.1 04/13/2015   ALBUMIN 3.7 04/13/2015   AST 53* 04/13/2015   ALT 95* 04/13/2015   ALKPHOS 352* 04/13/2015   BILITOT 1.4* 04/13/2015   GFRNONAA >60 04/14/2015   GFRAA >60 04/14/2015    Lab Results  Component Value Date   WBC 7.9 04/14/2015   NEUTROABS 6.1 04/13/2015   HGB 13.5 04/14/2015   HCT 41.4 04/14/2015   MCV 90.6 04/14/2015   PLT 315 04/14/2015     STUDIES: Ct Soft Tissue Neck W Contrast  04/10/2015   CLINICAL DATA:  Subsequent evaluation of a 74 year old male with history of lymphoma diagnosed 2 years ago status post chemotherapy and radiation therapy, complaining of weakness, nausea, vomiting, shortness of breath and chest pain for 1 week.  EXAM: CT OF THE NECK WITH CONTRAST  CT OF THE CHEST WITH CONTRAST  CT OF THE ABDOMEN AND PELVIS WITH CONTRAST  TECHNIQUE: Multidetector CT imaging of the neck was performed with intravenous contrast.; Multidetector CT imaging of the abdomen  and pelvis was performed following the standard protocol during bolus administration of intravenous contrast.; Multidetector CT imaging of the chest was performed following the standard protocol during bolus administration of intravenous contrast.  CONTRAST:  11m OMNIPAQUE IOHEXOL 350 MG/ML SOLN  COMPARISON:  PET-CT 12/22/2012  FINDINGS: CT NECK FINDINGS  Multiple prominent but nonenlarged lymph nodes are noted in the cervical regions bilaterally, largest of which is a right-sided submandibular lymph node measuring up to 8 mm in short axis.  These have a normal appearing fatty hila, and are considered benign. Asymmetric somewhat mass-like appearing area in the region of the left pharyngeal tonsil, difficult to measure separate from the adjacent uvula, and partially obscured by beam hardening artifact from dental hardware.  CT CHEST FINDINGS  Mediastinum/Lymph Nodes: Heart size is normal. There is no significant pericardial fluid, thickening or pericardial calcification. There is atherosclerosis of the thoracic aorta, the great vessels of the mediastinum and the coronary arteries, including calcified atherosclerotic plaque in the left main, left anterior descending and right coronary arteries. Mildly enlarged low right paratracheal lymph node measuring 1 cm in short axis. Borderline enlarged AP window lymph node measuring 9 mm in short axis. Mildly enlarged lymph nodes in the hilar regions bilaterally measuring up to 1 cm. Esophagus is normal in appearance. Extensive bilateral axillary and subpectoral lymphadenopathy (Right greater than left) measuring up to 2.5 cm in the right axilla.  Lungs/Pleura: Mild scarring in the inferior segment of the lingula. No suspicious appearing pulmonary nodules or masses. No acute consolidative airspace disease. No pleural effusions.  Musculoskeletal/Soft Tissues: There are no aggressive appearing lytic or blastic lesions noted in the visualized portions of the skeleton.  CT ABDOMEN  AND PELVIS FINDINGS  Hepatobiliary: Mild periportal edema. No suspicious cystic or solid hepatic lesions. No intra or extrahepatic biliary ductal dilatation. Gallbladder is nearly completely decompressed, but otherwise unremarkable in appearance.  Pancreas: No pancreatic mass. No pancreatic ductal dilatation. No pancreatic or peripancreatic fluid or inflammatory changes.  Spleen: Normal in size.  Unremarkable.  Adrenals/Urinary Tract: Bilateral adrenal glands are normal in appearance. Multiple well-circumscribed low-attenuation lesions associated with the kidneys bilaterally, compatible with simple cysts. No suspicious renal lesions. No hydroureteronephrosis. Urinary bladder is normal in appearance.  Stomach/Bowel: Normal appearance of the stomach. No pathologic dilatation of small bowel or colon. Numerous colonic diverticulae are noted, without surrounding inflammatory changes to suggest an acute diverticulitis at this time. Normal appendix.  Vascular/Lymphatic: Mild atherosclerosis throughout the abdominal and pelvic vasculature, without evidence of aneurysm or dissection. Extensive lymphadenopathy noted throughout the abdomen and pelvis. Specific examples include a 1.5 cm short axis portacaval lymph node, mildly enlarged left common iliac lymph node measuring 11 mm, bilateral obturator lymph nodes (right greater than left), measuring up to 1.9 cm on the right side, and bilateral inguinal lymph nodes (right greater than left), measuring up to 2.1 cm on the right side.  Reproductive: Prostate gland and seminal vesicles are unremarkable in appearance.  Other: Mild edema throughout the retroperitoneum. No significant volume of ascites. No pneumoperitoneum.  Musculoskeletal: There are no aggressive appearing lytic or blastic lesions noted in the visualized portions of the skeleton.  IMPRESSION: 1. Extensive lymphadenopathy throughout the chest, abdomen and pelvis, as above, compatible with recurrent lymphoma. 2.  Asymmetric somewhat mass-like enlargement in the region of the left pharyngeal tonsil. Correlation with direct inspection is recommended to exclude the possibility of underlying neoplasm. 3. Atherosclerosis, including left main and 2 vessel coronary artery disease. Assessment for potential risk factor modification, dietary therapy or pharmacologic therapy may be warranted, if clinically indicated. 4. Colonic diverticulosis without evidence of acute diverticulitis at this time. 5. Additional incidental findings, as above.   Electronically Signed   By: Vinnie Langton M.D.   On: 04/10/2015 10:23   Ct Chest W Contrast  04/10/2015   CLINICAL DATA:  Subsequent evaluation of a 74 year old male with history of lymphoma diagnosed 2 years ago status post chemotherapy and radiation therapy, complaining of weakness, nausea, vomiting,  shortness of breath and chest pain for 1 week.  EXAM: CT OF THE NECK WITH CONTRAST  CT OF THE CHEST WITH CONTRAST  CT OF THE ABDOMEN AND PELVIS WITH CONTRAST  TECHNIQUE: Multidetector CT imaging of the neck was performed with intravenous contrast.; Multidetector CT imaging of the abdomen and pelvis was performed following the standard protocol during bolus administration of intravenous contrast.; Multidetector CT imaging of the chest was performed following the standard protocol during bolus administration of intravenous contrast.  CONTRAST:  121m OMNIPAQUE IOHEXOL 350 MG/ML SOLN  COMPARISON:  PET-CT 12/22/2012  FINDINGS: CT NECK FINDINGS  Multiple prominent but nonenlarged lymph nodes are noted in the cervical regions bilaterally, largest of which is a right-sided submandibular lymph node measuring up to 8 mm in short axis. These have a normal appearing fatty hila, and are considered benign. Asymmetric somewhat mass-like appearing area in the region of the left pharyngeal tonsil, difficult to measure separate from the adjacent uvula, and partially obscured by beam hardening artifact from  dental hardware.  CT CHEST FINDINGS  Mediastinum/Lymph Nodes: Heart size is normal. There is no significant pericardial fluid, thickening or pericardial calcification. There is atherosclerosis of the thoracic aorta, the great vessels of the mediastinum and the coronary arteries, including calcified atherosclerotic plaque in the left main, left anterior descending and right coronary arteries. Mildly enlarged low right paratracheal lymph node measuring 1 cm in short axis. Borderline enlarged AP window lymph node measuring 9 mm in short axis. Mildly enlarged lymph nodes in the hilar regions bilaterally measuring up to 1 cm. Esophagus is normal in appearance. Extensive bilateral axillary and subpectoral lymphadenopathy (Right greater than left) measuring up to 2.5 cm in the right axilla.  Lungs/Pleura: Mild scarring in the inferior segment of the lingula. No suspicious appearing pulmonary nodules or masses. No acute consolidative airspace disease. No pleural effusions.  Musculoskeletal/Soft Tissues: There are no aggressive appearing lytic or blastic lesions noted in the visualized portions of the skeleton.  CT ABDOMEN AND PELVIS FINDINGS  Hepatobiliary: Mild periportal edema. No suspicious cystic or solid hepatic lesions. No intra or extrahepatic biliary ductal dilatation. Gallbladder is nearly completely decompressed, but otherwise unremarkable in appearance.  Pancreas: No pancreatic mass. No pancreatic ductal dilatation. No pancreatic or peripancreatic fluid or inflammatory changes.  Spleen: Normal in size.  Unremarkable.  Adrenals/Urinary Tract: Bilateral adrenal glands are normal in appearance. Multiple well-circumscribed low-attenuation lesions associated with the kidneys bilaterally, compatible with simple cysts. No suspicious renal lesions. No hydroureteronephrosis. Urinary bladder is normal in appearance.  Stomach/Bowel: Normal appearance of the stomach. No pathologic dilatation of small bowel or colon. Numerous  colonic diverticulae are noted, without surrounding inflammatory changes to suggest an acute diverticulitis at this time. Normal appendix.  Vascular/Lymphatic: Mild atherosclerosis throughout the abdominal and pelvic vasculature, without evidence of aneurysm or dissection. Extensive lymphadenopathy noted throughout the abdomen and pelvis. Specific examples include a 1.5 cm short axis portacaval lymph node, mildly enlarged left common iliac lymph node measuring 11 mm, bilateral obturator lymph nodes (right greater than left), measuring up to 1.9 cm on the right side, and bilateral inguinal lymph nodes (right greater than left), measuring up to 2.1 cm on the right side.  Reproductive: Prostate gland and seminal vesicles are unremarkable in appearance.  Other: Mild edema throughout the retroperitoneum. No significant volume of ascites. No pneumoperitoneum.  Musculoskeletal: There are no aggressive appearing lytic or blastic lesions noted in the visualized portions of the skeleton.  IMPRESSION: 1. Extensive lymphadenopathy throughout the chest,  abdomen and pelvis, as above, compatible with recurrent lymphoma. 2. Asymmetric somewhat mass-like enlargement in the region of the left pharyngeal tonsil. Correlation with direct inspection is recommended to exclude the possibility of underlying neoplasm. 3. Atherosclerosis, including left main and 2 vessel coronary artery disease. Assessment for potential risk factor modification, dietary therapy or pharmacologic therapy may be warranted, if clinically indicated. 4. Colonic diverticulosis without evidence of acute diverticulitis at this time. 5. Additional incidental findings, as above.   Electronically Signed   By: Vinnie Langton M.D.   On: 04/10/2015 10:23   Ct Abdomen Pelvis W Contrast  04/10/2015   CLINICAL DATA:  Subsequent evaluation of a 74 year old male with history of lymphoma diagnosed 2 years ago status post chemotherapy and radiation therapy, complaining of  weakness, nausea, vomiting, shortness of breath and chest pain for 1 week.  EXAM: CT OF THE NECK WITH CONTRAST  CT OF THE CHEST WITH CONTRAST  CT OF THE ABDOMEN AND PELVIS WITH CONTRAST  TECHNIQUE: Multidetector CT imaging of the neck was performed with intravenous contrast.; Multidetector CT imaging of the abdomen and pelvis was performed following the standard protocol during bolus administration of intravenous contrast.; Multidetector CT imaging of the chest was performed following the standard protocol during bolus administration of intravenous contrast.  CONTRAST:  184m OMNIPAQUE IOHEXOL 350 MG/ML SOLN  COMPARISON:  PET-CT 12/22/2012  FINDINGS: CT NECK FINDINGS  Multiple prominent but nonenlarged lymph nodes are noted in the cervical regions bilaterally, largest of which is a right-sided submandibular lymph node measuring up to 8 mm in short axis. These have a normal appearing fatty hila, and are considered benign. Asymmetric somewhat mass-like appearing area in the region of the left pharyngeal tonsil, difficult to measure separate from the adjacent uvula, and partially obscured by beam hardening artifact from dental hardware.  CT CHEST FINDINGS  Mediastinum/Lymph Nodes: Heart size is normal. There is no significant pericardial fluid, thickening or pericardial calcification. There is atherosclerosis of the thoracic aorta, the great vessels of the mediastinum and the coronary arteries, including calcified atherosclerotic plaque in the left main, left anterior descending and right coronary arteries. Mildly enlarged low right paratracheal lymph node measuring 1 cm in short axis. Borderline enlarged AP window lymph node measuring 9 mm in short axis. Mildly enlarged lymph nodes in the hilar regions bilaterally measuring up to 1 cm. Esophagus is normal in appearance. Extensive bilateral axillary and subpectoral lymphadenopathy (Right greater than left) measuring up to 2.5 cm in the right axilla.  Lungs/Pleura: Mild  scarring in the inferior segment of the lingula. No suspicious appearing pulmonary nodules or masses. No acute consolidative airspace disease. No pleural effusions.  Musculoskeletal/Soft Tissues: There are no aggressive appearing lytic or blastic lesions noted in the visualized portions of the skeleton.  CT ABDOMEN AND PELVIS FINDINGS  Hepatobiliary: Mild periportal edema. No suspicious cystic or solid hepatic lesions. No intra or extrahepatic biliary ductal dilatation. Gallbladder is nearly completely decompressed, but otherwise unremarkable in appearance.  Pancreas: No pancreatic mass. No pancreatic ductal dilatation. No pancreatic or peripancreatic fluid or inflammatory changes.  Spleen: Normal in size.  Unremarkable.  Adrenals/Urinary Tract: Bilateral adrenal glands are normal in appearance. Multiple well-circumscribed low-attenuation lesions associated with the kidneys bilaterally, compatible with simple cysts. No suspicious renal lesions. No hydroureteronephrosis. Urinary bladder is normal in appearance.  Stomach/Bowel: Normal appearance of the stomach. No pathologic dilatation of small bowel or colon. Numerous colonic diverticulae are noted, without surrounding inflammatory changes to suggest an acute diverticulitis at this  time. Normal appendix.  Vascular/Lymphatic: Mild atherosclerosis throughout the abdominal and pelvic vasculature, without evidence of aneurysm or dissection. Extensive lymphadenopathy noted throughout the abdomen and pelvis. Specific examples include a 1.5 cm short axis portacaval lymph node, mildly enlarged left common iliac lymph node measuring 11 mm, bilateral obturator lymph nodes (right greater than left), measuring up to 1.9 cm on the right side, and bilateral inguinal lymph nodes (right greater than left), measuring up to 2.1 cm on the right side.  Reproductive: Prostate gland and seminal vesicles are unremarkable in appearance.  Other: Mild edema throughout the retroperitoneum. No  significant volume of ascites. No pneumoperitoneum.  Musculoskeletal: There are no aggressive appearing lytic or blastic lesions noted in the visualized portions of the skeleton.  IMPRESSION: 1. Extensive lymphadenopathy throughout the chest, abdomen and pelvis, as above, compatible with recurrent lymphoma. 2. Asymmetric somewhat mass-like enlargement in the region of the left pharyngeal tonsil. Correlation with direct inspection is recommended to exclude the possibility of underlying neoplasm. 3. Atherosclerosis, including left main and 2 vessel coronary artery disease. Assessment for potential risk factor modification, dietary therapy or pharmacologic therapy may be warranted, if clinically indicated. 4. Colonic diverticulosis without evidence of acute diverticulitis at this time. 5. Additional incidental findings, as above.   Electronically Signed   By: Vinnie Langton M.D.   On: 04/10/2015 10:23   [place] Core Biopsy  04/14/2015   CLINICAL DATA:  Adenopathy  EXAM: ULTRASOUND-GUIDED BIOPSY RIGHT AXILLARY LYMPH NODE CORE BIOPSY  MEDICATIONS AND MEDICAL HISTORY: Versed 1 mg, Fentanyl 50 mcg.  Additional Medications: None.  ANESTHESIA/SEDATION: Moderate sedation time: 7 minutes  PROCEDURE: The procedure, risks, benefits, and alternatives were explained to the patient. Questions regarding the procedure were encouraged and answered. The patient understands and consents to the procedure.  The right axilla was prepped with ChloraPrep in a sterile fashion, and a sterile drape was applied covering the operative field. A sterile gown and sterile gloves were used for the procedure.  Under sonographic guidance, 6 18 gauge core biopsies of an enlarged right axillary lymph node were obtained. Final imaging was performed.  Patient tolerated the procedure well without complication. Vital sign monitoring by nursing staff during the procedure will continue as patient is in the special procedures unit for post procedure observation.   FINDINGS: The images document guide needle placement within the right axillary lymph node. Post biopsy images demonstrate no hemorrhage.  COMPLICATIONS: None.  IMPRESSION: Successful ultrasound-guided right axillary lymph node core biopsy.   Electronically Signed   By: Marybelle Killings M.D.   On: 04/14/2015 14:22   Ct Biopsy  04/14/2015   CLINICAL DATA:  Widespread adenopathy.  History of the lymphoma.  EXAM: CT-GUIDED BIOPSY BONE MARROW ASPIRATE AND CORE.  MEDICATIONS AND MEDICAL HISTORY: Versed 2.5 mg, Fentanyl 100 mcg.  Additional Medications: None.  ANESTHESIA/SEDATION: Moderate sedation time: 17 minutes  PROCEDURE: The procedure, risks, benefits, and alternatives were explained to the patient. Questions regarding the procedure were encouraged and answered. The patient understands and consents to the procedure.  The back was prepped with ChloraPrep in a sterile fashion, and a sterile drape was applied covering the operative field. A sterile gown and sterile gloves were used for the procedure.  Under CT guidance, an 11 gauge needle was inserted into the right iliac bone. Aspirates and a core were obtained. Final imaging was performed.  Patient tolerated the procedure well without complication. Vital sign monitoring by nursing staff during the procedure will continue as patient is in  the special procedures unit for post procedure observation.  FINDINGS: The images document guide needle placement within the right iliac bone. Post biopsy images demonstrate no hemorrhage.  COMPLICATIONS: None  IMPRESSION: Successful CT-guided bone marrow aspirate and core.   Electronically Signed   By: Marybelle Killings M.D.   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  PLAN:    1. Lymphoma: CT scan results reported as above and reviewed independently likely indicating recurrence of his lymphoma. Patient had both biopsy of this lymph node as well as bone marrow biopsy today.  Appreciate interventional radiology assistance.  Will also consider a PET scan in the near future for staging purposes. Await final pathology results and consider reinitiating chemotherapy in the near future. 2. Pain: Continue Percocet as needed, patient will also receive IV morphine as needed. 3. Fevers/dehydration: Tylenol as needed, continue IV fluids as well. 4. Disposition:  Probable discharge home tomorrow.   Lloyd Huger, MD   04/14/2015 5:54 PM

## 2015-04-14 NOTE — Plan of Care (Signed)
Problem: Discharge Progression Outcomes Goal: Other Discharge Outcomes/Goals Outcome: Progressing Plan of care progress to goal: Pain - only complaint was a headache, tylenol relieved Hemodynamically stable - VSS Diet - pt NPO after midnight for biopsy Activity - pt stand by assist

## 2015-04-15 DIAGNOSIS — C8330 Diffuse large B-cell lymphoma, unspecified site: Secondary | ICD-10-CM | POA: Diagnosis not present

## 2015-04-15 LAB — COMPREHENSIVE METABOLIC PANEL
ALBUMIN: 2.8 g/dL — AB (ref 3.5–5.0)
ALK PHOS: 441 U/L — AB (ref 38–126)
ALT: 77 U/L — ABNORMAL HIGH (ref 17–63)
ANION GAP: 6 (ref 5–15)
AST: 39 U/L (ref 15–41)
BUN: 8 mg/dL (ref 6–20)
CO2: 27 mmol/L (ref 22–32)
CREATININE: 0.84 mg/dL (ref 0.61–1.24)
Calcium: 8.2 mg/dL — ABNORMAL LOW (ref 8.9–10.3)
Chloride: 102 mmol/L (ref 101–111)
GFR calc Af Amer: >60 mL/min (ref >60)
GFR calc non Af Amer: >60 mL/min (ref >60)
Glucose, Bld: 105 mg/dL — ABNORMAL HIGH (ref 65–99)
POTASSIUM: 4.3 mmol/L (ref 3.5–5.1)
Sodium: 135 mmol/L (ref 135–145)
TOTAL PROTEIN: 5.8 g/dL — AB (ref 6.5–8.1)
Total Bilirubin: 1.2 mg/dL (ref 0.3–1.2)

## 2015-04-15 MED ORDER — OMEPRAZOLE 40 MG PO CPDR
40.0000 mg | DELAYED_RELEASE_CAPSULE | Freq: Every day | ORAL | Status: DC
Start: 2015-04-15 — End: 2015-07-26

## 2015-04-15 MED ORDER — PREDNISONE 50 MG PO TABS: ORAL_TABLET | ORAL | Status: DC

## 2015-04-15 MED ORDER — SENNOSIDES-DOCUSATE SODIUM 8.6-50 MG PO TABS: 1.0000 | ORAL_TABLET | Freq: Every evening | ORAL | Status: DC | PRN

## 2015-04-15 MED ORDER — ONDANSETRON HCL 4 MG PO TABS: 4.0000 mg | ORAL_TABLET | Freq: Three times a day (TID) | ORAL | Status: DC | PRN

## 2015-04-15 MED ORDER — HYDROMORPHONE HCL 4 MG PO TABS: 4.0000 mg | ORAL_TABLET | ORAL | Status: DC | PRN

## 2015-04-15 NOTE — Progress Notes (Signed)
Discharge instructions given and went over with patient. Prescriptions given and in hand. Patient discharged home with wife via wheelchair by nursing staff. Madlyn Frankel, RN

## 2015-04-15 NOTE — Plan of Care (Signed)
Problem: Discharge Progression Outcomes Goal: Other Discharge Outcomes/Goals Outcome: Progressing Pt alert and oriented, VSS, no complaints of pain this shift, hoping to discharge home today

## 2015-04-15 NOTE — Progress Notes (Signed)
Called to room by patient he sates the he hit his leg on counter when trying to go to the bathroom. His wife asked to speak to me on the phone, she seems concerned that he "fell" however she is not here and did not witness anything. When speaking to and accessing the patient he states that he did not "fall" he got tangled in the blankets and hit his knee, there are no visible injuries, and patient is not concerned. Biopsy sites were checked as well as patients knees and legs, he has no pain at this time.  To be cautious as patient was moderate fall risk and independent he was asked to call to get out of bed and bed alarm was set. No further interventions at this time.

## 2015-04-15 NOTE — Discharge Summary (Signed)
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  He also has remained afebrile throughout his admission. He continues to have tenderness at the site of his bone marrow biopsy and lymph node biopsy. He is mildly lethargic, but otherwise feels well. He offers no further specific complaints.  REVIEW OF SYSTEMS:   Review of Systems  Constitutional: Positive for malaise/fatigue. Negative for fever.  Respiratory: Negative.   Cardiovascular: Negative.   Gastrointestinal: Positive for nausea and abdominal pain.  Neurological: Positive for weakness.    As per HPI. Otherwise, a complete review of systems is negatve.  PAST MEDICAL HISTORY: Past Medical History  Diagnosis Date  . Stroke   . Seizures   . Hypercholesteremia   . HTN (hypertension)   . Cancer     T-Cell Lymphoma    PAST SURGICAL HISTORY: History reviewed. No pertinent past surgical history.  FAMILY HISTORY History reviewed. No pertinent family history.     ADVANCED DIRECTIVES:    HEALTH MAINTENANCE: History  Substance Use Topics  . Smoking status: Former Research scientist (life sciences)  . Smokeless tobacco: Not on file     Comment: Quit  in 1990  . Alcohol Use: Yes     Comment: Occassional glass of wine     Colonoscopy:  PAP:  Bone density:  Lipid panel:  Allergies  Allergen Reactions  . No Known Allergies     Current Facility-Administered Medications  Medication Dose Route Frequency Provider Last Rate Last Dose  . 0.9 %  sodium chloride infusion  250 mL Intravenous PRN Lloyd Huger, MD      . 0.9 %  sodium chloride infusion   Intravenous Continuous Lloyd Huger, MD 125 mL/hr at 04/15/15 1027    . acetaminophen (TYLENOL) tablet 650 mg  650 mg Oral Q4H PRN Lloyd Huger, MD   650 mg at 04/14/15 1804  . alum & mag hydroxide-simeth (MAALOX/MYLANTA) 200-200-20 MG/5ML suspension 60 mL  60 mL Oral Q4H PRN Lloyd Huger, MD      . enoxaparin (LOVENOX) injection 40 mg  40 mg Subcutaneous Q24H Lloyd Huger, MD   Stopped at 04/13/15 1730  . guaiFENesin-dextromethorphan (ROBITUSSIN DM) 100-10 MG/5ML syrup 10 mL  10 mL Oral Q4H PRN Lloyd Huger, MD      . hydrocortisone (ANUSOL-HC) 2.5 % rectal cream 1 application  1 application Rectal BID PRN Lloyd Huger, MD      . losartan (COZAAR) tablet 50 mg  50 mg Oral Daily Lloyd Huger, MD   50 mg at 04/15/15 1023  . morphine 2 MG/ML injection 2 mg  2 mg Intravenous Q2H PRN Lloyd Huger, MD   2 mg at 04/15/15 1114  . ondansetron (ZOFRAN) tablet 4-8 mg  4-8 mg Oral Q8H PRN Lloyd Huger, MD       Or  . ondansetron (ZOFRAN-ODT) disintegrating tablet 4-8 mg  4-8 mg Oral Q8H PRN Lloyd Huger, MD       Or  . ondansetron [facility]) injection 4 mg  4 mg Intravenous Q8H PRN Lloyd Huger, MD  Or  . ondansetron (ZOFRAN) 8 mg in sodium chloride 0.9 % 50 mL IVPB  8 mg Intravenous Q8H PRN Lloyd Huger, MD      . oxyCODONE-acetaminophen (PERCOCET/ROXICET) 5-325 MG per tablet 1 tablet  1 tablet Oral Q4H PRN Lloyd Huger, MD      . senna-docusate (Senokot-S) tablet 1 tablet  1 tablet Oral QHS PRN Lloyd Huger, MD      . sodium chloride 0.9 % injection 3 mL  3 mL Intravenous Q12H Lloyd Huger, MD   3 mL at 04/13/15 2145  . sodium chloride 0.9 % injection 3 mL  3 mL Intravenous PRN Lloyd Huger, MD       Facility-Administered Medications Ordered in Other Encounters  Medication Dose Route Frequency Provider Last Rate Last Dose  . acetaminophen (TYLENOL)  tablet 650 mg  650 mg Oral Q4H PRN Lloyd Huger, MD        OBJECTIVE: Filed Vitals:   04/15/15 1245  BP: 126/63  Pulse: 90  Temp: 98.8 F (37.1 C)  Resp: 19     Body mass index is 28.33 kg/(m^2).    ECOG FS:1 - Symptomatic but completely ambulatory  General: Well-developed, well-nourished, no acute distress. Eyes: Pink conjunctiva, anicteric sclera. Lungs: Clear to auscultation bilaterally. Heart: Regular rate and rhythm. No rubs, murmurs, or gallops. Abdomen: Soft, nontender, nondistended. No organomegaly noted, normoactive bowel sounds. Musculoskeletal: No edema, cyanosis, or clubbing. Neuro: Alert, answering all questions appropriately. Cranial nerves grossly intact. Skin: No rashes or petechiae noted. Psych: Normal affect.   LAB RESULTS:  Lab Results  Component Value Date   NA 135 04/15/2015   K 4.3 04/15/2015   CL 102 04/15/2015   CO2 27 04/15/2015   GLUCOSE 105* 04/15/2015   BUN 8 04/15/2015   CREATININE 0.84 04/15/2015   CALCIUM 8.2* 04/15/2015   PROT 5.8* 04/15/2015   ALBUMIN 2.8* 04/15/2015   AST 39 04/15/2015   ALT 77* 04/15/2015   ALKPHOS 441* 04/15/2015   BILITOT 1.2 04/15/2015   GFRNONAA >60 04/15/2015   GFRAA >60 04/15/2015    Lab Results  Component Value Date   WBC 7.9 04/14/2015   NEUTROABS 6.1 04/13/2015   HGB 13.5 04/14/2015   HCT 41.4 04/14/2015   MCV 90.6 04/14/2015   PLT 315 04/14/2015     STUDIES: Ct Soft Tissue Neck W Contrast  04/10/2015   CLINICAL DATA:  Subsequent evaluation of a 74 year old male with history of lymphoma diagnosed 2 years ago status post chemotherapy and radiation therapy, complaining of weakness, nausea, vomiting, shortness of breath and chest pain for 1 week.  EXAM: CT OF THE NECK WITH CONTRAST  CT OF THE CHEST WITH CONTRAST  CT OF THE ABDOMEN AND PELVIS WITH CONTRAST  TECHNIQUE: Multidetector CT imaging of the neck was performed with intravenous contrast.; Multidetector CT imaging of the abdomen and pelvis  was performed following the standard protocol during bolus administration of intravenous contrast.; Multidetector CT imaging of the chest was performed following the standard protocol during bolus administration of intravenous contrast.  CONTRAST:  156m OMNIPAQUE IOHEXOL 350 MG/ML SOLN  COMPARISON:  PET-CT 12/22/2012  FINDINGS: CT NECK FINDINGS  Multiple prominent but nonenlarged lymph nodes are noted in the cervical regions bilaterally, largest of which is a right-sided submandibular lymph node measuring up to 8 mm in short axis. These have a normal appearing fatty hila, and are considered benign. Asymmetric somewhat mass-like appearing area in the region of the left pharyngeal tonsil,  difficult to measure separate from the adjacent uvula, and partially obscured by beam hardening artifact from dental hardware.  CT CHEST FINDINGS  Mediastinum/Lymph Nodes: Heart size is normal. There is no significant pericardial fluid, thickening or pericardial calcification. There is atherosclerosis of the thoracic aorta, the great vessels of the mediastinum and the coronary arteries, including calcified atherosclerotic plaque in the left main, left anterior descending and right coronary arteries. Mildly enlarged low right paratracheal lymph node measuring 1 cm in short axis. Borderline enlarged AP window lymph node measuring 9 mm in short axis. Mildly enlarged lymph nodes in the hilar regions bilaterally measuring up to 1 cm. Esophagus is normal in appearance. Extensive bilateral axillary and subpectoral lymphadenopathy (Right greater than left) measuring up to 2.5 cm in the right axilla.  Lungs/Pleura: Mild scarring in the inferior segment of the lingula. No suspicious appearing pulmonary nodules or masses. No acute consolidative airspace disease. No pleural effusions.  Musculoskeletal/Soft Tissues: There are no aggressive appearing lytic or blastic lesions noted in the visualized portions of the skeleton.  CT ABDOMEN AND PELVIS  FINDINGS  Hepatobiliary: Mild periportal edema. No suspicious cystic or solid hepatic lesions. No intra or extrahepatic biliary ductal dilatation. Gallbladder is nearly completely decompressed, but otherwise unremarkable in appearance.  Pancreas: No pancreatic mass. No pancreatic ductal dilatation. No pancreatic or peripancreatic fluid or inflammatory changes.  Spleen: Normal in size.  Unremarkable.  Adrenals/Urinary Tract: Bilateral adrenal glands are normal in appearance. Multiple well-circumscribed low-attenuation lesions associated with the kidneys bilaterally, compatible with simple cysts. No suspicious renal lesions. No hydroureteronephrosis. Urinary bladder is normal in appearance.  Stomach/Bowel: Normal appearance of the stomach. No pathologic dilatation of small bowel or colon. Numerous colonic diverticulae are noted, without surrounding inflammatory changes to suggest an acute diverticulitis at this time. Normal appendix.  Vascular/Lymphatic: Mild atherosclerosis throughout the abdominal and pelvic vasculature, without evidence of aneurysm or dissection. Extensive lymphadenopathy noted throughout the abdomen and pelvis. Specific examples include a 1.5 cm short axis portacaval lymph node, mildly enlarged left common iliac lymph node measuring 11 mm, bilateral obturator lymph nodes (right greater than left), measuring up to 1.9 cm on the right side, and bilateral inguinal lymph nodes (right greater than left), measuring up to 2.1 cm on the right side.  Reproductive: Prostate gland and seminal vesicles are unremarkable in appearance.  Other: Mild edema throughout the retroperitoneum. No significant volume of ascites. No pneumoperitoneum.  Musculoskeletal: There are no aggressive appearing lytic or blastic lesions noted in the visualized portions of the skeleton.  IMPRESSION: 1. Extensive lymphadenopathy throughout the chest, abdomen and pelvis, as above, compatible with recurrent lymphoma. 2. Asymmetric  somewhat mass-like enlargement in the region of the left pharyngeal tonsil. Correlation with direct inspection is recommended to exclude the possibility of underlying neoplasm. 3. Atherosclerosis, including left main and 2 vessel coronary artery disease. Assessment for potential risk factor modification, dietary therapy or pharmacologic therapy may be warranted, if clinically indicated. 4. Colonic diverticulosis without evidence of acute diverticulitis at this time. 5. Additional incidental findings, as above.   Electronically Signed   By: Vinnie Langton M.D.   On: 04/10/2015 10:23   Ct Chest W Contrast  04/10/2015   CLINICAL DATA:  Subsequent evaluation of a 74 year old male with history of lymphoma diagnosed 2 years ago status post chemotherapy and radiation therapy, complaining of weakness, nausea, vomiting, shortness of breath and chest pain for 1 week.  EXAM: CT OF THE NECK WITH CONTRAST  CT OF THE CHEST WITH CONTRAST  CT OF THE ABDOMEN AND PELVIS WITH CONTRAST  TECHNIQUE: Multidetector CT imaging of the neck was performed with intravenous contrast.; Multidetector CT imaging of the abdomen and pelvis was performed following the standard protocol during bolus administration of intravenous contrast.; Multidetector CT imaging of the chest was performed following the standard protocol during bolus administration of intravenous contrast.  CONTRAST:  148m OMNIPAQUE IOHEXOL 350 MG/ML SOLN  COMPARISON:  PET-CT 12/22/2012  FINDINGS: CT NECK FINDINGS  Multiple prominent but nonenlarged lymph nodes are noted in the cervical regions bilaterally, largest of which is a right-sided submandibular lymph node measuring up to 8 mm in short axis. These have a normal appearing fatty hila, and are considered benign. Asymmetric somewhat mass-like appearing area in the region of the left pharyngeal tonsil, difficult to measure separate from the adjacent uvula, and partially obscured by beam hardening artifact from dental  hardware.  CT CHEST FINDINGS  Mediastinum/Lymph Nodes: Heart size is normal. There is no significant pericardial fluid, thickening or pericardial calcification. There is atherosclerosis of the thoracic aorta, the great vessels of the mediastinum and the coronary arteries, including calcified atherosclerotic plaque in the left main, left anterior descending and right coronary arteries. Mildly enlarged low right paratracheal lymph node measuring 1 cm in short axis. Borderline enlarged AP window lymph node measuring 9 mm in short axis. Mildly enlarged lymph nodes in the hilar regions bilaterally measuring up to 1 cm. Esophagus is normal in appearance. Extensive bilateral axillary and subpectoral lymphadenopathy (Right greater than left) measuring up to 2.5 cm in the right axilla.  Lungs/Pleura: Mild scarring in the inferior segment of the lingula. No suspicious appearing pulmonary nodules or masses. No acute consolidative airspace disease. No pleural effusions.  Musculoskeletal/Soft Tissues: There are no aggressive appearing lytic or blastic lesions noted in the visualized portions of the skeleton.  CT ABDOMEN AND PELVIS FINDINGS  Hepatobiliary: Mild periportal edema. No suspicious cystic or solid hepatic lesions. No intra or extrahepatic biliary ductal dilatation. Gallbladder is nearly completely decompressed, but otherwise unremarkable in appearance.  Pancreas: No pancreatic mass. No pancreatic ductal dilatation. No pancreatic or peripancreatic fluid or inflammatory changes.  Spleen: Normal in size.  Unremarkable.  Adrenals/Urinary Tract: Bilateral adrenal glands are normal in appearance. Multiple well-circumscribed low-attenuation lesions associated with the kidneys bilaterally, compatible with simple cysts. No suspicious renal lesions. No hydroureteronephrosis. Urinary bladder is normal in appearance.  Stomach/Bowel: Normal appearance of the stomach. No pathologic dilatation of small bowel or colon. Numerous  colonic diverticulae are noted, without surrounding inflammatory changes to suggest an acute diverticulitis at this time. Normal appendix.  Vascular/Lymphatic: Mild atherosclerosis throughout the abdominal and pelvic vasculature, without evidence of aneurysm or dissection. Extensive lymphadenopathy noted throughout the abdomen and pelvis. Specific examples include a 1.5 cm short axis portacaval lymph node, mildly enlarged left common iliac lymph node measuring 11 mm, bilateral obturator lymph nodes (right greater than left), measuring up to 1.9 cm on the right side, and bilateral inguinal lymph nodes (right greater than left), measuring up to 2.1 cm on the right side.  Reproductive: Prostate gland and seminal vesicles are unremarkable in appearance.  Other: Mild edema throughout the retroperitoneum. No significant volume of ascites. No pneumoperitoneum.  Musculoskeletal: There are no aggressive appearing lytic or blastic lesions noted in the visualized portions of the skeleton.  IMPRESSION: 1. Extensive lymphadenopathy throughout the chest, abdomen and pelvis, as above, compatible with recurrent lymphoma. 2. Asymmetric somewhat mass-like enlargement in the region of the left pharyngeal tonsil. Correlation with direct  inspection is recommended to exclude the possibility of underlying neoplasm. 3. Atherosclerosis, including left main and 2 vessel coronary artery disease. Assessment for potential risk factor modification, dietary therapy or pharmacologic therapy may be warranted, if clinically indicated. 4. Colonic diverticulosis without evidence of acute diverticulitis at this time. 5. Additional incidental findings, as above.   Electronically Signed   By: Vinnie Langton M.D.   On: 04/10/2015 10:23   Ct Abdomen Pelvis W Contrast  04/10/2015   CLINICAL DATA:  Subsequent evaluation of a 74 year old male with history of lymphoma diagnosed 2 years ago status post chemotherapy and radiation therapy, complaining of  weakness, nausea, vomiting, shortness of breath and chest pain for 1 week.  EXAM: CT OF THE NECK WITH CONTRAST  CT OF THE CHEST WITH CONTRAST  CT OF THE ABDOMEN AND PELVIS WITH CONTRAST  TECHNIQUE: Multidetector CT imaging of the neck was performed with intravenous contrast.; Multidetector CT imaging of the abdomen and pelvis was performed following the standard protocol during bolus administration of intravenous contrast.; Multidetector CT imaging of the chest was performed following the standard protocol during bolus administration of intravenous contrast.  CONTRAST:  124m OMNIPAQUE IOHEXOL 350 MG/ML SOLN  COMPARISON:  PET-CT 12/22/2012  FINDINGS: CT NECK FINDINGS  Multiple prominent but nonenlarged lymph nodes are noted in the cervical regions bilaterally, largest of which is a right-sided submandibular lymph node measuring up to 8 mm in short axis. These have a normal appearing fatty hila, and are considered benign. Asymmetric somewhat mass-like appearing area in the region of the left pharyngeal tonsil, difficult to measure separate from the adjacent uvula, and partially obscured by beam hardening artifact from dental hardware.  CT CHEST FINDINGS  Mediastinum/Lymph Nodes: Heart size is normal. There is no significant pericardial fluid, thickening or pericardial calcification. There is atherosclerosis of the thoracic aorta, the great vessels of the mediastinum and the coronary arteries, including calcified atherosclerotic plaque in the left main, left anterior descending and right coronary arteries. Mildly enlarged low right paratracheal lymph node measuring 1 cm in short axis. Borderline enlarged AP window lymph node measuring 9 mm in short axis. Mildly enlarged lymph nodes in the hilar regions bilaterally measuring up to 1 cm. Esophagus is normal in appearance. Extensive bilateral axillary and subpectoral lymphadenopathy (Right greater than left) measuring up to 2.5 cm in the right axilla.  Lungs/Pleura: Mild  scarring in the inferior segment of the lingula. No suspicious appearing pulmonary nodules or masses. No acute consolidative airspace disease. No pleural effusions.  Musculoskeletal/Soft Tissues: There are no aggressive appearing lytic or blastic lesions noted in the visualized portions of the skeleton.  CT ABDOMEN AND PELVIS FINDINGS  Hepatobiliary: Mild periportal edema. No suspicious cystic or solid hepatic lesions. No intra or extrahepatic biliary ductal dilatation. Gallbladder is nearly completely decompressed, but otherwise unremarkable in appearance.  Pancreas: No pancreatic mass. No pancreatic ductal dilatation. No pancreatic or peripancreatic fluid or inflammatory changes.  Spleen: Normal in size.  Unremarkable.  Adrenals/Urinary Tract: Bilateral adrenal glands are normal in appearance. Multiple well-circumscribed low-attenuation lesions associated with the kidneys bilaterally, compatible with simple cysts. No suspicious renal lesions. No hydroureteronephrosis. Urinary bladder is normal in appearance.  Stomach/Bowel: Normal appearance of the stomach. No pathologic dilatation of small bowel or colon. Numerous colonic diverticulae are noted, without surrounding inflammatory changes to suggest an acute diverticulitis at this time. Normal appendix.  Vascular/Lymphatic: Mild atherosclerosis throughout the abdominal and pelvic vasculature, without evidence of aneurysm or dissection. Extensive lymphadenopathy noted throughout the abdomen  and pelvis. Specific examples include a 1.5 cm short axis portacaval lymph node, mildly enlarged left common iliac lymph node measuring 11 mm, bilateral obturator lymph nodes (right greater than left), measuring up to 1.9 cm on the right side, and bilateral inguinal lymph nodes (right greater than left), measuring up to 2.1 cm on the right side.  Reproductive: Prostate gland and seminal vesicles are unremarkable in appearance.  Other: Mild edema throughout the retroperitoneum. No  significant volume of ascites. No pneumoperitoneum.  Musculoskeletal: There are no aggressive appearing lytic or blastic lesions noted in the visualized portions of the skeleton.  IMPRESSION: 1. Extensive lymphadenopathy throughout the chest, abdomen and pelvis, as above, compatible with recurrent lymphoma. 2. Asymmetric somewhat mass-like enlargement in the region of the left pharyngeal tonsil. Correlation with direct inspection is recommended to exclude the possibility of underlying neoplasm. 3. Atherosclerosis, including left main and 2 vessel coronary artery disease. Assessment for potential risk factor modification, dietary therapy or pharmacologic therapy may be warranted, if clinically indicated. 4. Colonic diverticulosis without evidence of acute diverticulitis at this time. 5. Additional incidental findings, as above.   Electronically Signed   By: Vinnie Langton M.D.   On: 04/10/2015 10:23   [place] Core Biopsy  04/14/2015   CLINICAL DATA:  Adenopathy  EXAM: ULTRASOUND-GUIDED BIOPSY RIGHT AXILLARY LYMPH NODE CORE BIOPSY  MEDICATIONS AND MEDICAL HISTORY: Versed 1 mg, Fentanyl 50 mcg.  Additional Medications: None.  ANESTHESIA/SEDATION: Moderate sedation time: 7 minutes  PROCEDURE: The procedure, risks, benefits, and alternatives were explained to the patient. Questions regarding the procedure were encouraged and answered. The patient understands and consents to the procedure.  The right axilla was prepped with ChloraPrep in a sterile fashion, and a sterile drape was applied covering the operative field. A sterile gown and sterile gloves were used for the procedure.  Under sonographic guidance, 6 18 gauge core biopsies of an enlarged right axillary lymph node were obtained. Final imaging was performed.  Patient tolerated the procedure well without complication. Vital sign monitoring by nursing staff during the procedure will continue as patient is in the special procedures unit for post procedure observation.   FINDINGS: The images document guide needle placement within the right axillary lymph node. Post biopsy images demonstrate no hemorrhage.  COMPLICATIONS: None.  IMPRESSION: Successful ultrasound-guided right axillary lymph node core biopsy.   Electronically Signed   By: Marybelle Killings M.D.   On: 04/14/2015 14:22   Ct Biopsy  04/14/2015   CLINICAL DATA:  Widespread adenopathy.  History of the lymphoma.  EXAM: CT-GUIDED BIOPSY BONE MARROW ASPIRATE AND CORE.  MEDICATIONS AND MEDICAL HISTORY: Versed 2.5 mg, Fentanyl 100 mcg.  Additional Medications: None.  ANESTHESIA/SEDATION: Moderate sedation time: 17 minutes  PROCEDURE: The procedure, risks, benefits, and alternatives were explained to the patient. Questions regarding the procedure were encouraged and answered. The patient understands and consents to the procedure.  The back was prepped with ChloraPrep in a sterile fashion, and a sterile drape was applied covering the operative field. A sterile gown and sterile gloves were used for the procedure.  Under CT guidance, an 11 gauge needle was inserted into the right iliac bone. Aspirates and a core were obtained. Final imaging was performed.  Patient tolerated the procedure well without complication. Vital sign monitoring by nursing staff during the procedure will continue as patient is in the special procedures unit for post procedure observation.  FINDINGS: The images document guide needle placement within the right iliac bone. Post biopsy images demonstrate  no hemorrhage.  COMPLICATIONS: None  IMPRESSION: Successful CT-guided bone marrow aspirate and core.   Electronically Signed   By: Marybelle Killings M.D.   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  PLAN:    1. Lymphoma: CT scan results reported as above and reviewed independently likely indicating recurrence of his lymphoma. Patient had both biopsy of this lymph node as well as bone marrow biopsy  yesterday. Appreciate interventional radiology assistance. Will also consider a PET scan in the near future for staging purposes. Await final pathology results and consider reinitiating chemotherapy in the near future.  Patient will also likely need port placement prior to initiating treatment. 2. Pain: Patient has asked for minimal IV narcotics this admission, therefore will discharge with prescription for Dilaudid as well as prednisone. 3. Reflux/nausea: Patient was given a prescription for omeprazole. 4. Fevers/dehydration: Resolved. 5. Disposition: Discharge home today. Patient will follow-up in the [facility] [place] later this week once final pathology is resulted.  Lloyd Huger, MD   04/15/2015 2:07 PM

## 2015-04-19 ENCOUNTER — Telehealth: Payer: Self-pay | Admitting: *Deleted

## 2015-04-19 ENCOUNTER — Other Ambulatory Visit: Payer: Self-pay | Admitting: Oncology

## 2015-04-19 DIAGNOSIS — C8448 Peripheral T-cell lymphoma, not classified, lymph nodes of multiple sites: Secondary | ICD-10-CM

## 2015-04-19 NOTE — Telephone Encounter (Signed)
Per Dr Grayland Ormond, this is being taken care of

## 2015-04-19 NOTE — Progress Notes (Signed)
This encounter was created in error - please disregard.

## 2015-04-24 ENCOUNTER — Ambulatory Visit
Admission: RE | Admit: 2015-04-24 | Discharge: 2015-04-24 | Disposition: A | Discharge status: Home / Self Care | Payer: Medicare Other | Source: Ambulatory Visit | Attending: Vascular Surgery | Admitting: Vascular Surgery

## 2015-04-24 ENCOUNTER — Encounter: Payer: Self-pay | Admitting: *Deleted

## 2015-04-24 ENCOUNTER — Encounter
Admission: RE | Disposition: A | Discharge status: Home / Self Care | Payer: Self-pay | Source: Ambulatory Visit | Attending: Vascular Surgery

## 2015-04-24 DIAGNOSIS — Z79899 Other long term (current) drug therapy: Secondary | ICD-10-CM | POA: Insufficient documentation

## 2015-04-24 DIAGNOSIS — C8590 Non-Hodgkin lymphoma, unspecified, unspecified site: Secondary | ICD-10-CM | POA: Diagnosis present

## 2015-04-24 DIAGNOSIS — R569 Unspecified convulsions: Secondary | ICD-10-CM | POA: Diagnosis not present

## 2015-04-24 DIAGNOSIS — E780 Pure hypercholesterolemia: Secondary | ICD-10-CM | POA: Diagnosis not present

## 2015-04-24 DIAGNOSIS — I10 Essential (primary) hypertension: Secondary | ICD-10-CM | POA: Insufficient documentation

## 2015-04-24 DIAGNOSIS — Z87891 Personal history of nicotine dependence: Secondary | ICD-10-CM | POA: Insufficient documentation

## 2015-04-24 DIAGNOSIS — Z8673 Personal history of transient ischemic attack (TIA), and cerebral infarction without residual deficits: Secondary | ICD-10-CM | POA: Insufficient documentation

## 2015-04-24 HISTORY — PX: PERIPHERAL VASCULAR CATHETERIZATION: SHX172C

## 2015-04-24 SURGERY — PORTA CATH INSERTION
Anesthesia: Moderate Sedation

## 2015-04-24 MED ORDER — LIDOCAINE-EPINEPHRINE (PF) 1 %-1:200000 IJ SOLN
INTRAMUSCULAR | Status: AC
  Filled 2015-04-24: qty 30

## 2015-04-24 MED ORDER — HEPARIN SODIUM (PORCINE) 1000 UNIT/ML IJ SOLN
INTRAMUSCULAR | Status: AC
  Filled 2015-04-24: qty 1

## 2015-04-24 MED ORDER — MIDAZOLAM HCL 2 MG/2ML IJ SOLN
INTRAMUSCULAR | Status: DC | PRN
  Administered 2015-04-24: 2 mg via INTRAVENOUS
  Administered 2015-04-24 (×2): 1 mg via INTRAVENOUS

## 2015-04-24 MED ORDER — ONDANSETRON HCL 4 MG/2ML IJ SOLN: 4.0000 mg | INTRAMUSCULAR | Status: DC | PRN

## 2015-04-24 MED ORDER — SODIUM CHLORIDE 0.9 % IR SOLN
Freq: Once | Status: DC
  Filled 2015-04-24: qty 2

## 2015-04-24 MED ORDER — CEFAZOLIN SODIUM 1-5 GM-% IV SOLN
INTRAVENOUS | Status: AC
  Filled 2015-04-24: qty 50

## 2015-04-24 MED ORDER — HYDROMORPHONE HCL 1 MG/ML IJ SOLN: 1.0000 mg | Freq: Once | INTRAMUSCULAR | Status: DC | PRN

## 2015-04-24 MED ORDER — CEFAZOLIN SODIUM 1-5 GM-% IV SOLN
1.0000 g | Freq: Once | INTRAVENOUS | Status: AC
  Administered 2015-04-24: 1 g via INTRAVENOUS

## 2015-04-24 MED ORDER — SODIUM CHLORIDE 0.9 % IV SOLN
INTRAVENOUS | Status: DC
  Administered 2015-04-24: 08:00:00 via INTRAVENOUS

## 2015-04-24 MED ORDER — MIDAZOLAM HCL 5 MG/5ML IJ SOLN
INTRAMUSCULAR | Status: AC
  Filled 2015-04-24: qty 5

## 2015-04-24 MED ORDER — FENTANYL CITRATE (PF) 100 MCG/2ML IJ SOLN
INTRAMUSCULAR | Status: AC
  Filled 2015-04-24: qty 2

## 2015-04-24 MED ORDER — HEPARIN (PORCINE) IN NACL 2-0.9 UNIT/ML-% IJ SOLN
INTRAMUSCULAR | Status: AC
  Filled 2015-04-24: qty 500

## 2015-04-24 MED ORDER — FENTANYL CITRATE (PF) 100 MCG/2ML IJ SOLN
INTRAMUSCULAR | Status: DC | PRN
  Administered 2015-04-24 (×2): 25 ug via INTRAVENOUS
  Administered 2015-04-24: 50 ug via INTRAVENOUS

## 2015-04-24 SURGICAL SUPPLY — 3 items
PACK ANGIOGRAPHY (CUSTOM PROCEDURE TRAY) ×3 IMPLANT
PORTACATH POWER 8F (Port) ×3 IMPLANT
TOWEL OR 17X26 4PK STRL BLUE (TOWEL DISPOSABLE) ×3 IMPLANT

## 2015-04-24 NOTE — H&P (Signed)
[facility] History & Physical Update  The patient was interviewed and re-examined.  The patient's previous History and Physical has been reviewed and is unchanged.  There is no change in the plan of care. We plan to proceed with the scheduled procedure.  Erron Wengert, MD  04/24/2015, 8:05 AM

## 2015-04-24 NOTE — CV Procedure (Signed)
      [facility]       Operative Note  Date: 04/24/2015  Preoperative diagnosis:  1. lymphoma  Postoperative diagnosis:  Same as above  Procedures: #1. Ultrasound guidance for vascular access to the right internal jugular vein. #2. Fluoroscopic guidance for placement of catheter. #3. Placement of CT compatible Port-A-Cath, right internal jugular vein.  Surgeon: Leotis Pain, MD.   Anesthesia: Local with moderate conscious sedation.  Fluoroscopy time: less than 1 minute  Contrast used: 0  Estimated blood loss: 25 cc  Indication for the procedure:  Patient is a 74 yo Hispanic male with lymphoma.  The patient needs a Port-A-Cath for durable venous access, chemotherapy, lab draws, and CT scans. We are asked to place this. Risks and benefits were discussed and informed consent was obtained.  Description of procedure: The patient was brought to the vascular and interventional radiology suite. The right neck chest and shoulder were sterilely prepped and draped, and a sterile surgical field was created. Ultrasound was used to help visualize a patent right internal jugular vein. This was then accessed under direct ultrasound guidance without difficulty with the Seldinger needle and a permanent image was recorded. A J-wire was placed. After skin nick and dilatation, the peel-away sheath was then placed over the wire. I then anesthetized an area under the clavicle approximately 1-2 fingerbreadths. A transverse incision was created and an inferior pocket was created with electrocautery and blunt dissection. The port was then brought onto the field, placed into the pocket and secured to the chest wall with 2 Prolene sutures. The catheter was connected to the port and tunneled from the subclavicular incision to the access site. Fluoroscopic guidance was then used to cut the catheter to an appropriate length. The catheter was then placed through the peel-away sheath and the peel-away  sheath was removed. The catheter tip was parked in excellent location under fluorocoscopic guidance in the cavoatrial junction. The pocket was then irrigated with antibiotic impregnated saline and the wound was closed with a running 3-0 Vicryl and a 4-0 Monocryl. The access incision was closed with a single 4-0 Monocryl. The Huber needle was used to withdraw blood and flush the port with heparinized saline. Dermabond was then placed as a dressing. The patient tolerated the procedure well and was taken to the recovery room in stable condition.   Cedar Roseman 04/24/2015 8:53 AM

## 2015-04-25 ENCOUNTER — Encounter: Payer: Self-pay | Admitting: Vascular Surgery

## 2015-04-26 ENCOUNTER — Emergency Department: Payer: Medicare Other

## 2015-04-26 ENCOUNTER — Encounter: Payer: Self-pay | Admitting: *Deleted

## 2015-04-26 ENCOUNTER — Other Ambulatory Visit: Payer: Self-pay

## 2015-04-26 ENCOUNTER — Ambulatory Visit
Admission: RE | Admit: 2015-04-26 | Discharge: 2015-04-26 | Disposition: A | Discharge status: Home / Self Care | Payer: Medicare Other | Source: Ambulatory Visit | Attending: Oncology | Admitting: Oncology

## 2015-04-26 ENCOUNTER — Emergency Department
Admission: EM | Admit: 2015-04-26 | Discharge: 2015-04-26 | Disposition: A | Discharge status: Home / Self Care | Payer: Medicare Other | Attending: Emergency Medicine | Admitting: Emergency Medicine

## 2015-04-26 DIAGNOSIS — Z87891 Personal history of nicotine dependence: Secondary | ICD-10-CM | POA: Diagnosis not present

## 2015-04-26 DIAGNOSIS — R55 Syncope and collapse: Secondary | ICD-10-CM | POA: Diagnosis present

## 2015-04-26 DIAGNOSIS — I10 Essential (primary) hypertension: Secondary | ICD-10-CM | POA: Insufficient documentation

## 2015-04-26 DIAGNOSIS — R61 Generalized hyperhidrosis: Secondary | ICD-10-CM | POA: Insufficient documentation

## 2015-04-26 DIAGNOSIS — C8448 Peripheral T-cell lymphoma, not classified, lymph nodes of multiple sites: Secondary | ICD-10-CM

## 2015-04-26 LAB — COMPREHENSIVE METABOLIC PANEL
ALT: 54 U/L (ref 17–63)
AST: 17 U/L (ref 15–41)
Albumin: 3.8 g/dL (ref 3.5–5.0)
Alkaline Phosphatase: 253 U/L — ABNORMAL HIGH (ref 38–126)
Anion gap: 11 (ref 5–15)
BILIRUBIN TOTAL: 0.7 mg/dL (ref 0.3–1.2)
BUN: 13 mg/dL (ref 6–20)
CHLORIDE: 94 mmol/L — AB (ref 101–111)
CO2: 28 mmol/L (ref 22–32)
Calcium: 8.8 mg/dL — ABNORMAL LOW (ref 8.9–10.3)
Creatinine, Ser: 0.96 mg/dL (ref 0.61–1.24)
GFR calc Af Amer: >60 mL/min (ref >60)
Glucose, Bld: 139 mg/dL — ABNORMAL HIGH (ref 65–99)
Potassium: 4.1 mmol/L (ref 3.5–5.1)
Sodium: 133 mmol/L — ABNORMAL LOW (ref 135–145)
Total Protein: 6.8 g/dL (ref 6.5–8.1)

## 2015-04-26 LAB — CBC
HEMATOCRIT: 41.4 % (ref 40.0–52.0)
Hemoglobin: 13.5 g/dL (ref 13.0–18.0)
MCH: 29.3 pg (ref 26.0–34.0)
MCHC: 32.6 g/dL (ref 32.0–36.0)
MCV: 89.8 fL (ref 80.0–100.0)
Platelets: 418 10*3/uL (ref 150–440)
RBC: 4.6 MIL/uL (ref 4.40–5.90)
RDW: 14.4 % (ref 11.5–14.5)
WBC: 13.1 10*3/uL — AB (ref 3.8–10.6)

## 2015-04-26 LAB — GLUCOSE, CAPILLARY: GLUCOSE-CAPILLARY: 132 mg/dL — AB (ref 65–99)

## 2015-04-26 LAB — PROTIME-INR
INR: 0.98
PROTHROMBIN TIME: 13.2 s (ref 11.4–15.0)

## 2015-04-26 LAB — TROPONIN I: Troponin I: <0.03 ng/mL (ref <0.031)

## 2015-04-26 NOTE — Discharge Instructions (Signed)
Sncope (Syncope) Sncope es un trmino mdico que significa desmayarse o perder la conciencia. Es cuando se pierde la conciencia y cae al piso. Generalmente, la persona permanece inconsciente durante menos de 42minutos. Puede tener algunas contracciones musculares durante un mximo de 15segundos antes de despertar y volver a la normalidad. El sncope se presenta con mayor frecuencia en los adultos [place], [place] puede ocurrir a Hotel manager. Aunque la [employer] de las causas no implican un peligro, el sncope puede ser un signo de un problema mdico grave. Es importante buscar atencin mdica.  CAUSAS  La causa es una disminucin sbita del flujo de sangre al cerebro. Generalmente la causa especfica no puede determinarse. Los factores que pueden provocar el sncope son:  El uso de medicamentos que disminuyen la presin arterial.  Los cambios sbitos de [place], como por ejemplo al ponerse de pie rpidamente.  Tomar ms dosis de medicamento que lo recetado.  Permanecer de pie en un lugar por [employer].  Sufrir convulsiones.  Deshidratacin y exposicin excesiva al calor.  Bajo nivel de glucosa en sangre (hipoglucemia).  Dificultad para defecar.  Enfermedades cardacas, latidos cardacos irregulares u otros problemas circulatorios.  [place], estrs emocional, visin de Armed forces technical officer intenso. SNTOMAS  Inmediatamente antes de desmayarse podr:  Sentirse mareado o aturdido.  Sentir nuseas.  Ver todo blanco o negro en el campo de la visin.  Tener la piel fra y hmeda. DIAGNSTICO  El mdico le preguntar acerca de sus sntomas, le realizar un examen fsico y un electrocardiograma (ECG) para registrar la actividad elctrica del corazn. Tambin podr indicarle otras pruebas cardacas o anlisis de sangre para determinar las causas del sncope, por ejemplo:  Ecocardiograma transtorcico (ETT). Durante IT trainer, se usan ondas sonoras para evaluar el flujo de la sangre  a travs del corazn.  Ecocardiograma transesofgico (ETE).  Monitoreo cardaco. Permite que el mdico controle la frecuencia y el ritmo cardaco en tiempo real.  Monitor Holter. Es un dispositivo porttil que [employer] latidos cardacos y [place] a Retail buyer las arritmias cardacas. Le permite al [employer] registrar la actividad [place], si es necesario.  Pruebas de estrs por ejercicio o por medicamentos que aceleran los latidos cardacos. TRATAMIENTO  En la [employer], no se necesita tratamiento. Segn la causa del sncope, el mdico podr recomendarle que cambie o deje de tomar algunos de sus medicamentos. INSTRUCCIONES PARA EL CUIDADO EN EL HOGAR  Pdale a alguien que se quede con usted hasta que se sienta estable.  No conduzca vehculos, no use maquinarias ni practique deportes hasta que el mdico lo autorice.  [place]umpla con todas las visitas de control, segn le indique su mdico.  Recustese inmediatamente si siente que va a desmayarse. Respire profundamente y de [place] continua. Espere hasta que los sntomas hayan desaparecido.  Beba suficiente lquido para Consulting civil engineer orina clara o de color amarillo plido.  Si est tomando medicamentos para la presin arterial o para el corazn, pngase de pie lentamente, tmese algunos minutos para permanecer sentado y luego prese. Esto reduce los mareos. SOLICITE ATENCIN MDICA DE INMEDIATO SI:   Sufre un dolor intenso de [place].  Siente un dolor intenso inusual en el pecho, el abdomen, o la espalda.  Tiene un sangrado por la boca o el recto, o la materia fecal es de color negro o aspecto alquitranado.  Siente latidos irregulares o muy rpidos.  Siente dolor al respirar.  Sufre episodios de [employer] repetidos o temblores como sacudidas durante un episodio.  Se desmaya mientras se encuentra sentado o acostado.  Se siente confundido.  Presenta dificultad para caminar.  Siente debilidad  intensa.  Tiene problemas de visin. Si se desmaya, llame al servicio de emergencias de su localidad (911 en los [place]). No conduzca por sus propios medios [employer] hospital.  ASEGRESE DE QUE:  Comprende estas instrucciones.  Controlar su afeccin.  Recibir ayuda de inmediato si no mejora o si empeora. Document Released: 06/12/2005 Document Revised: 09/07/2013 [facility] Patient Information 2015 [place]. This information is not intended to replace advice given to you by your health care provider. Make sure you discuss any questions you have with your health care provider.

## 2015-04-26 NOTE — ED Notes (Addendum)
Pt daughter reports that pt had similar episode yesterday at the grocery store where daughter reports pt became diaphoretic and dizzy. Pt sat down and symptoms passed. Pts daughter reports pt had symptoms a few weeks ago of upper abd pain and SOB but pt has not had symptoms similar to that in the past week. MD notified. Daughter reports Chemo is supposed to begin on Wednesday andpt is scheduled for PET scan this Friday.

## 2015-04-26 NOTE — ED Provider Notes (Signed)
[facility] Emergency Department Provider Note     Time seen: ----------------------------------------- 10:53 AM on 04/26/2015 -----------------------------------------    I have reviewed the triage vital signs and the nursing notes.   HISTORY  Chief Complaint Loss of Consciousness    HPI Erik Shelton is a 74 y.o. male who presents ER after having a nuclear medicine test and went unresponsive. According to report his eyes rolled like his head and he was diaphoretic. Upon arrival to the ER he is still somewhat diaphoretic but he is awake and oriented. States he has no pain, doesn't history of lymphoma and is to start chemotherapy on Friday. Loss of consciousness was brief. Reports she has not been ill recently, has not had this happen to him before   Past Medical History  Diagnosis Date  . Stroke   . Seizures   . Hypercholesteremia   . HTN (hypertension)   . Cancer     T-Cell Lymphoma  . Seizures     Patient Active Problem List   Diagnosis Date Noted  . Intractable pain 04/14/2015  . 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 Peripheral vascular catheterization N/A 04/24/2015    Procedure: Glori Luis Cath Insertion;  Surgeon: Algernon Huxley, MD;  Location: [facility];  Service: Cardiovascular;  Laterality: N/A;    Allergies No known allergies  Social History Social History  Substance Use Topics  . Smoking status: Former Research scientist (life sciences)  . Smokeless tobacco: None     Comment: Quit  in 1990  . Alcohol Use: Yes     Comment: Occassional glass of wine    Review of Systems Constitutional: Negative for fever. Eyes: Negative for visual changes. ENT: Negative for sore throat. Cardiovascular: Negative for chest pain. Respiratory: Negative for shortness of breath. Gastrointestinal: Negative for  abdominal pain, vomiting and diarrhea. Genitourinary: Negative for dysuria. Musculoskeletal: Negative for back pain. Skin: Positive for diaphoresis Neurological: Negative for headaches, focal weakness or numbness.  10-point ROS otherwise negative.  ____________________________________________   PHYSICAL EXAM:  VITAL SIGNS: ED Triage Vitals  Enc Vitals Group     BP 04/26/15 1048 134/84 mmHg     Pulse Rate 04/26/15 1048 71     Resp --      Temp 04/26/15 1048 98.2 F (36.8 C)     Temp Source 04/26/15 1048 Oral     SpO2 04/26/15 1048 98 %     Weight 04/26/15 1048 205 lb (92.987 kg)     Height 04/26/15 1048 5\' 11"  (1.803 m)     Head Cir --      Peak Flow --      Pain Score --      Pain Loc --      Pain Edu? --      Excl. in [place]? --     Constitutional: Alert and oriented. Well appearing and in no distress. Eyes: Conjunctivae are normal. PERRL. Normal extraocular movements. ENT   Head: Normocephalic and atraumatic.   Nose: No congestion/rhinnorhea.   Mouth/Throat: Mucous membranes are moist.   Neck: No stridor. Cardiovascular: Normal rate, regular rhythm. Normal and symmetric distal pulses are present in all extremities. No murmurs, rubs, or gallops. Right chest wall with recent Port-A-Cath placement. No signs of infection Respiratory: Normal respiratory effort without tachypnea nor retractions. Breath sounds are clear and equal  bilaterally. No wheezes/rales/rhonchi. Gastrointestinal: Soft and nontender. No distention. No abdominal bruits.  Musculoskeletal: Nontender with normal range of motion in all extremities. No joint effusions.  No lower extremity tenderness nor edema. Neurologic:  Normal speech and language. No gross focal neurologic deficits are appreciated. Speech is normal. No gait instability. Skin:  Mild diaphoresis. Psychiatric: Mood and affect are normal. Speech and behavior are normal. Patient exhibits appropriate insight and  judgment. ____________________________________________  EKG: Interpreted by me. Normal sinus rhythm with normal axis normal intervals. No evidence of hypertrophy or acute infarction. Rate 76 bpm  ____________________________________________  ED COURSE:  Pertinent labs & imaging results that were available during my care of the patient were reviewed by me and considered in my medical decision making (see chart for details). Syncope of unclear etiology. Patient with diffuse lymphoma. We'll need cardiac workup ____________________________________________    LABS (pertinent positives/negatives)  Labs Reviewed  CBC - Abnormal; Notable for the following:    WBC 13.1 (*)    All other components within normal limits  COMPREHENSIVE METABOLIC PANEL - Abnormal; Notable for the following:    Sodium 133 (*)    Chloride 94 (*)    Glucose, Bld 139 (*)    Calcium 8.8 (*)    Alkaline Phosphatase 253 (*)    All other components within normal limits  GLUCOSE, CAPILLARY - Abnormal; Notable for the following:    Glucose-Capillary 132 (*)    All other components within normal limits  TROPONIN I  PROTIME-INR    RADIOLOGY Images were viewed by me  Chest x-ray  IMPRESSION: Mild adenopathy.  Mild bibasilar atelectasis. ____________________________________________  FINAL ASSESSMENT AND PLAN  Syncope, lymphoma  Plan: Patient with labs and imaging as dictated above. Patient is in no acute distress, denies any complaints now and is ambulatory. He does not want stay in the hospital, advised I cannot explain why he syncopized. Possible vagal event, again he does not want to stay. We'll continue close outpatient follow-up   Earleen Newport, MD   Earleen Newport, MD 04/26/15 [phone]

## 2015-04-26 NOTE — ED Notes (Addendum)
Pt arrives form nuc med, pt was in nuc med for muga test and staff states he went unresponsive and eyes rolled in the back of his head, upon arrival to ER pt awake and alert, pt diapohoretic, pt states no pain, pt has lymphoma ca and is to start chemo on Friday

## 2015-04-28 DIAGNOSIS — R590 Localized enlarged lymph nodes: Secondary | ICD-10-CM | POA: Diagnosis not present

## 2015-04-28 DIAGNOSIS — C8448 Peripheral T-cell lymphoma, not classified, lymph nodes of multiple sites: Secondary | ICD-10-CM | POA: Diagnosis present

## 2015-04-28 LAB — GLUCOSE, CAPILLARY: GLUCOSE-CAPILLARY: 128 mg/dL — AB (ref 65–99)

## 2015-04-28 MED ORDER — FLUDEOXYGLUCOSE F - 18 (FDG) INJECTION
13.4000 | Freq: Once | INTRAVENOUS | Status: DC | PRN
  Administered 2015-04-28: 13.4 via INTRAVENOUS
  Filled 2015-04-28: qty 13.4

## 2015-05-01 ENCOUNTER — Ambulatory Visit
Admission: RE | Admit: 2015-05-01 | Discharge: 2015-05-01 | Disposition: A | Discharge status: Home / Self Care | Payer: Medicare Other | Source: Ambulatory Visit | Attending: Oncology | Admitting: Oncology

## 2015-05-01 ENCOUNTER — Inpatient Hospital Stay: Payer: Medicare Other | Attending: Oncology

## 2015-05-01 DIAGNOSIS — I2510 Atherosclerotic heart disease of native coronary artery without angina pectoris: Secondary | ICD-10-CM | POA: Insufficient documentation

## 2015-05-01 DIAGNOSIS — Z7982 Long term (current) use of aspirin: Secondary | ICD-10-CM | POA: Insufficient documentation

## 2015-05-01 DIAGNOSIS — Z5111 Encounter for antineoplastic chemotherapy: Secondary | ICD-10-CM | POA: Insufficient documentation

## 2015-05-01 DIAGNOSIS — D701 Agranulocytosis secondary to cancer chemotherapy: Secondary | ICD-10-CM | POA: Insufficient documentation

## 2015-05-01 DIAGNOSIS — R5381 Other malaise: Secondary | ICD-10-CM | POA: Insufficient documentation

## 2015-05-01 DIAGNOSIS — K5730 Diverticulosis of large intestine without perforation or abscess without bleeding: Secondary | ICD-10-CM | POA: Insufficient documentation

## 2015-05-01 DIAGNOSIS — C8448 Peripheral T-cell lymphoma, not classified, lymph nodes of multiple sites: Secondary | ICD-10-CM | POA: Insufficient documentation

## 2015-05-01 DIAGNOSIS — Z87891 Personal history of nicotine dependence: Secondary | ICD-10-CM | POA: Insufficient documentation

## 2015-05-01 DIAGNOSIS — T451X5S Adverse effect of antineoplastic and immunosuppressive drugs, sequela: Secondary | ICD-10-CM | POA: Insufficient documentation

## 2015-05-01 DIAGNOSIS — R109 Unspecified abdominal pain: Secondary | ICD-10-CM | POA: Insufficient documentation

## 2015-05-01 DIAGNOSIS — Z7952 Long term (current) use of systemic steroids: Secondary | ICD-10-CM | POA: Insufficient documentation

## 2015-05-01 DIAGNOSIS — R531 Weakness: Secondary | ICD-10-CM | POA: Insufficient documentation

## 2015-05-01 DIAGNOSIS — R5383 Other fatigue: Secondary | ICD-10-CM | POA: Insufficient documentation

## 2015-05-01 DIAGNOSIS — Z418 Encounter for other procedures for purposes other than remedying health state: Secondary | ICD-10-CM | POA: Insufficient documentation

## 2015-05-01 DIAGNOSIS — E780 Pure hypercholesterolemia: Secondary | ICD-10-CM | POA: Insufficient documentation

## 2015-05-01 DIAGNOSIS — I10 Essential (primary) hypertension: Secondary | ICD-10-CM | POA: Insufficient documentation

## 2015-05-01 DIAGNOSIS — R110 Nausea: Secondary | ICD-10-CM | POA: Insufficient documentation

## 2015-05-01 DIAGNOSIS — Z79899 Other long term (current) drug therapy: Secondary | ICD-10-CM | POA: Insufficient documentation

## 2015-05-01 DIAGNOSIS — R630 Anorexia: Secondary | ICD-10-CM | POA: Insufficient documentation

## 2015-05-01 MED ORDER — TECHNETIUM TC 99M-LABELED RED BLOOD CELLS IV KIT
20.0000 | PACK | Freq: Once | INTRAVENOUS | Status: AC | PRN
  Administered 2015-05-01: 22.06 via INTRAVENOUS

## 2015-05-02 ENCOUNTER — Other Ambulatory Visit: Payer: Self-pay | Admitting: Oncology

## 2015-05-03 ENCOUNTER — Inpatient Hospital Stay: Payer: Medicare Other

## 2015-05-03 ENCOUNTER — Inpatient Hospital Stay (HOSPITAL_BASED_OUTPATIENT_CLINIC_OR_DEPARTMENT_OTHER): Payer: Medicare Other | Admitting: Oncology

## 2015-05-03 VITALS — BP 120/79 | HR 102 | Temp 98.1°F | Resp 18 | Ht 68.9 in | Wt 195.8 lb

## 2015-05-03 DIAGNOSIS — Z87891 Personal history of nicotine dependence: Secondary | ICD-10-CM | POA: Diagnosis not present

## 2015-05-03 DIAGNOSIS — E780 Pure hypercholesterolemia: Secondary | ICD-10-CM

## 2015-05-03 DIAGNOSIS — D701 Agranulocytosis secondary to cancer chemotherapy: Secondary | ICD-10-CM | POA: Diagnosis not present

## 2015-05-03 DIAGNOSIS — T451X5S Adverse effect of antineoplastic and immunosuppressive drugs, sequela: Secondary | ICD-10-CM | POA: Diagnosis not present

## 2015-05-03 DIAGNOSIS — Z5111 Encounter for antineoplastic chemotherapy: Secondary | ICD-10-CM | POA: Diagnosis not present

## 2015-05-03 DIAGNOSIS — C8448 Peripheral T-cell lymphoma, not classified, lymph nodes of multiple sites: Secondary | ICD-10-CM | POA: Diagnosis not present

## 2015-05-03 DIAGNOSIS — Z79899 Other long term (current) drug therapy: Secondary | ICD-10-CM

## 2015-05-03 DIAGNOSIS — R5381 Other malaise: Secondary | ICD-10-CM

## 2015-05-03 DIAGNOSIS — Z418 Encounter for other procedures for purposes other than remedying health state: Secondary | ICD-10-CM | POA: Diagnosis not present

## 2015-05-03 DIAGNOSIS — R531 Weakness: Secondary | ICD-10-CM

## 2015-05-03 DIAGNOSIS — R110 Nausea: Secondary | ICD-10-CM | POA: Diagnosis not present

## 2015-05-03 DIAGNOSIS — R5383 Other fatigue: Secondary | ICD-10-CM | POA: Diagnosis not present

## 2015-05-03 DIAGNOSIS — K5730 Diverticulosis of large intestine without perforation or abscess without bleeding: Secondary | ICD-10-CM | POA: Diagnosis not present

## 2015-05-03 DIAGNOSIS — Z7952 Long term (current) use of systemic steroids: Secondary | ICD-10-CM | POA: Diagnosis not present

## 2015-05-03 DIAGNOSIS — I2510 Atherosclerotic heart disease of native coronary artery without angina pectoris: Secondary | ICD-10-CM

## 2015-05-03 DIAGNOSIS — C84A8 Cutaneous T-cell lymphoma, unspecified, lymph nodes of multiple sites: Secondary | ICD-10-CM

## 2015-05-03 DIAGNOSIS — R109 Unspecified abdominal pain: Secondary | ICD-10-CM | POA: Diagnosis not present

## 2015-05-03 DIAGNOSIS — I10 Essential (primary) hypertension: Secondary | ICD-10-CM

## 2015-05-03 DIAGNOSIS — C801 Malignant (primary) neoplasm, unspecified: Secondary | ICD-10-CM

## 2015-05-03 DIAGNOSIS — Z7982 Long term (current) use of aspirin: Secondary | ICD-10-CM | POA: Diagnosis not present

## 2015-05-03 DIAGNOSIS — R630 Anorexia: Secondary | ICD-10-CM | POA: Diagnosis not present

## 2015-05-03 DIAGNOSIS — C8442 Peripheral T-cell lymphoma, not classified, intrathoracic lymph nodes: Secondary | ICD-10-CM

## 2015-05-03 LAB — COMPREHENSIVE METABOLIC PANEL
ALT: 29 U/L (ref 17–63)
AST: 15 U/L (ref 15–41)
Albumin: 3.9 g/dL (ref 3.5–5.0)
Alkaline Phosphatase: 193 U/L — ABNORMAL HIGH (ref 38–126)
Anion gap: 6 (ref 5–15)
BILIRUBIN TOTAL: 0.8 mg/dL (ref 0.3–1.2)
BUN: 16 mg/dL (ref 6–20)
CALCIUM: 8.4 mg/dL — AB (ref 8.9–10.3)
CHLORIDE: 102 mmol/L (ref 101–111)
CO2: 25 mmol/L (ref 22–32)
Creatinine, Ser: 0.92 mg/dL (ref 0.61–1.24)
Glucose, Bld: 139 mg/dL — ABNORMAL HIGH (ref 65–99)
Potassium: 4.4 mmol/L (ref 3.5–5.1)
Sodium: 133 mmol/L — ABNORMAL LOW (ref 135–145)
TOTAL PROTEIN: 6.9 g/dL (ref 6.5–8.1)

## 2015-05-03 LAB — CBC WITH DIFFERENTIAL/PLATELET
BASOS ABS: 0.1 10*3/uL (ref 0–0.1)
BASOS PCT: 1 %
EOS ABS: 1 10*3/uL — AB (ref 0–0.7)
EOS PCT: 10 %
HCT: 41.4 % (ref 40.0–52.0)
HEMOGLOBIN: 13.8 g/dL (ref 13.0–18.0)
LYMPHS ABS: 1.5 10*3/uL (ref 1.0–3.6)
Lymphocytes Relative: 14 %
MCH: 29.8 pg (ref 26.0–34.0)
MCHC: 33.5 g/dL (ref 32.0–36.0)
MCV: 88.9 fL (ref 80.0–100.0)
Monocytes Absolute: 0.8 10*3/uL (ref 0.2–1.0)
Monocytes Relative: 8 %
NEUTROS PCT: 67 %
Neutro Abs: 7 10*3/uL — ABNORMAL HIGH (ref 1.4–6.5)
PLATELETS: 234 10*3/uL (ref 150–440)
RBC: 4.65 MIL/uL (ref 4.40–5.90)
RDW: 14.3 % (ref 11.5–14.5)
WBC: 10.4 10*3/uL (ref 3.8–10.6)

## 2015-05-03 LAB — LACTATE DEHYDROGENASE: LDH: 327 U/L — AB (ref 98–192)

## 2015-05-03 MED ORDER — HEPARIN SOD (PORK) LOCK FLUSH 100 UNIT/ML IV SOLN
500.0000 [IU] | Freq: Once | INTRAVENOUS | Status: AC
  Administered 2015-05-03: 500 [IU] via INTRAVENOUS
  Filled 2015-05-03: qty 5

## 2015-05-03 MED ORDER — DEXAMETHASONE SODIUM PHOSPHATE 100 MG/10ML IJ SOLN
Freq: Once | INTRAMUSCULAR | Status: AC
  Administered 2015-05-03: 12:00:00 via INTRAVENOUS
  Filled 2015-05-03: qty 8

## 2015-05-03 MED ORDER — LIDOCAINE-PRILOCAINE 2.5-2.5 % EX CREA: 1.0000 "application " | TOPICAL_CREAM | CUTANEOUS | Status: AC | PRN

## 2015-05-03 MED ORDER — SODIUM CHLORIDE 0.9 % IV SOLN
80.0000 mg/m2 | Freq: Once | INTRAVENOUS | Status: AC
  Administered 2015-05-03: 170 mg via INTRAVENOUS
  Filled 2015-05-03: qty 8.5

## 2015-05-03 MED ORDER — SODIUM CHLORIDE 0.9 % IJ SOLN
10.0000 mL | INTRAMUSCULAR | Status: DC | PRN
  Administered 2015-05-03: 10 mL via INTRAVENOUS
  Filled 2015-05-03: qty 10

## 2015-05-03 MED ORDER — SODIUM CHLORIDE 0.9 % IV SOLN
Freq: Once | INTRAVENOUS | Status: AC
  Administered 2015-05-03: 11:00:00 via INTRAVENOUS
  Filled 2015-05-03: qty 1000

## 2015-05-03 MED ORDER — SODIUM CHLORIDE 0.9 % IV SOLN
750.0000 mg/m2 | Freq: Once | INTRAVENOUS | Status: AC
  Administered 2015-05-03: 1620 mg via INTRAVENOUS
  Filled 2015-05-03: qty 81

## 2015-05-03 MED ORDER — DOXORUBICIN HCL CHEMO IV INJECTION 2 MG/ML
50.0000 mg/m2 | Freq: Once | INTRAVENOUS | Status: AC
  Administered 2015-05-03: 108 mg via INTRAVENOUS
  Filled 2015-05-03: qty 54

## 2015-05-03 MED ORDER — VINCRISTINE SULFATE CHEMO INJECTION 1 MG/ML
2.0000 mg | Freq: Once | INTRAVENOUS | Status: AC
  Administered 2015-05-03: 2 mg via INTRAVENOUS
  Filled 2015-05-03: qty 2

## 2015-05-04 ENCOUNTER — Telehealth: Payer: Self-pay | Admitting: *Deleted

## 2015-05-04 ENCOUNTER — Inpatient Hospital Stay: Payer: Medicare Other

## 2015-05-04 VITALS — BP 126/77 | HR 102 | Temp 97.0°F | Resp 18

## 2015-05-04 DIAGNOSIS — Z5111 Encounter for antineoplastic chemotherapy: Secondary | ICD-10-CM | POA: Diagnosis not present

## 2015-05-04 DIAGNOSIS — C8448 Peripheral T-cell lymphoma, not classified, lymph nodes of multiple sites: Secondary | ICD-10-CM

## 2015-05-04 MED ORDER — OXYCODONE-ACETAMINOPHEN 5-325 MG PO TABS: 1.0000 | ORAL_TABLET | ORAL | Status: DC | PRN

## 2015-05-04 MED ORDER — HEPARIN SOD (PORK) LOCK FLUSH 100 UNIT/ML IV SOLN
500.0000 [IU] | Freq: Once | INTRAVENOUS | Status: AC
  Administered 2015-05-04: 500 [IU] via INTRAVENOUS

## 2015-05-04 MED ORDER — SODIUM CHLORIDE 0.9 % IV SOLN
Freq: Once | INTRAVENOUS | Status: AC
  Administered 2015-05-04: 14:00:00 via INTRAVENOUS
  Filled 2015-05-04: qty 4

## 2015-05-04 MED ORDER — SODIUM CHLORIDE 0.9 % IV SOLN
80.0000 mg/m2 | Freq: Once | INTRAVENOUS | Status: AC
  Administered 2015-05-04: 170 mg via INTRAVENOUS
  Filled 2015-05-04: qty 8.5

## 2015-05-04 MED ORDER — HEPARIN SOD (PORK) LOCK FLUSH 100 UNIT/ML IV SOLN
INTRAVENOUS | Status: AC
  Filled 2015-05-04: qty 5

## 2015-05-04 MED ORDER — PREDNISONE 20 MG PO TABS: ORAL_TABLET | ORAL | Status: DC

## 2015-05-04 NOTE — Telephone Encounter (Signed)
Informed that prescription is ready to pick up  

## 2015-05-05 ENCOUNTER — Inpatient Hospital Stay: Payer: Medicare Other

## 2015-05-05 VITALS — BP 124/75 | HR 103 | Temp 96.3°F | Resp 18

## 2015-05-05 DIAGNOSIS — C8448 Peripheral T-cell lymphoma, not classified, lymph nodes of multiple sites: Secondary | ICD-10-CM

## 2015-05-05 DIAGNOSIS — Z5111 Encounter for antineoplastic chemotherapy: Secondary | ICD-10-CM | POA: Diagnosis not present

## 2015-05-05 LAB — COMP PANEL: LEUKEMIA/LYMPHOMA

## 2015-05-05 MED ORDER — SODIUM CHLORIDE 0.9 % IJ SOLN
10.0000 mL | INTRAMUSCULAR | Status: DC | PRN
  Administered 2015-05-05: 10 mL via INTRAVENOUS
  Filled 2015-05-05: qty 10

## 2015-05-05 MED ORDER — HEPARIN SOD (PORK) LOCK FLUSH 100 UNIT/ML IV SOLN
500.0000 [IU] | Freq: Once | INTRAVENOUS | Status: AC
  Administered 2015-05-05: 500 [IU] via INTRAVENOUS
  Filled 2015-05-05: qty 5

## 2015-05-05 MED ORDER — SODIUM CHLORIDE 0.9 % IV SOLN
80.0000 mg/m2 | Freq: Once | INTRAVENOUS | Status: AC
  Administered 2015-05-05: 170 mg via INTRAVENOUS
  Filled 2015-05-05: qty 8.5

## 2015-05-05 MED ORDER — SODIUM CHLORIDE 0.9 % IV SOLN
Freq: Once | INTRAVENOUS | Status: AC
  Administered 2015-05-05: 14:00:00 via INTRAVENOUS
  Filled 2015-05-05: qty 4

## 2015-05-05 MED ORDER — SODIUM CHLORIDE 0.9 % IV SOLN
INTRAVENOUS | Status: DC
  Administered 2015-05-05: 14:00:00 via INTRAVENOUS
  Filled 2015-05-05: qty 1000

## 2015-05-05 MED ORDER — PEGFILGRASTIM 6 MG/0.6ML ~~LOC~~ PSKT
6.0000 mg | PREFILLED_SYRINGE | Freq: Once | SUBCUTANEOUS | Status: AC
  Administered 2015-05-05: 6 mg via SUBCUTANEOUS
  Filled 2015-05-05: qty 0.6

## 2015-05-05 NOTE — Progress Notes (Signed)
[facility]  [phone] [phone]  ID: Erik Shelton OB: 1941-07-25  MR#: [id number]  PZW#:[id number]  Patient Care Team: [name], MD as PCP - General (Internal Medicine)  CHIEF COMPLAINT:  Chief Complaint  Patient presents with  . Follow-up    lymphoma  . Chemotherapy    initial    INTERVAL HISTORY: Patient returns to clinic today for further evaluation and initiation of palliative chemotherapy using etoposide plus CHOP. He continues to feel weak and fatigued. He continues to have significant abdominal pain. He denies any fevers or night sweats. He has a fair appetite, but denies weight loss. He has no chest pain or shortness of breath. He denies any nausea, vomiting, constipation, or diarrhea. Patient feels generally terrible, but offers no further specific complaints.  REVIEW OF SYSTEMS:   Review of Systems  Constitutional: Positive for malaise/fatigue. Negative for fever.  Respiratory: Negative.   Cardiovascular: Negative.   Gastrointestinal: Positive for abdominal pain.  Musculoskeletal: Negative.   Neurological: Positive for weakness.    As per HPI. Otherwise, a complete review of systems is negatve.  PAST MEDICAL HISTORY: Past Medical History  Diagnosis Date  . Stroke   . Seizures   . Hypercholesteremia   . HTN (hypertension)   . Cancer     T-Cell Lymphoma  . Seizures     PAST SURGICAL HISTORY: Past Surgical History  Procedure Laterality Date  . Peripheral vascular catheterization N/A 04/24/2015    Procedure: Glori Luis Cath Insertion;  Surgeon: Algernon Huxley, MD;  Location: [facility];  Service: Cardiovascular;  Laterality: N/A;    FAMILY HISTORY No family history on file.     ADVANCED DIRECTIVES:    HEALTH MAINTENANCE: Social History  Substance Use Topics  . Smoking status: Former Research scientist (life sciences)  . Smokeless tobacco: Not on file     Comment: Quit  in 1990  . Alcohol Use: Yes     Comment:  Occassional glass of wine     Colonoscopy:  PAP:  Bone density:  Lipid panel:  Allergies  Allergen Reactions  . No Known Allergies     Current Outpatient Prescriptions  Medication Sig Dispense Refill  . acetaminophen (TYLENOL) 325 MG tablet Take 650 mg by mouth every 6 (six) hours as needed for mild pain, moderate pain, fever or headache.    Marland Kitchen aspirin 81 MG tablet Take 81 mg by mouth daily.    Marland Kitchen losartan (COZAAR) 50 MG tablet Take 50 mg by mouth daily.    Marland Kitchen omeprazole (PRILOSEC) 40 MG capsule Take 1 capsule (40 mg total) by mouth daily. 30 capsule 2  . ondansetron (ZOFRAN) 4 MG tablet Take 1-2 tablets (4-8 mg total) by mouth every 8 (eight) hours as needed for nausea (not responsive to prochlorperazine (COMPAZINE)). 20 tablet 0  . pravastatin (PRAVACHOL) 80 MG tablet Take 80 mg by mouth daily.    Marland Kitchen senna-docusate (SENOKOT-S) 8.6-50 MG per tablet Take 1 tablet by mouth at bedtime as needed for mild constipation. 30 tablet 2  . lidocaine-prilocaine (EMLA) cream Apply 1 application topically as needed. 30 g 2  . oxyCODONE-acetaminophen (ROXICET) 5-325 MG per tablet Take 1 tablet by mouth every 4 (four) hours as needed for severe pain. 60 tablet 0  . predniSONE (DELTASONE) 20 MG tablet Take 4.5 tablets (90mg ) daily on days 1 - 5 of chemotherapy 25 tablet 6   No current facility-administered medications for this visit.   Facility-Administered Medications Ordered in Other Visits  Medication  Dose Route Frequency Provider Last Rate Last Dose  . 0.9 %  sodium chloride infusion   Intravenous Continuous Lloyd Huger, MD   Stopped at 05/05/15 1525  . sodium chloride 0.9 % injection 10 mL  10 mL Intravenous PRN Lloyd Huger, MD   10 mL at 05/05/15 1340    OBJECTIVE: Filed Vitals:   05/03/15 1047  BP: 120/79  Pulse: 102  Temp: 98.1 F (36.7 C)  Resp: 18     Body mass index is 29 kg/(m^2).    ECOG FS:1 - Symptomatic but completely ambulatory  General: Well-developed,  well-nourished, no acute distress. Eyes: Pink conjunctiva, anicteric sclera. Lungs: Clear to auscultation bilaterally. Heart: Regular rate and rhythm. No rubs, murmurs, or gallops. Abdomen: Soft, nontender, nondistended. No organomegaly noted, normoactive bowel sounds. Musculoskeletal: No edema, cyanosis, or clubbing. Neuro: Alert, answering all questions appropriately. Cranial nerves grossly intact. Skin: No rashes or petechiae noted. Psych: Normal affect. Lymphatics: Easily palpable axillary lymphadenopathy.   LAB RESULTS:  Lab Results  Component Value Date   NA 133* 05/03/2015   K 4.4 05/03/2015   CL 102 05/03/2015   CO2 25 05/03/2015   GLUCOSE 139* 05/03/2015   BUN 16 05/03/2015   CREATININE 0.92 05/03/2015   CALCIUM 8.4* 05/03/2015   PROT 6.9 05/03/2015   ALBUMIN 3.9 05/03/2015   AST 15 05/03/2015   ALT 29 05/03/2015   ALKPHOS 193* 05/03/2015   BILITOT 0.8 05/03/2015   GFRNONAA >60 05/03/2015   GFRAA >60 05/03/2015    Lab Results  Component Value Date   WBC 10.4 05/03/2015   NEUTROABS 7.0* 05/03/2015   HGB 13.8 05/03/2015   HCT 41.4 05/03/2015   MCV 88.9 05/03/2015   PLT 234 05/03/2015     STUDIES: Ct Soft Tissue Neck W Contrast  04/10/2015   CLINICAL DATA:  Subsequent evaluation of a 74 year old male with history of lymphoma diagnosed 2 years ago status post chemotherapy and radiation therapy, complaining of weakness, nausea, vomiting, shortness of breath and chest pain for 1 week.  EXAM: CT OF THE NECK WITH CONTRAST  CT OF THE CHEST WITH CONTRAST  CT OF THE ABDOMEN AND PELVIS WITH CONTRAST  TECHNIQUE: Multidetector CT imaging of the neck was performed with intravenous contrast.; Multidetector CT imaging of the abdomen and pelvis was performed following the standard protocol during bolus administration of intravenous contrast.; Multidetector CT imaging of the chest was performed following the standard protocol during bolus administration of intravenous contrast.   CONTRAST:  144mL OMNIPAQUE IOHEXOL 350 MG/ML SOLN  COMPARISON:  PET-CT 12/22/2012  FINDINGS: CT NECK FINDINGS  Multiple prominent but nonenlarged lymph nodes are noted in the cervical regions bilaterally, largest of which is a right-sided submandibular lymph node measuring up to 8 mm in short axis. These have a normal appearing fatty hila, and are considered benign. Asymmetric somewhat mass-like appearing area in the region of the left pharyngeal tonsil, difficult to measure separate from the adjacent uvula, and partially obscured by beam hardening artifact from dental hardware.  CT CHEST FINDINGS  Mediastinum/Lymph Nodes: Heart size is normal. There is no significant pericardial fluid, thickening or pericardial calcification. There is atherosclerosis of the thoracic aorta, the great vessels of the mediastinum and the coronary arteries, including calcified atherosclerotic plaque in the left main, left anterior descending and right coronary arteries. Mildly enlarged low right paratracheal lymph node measuring 1 cm in short axis. Borderline enlarged AP window lymph node measuring 9 mm in short axis. Mildly enlarged lymph nodes  in the hilar regions bilaterally measuring up to 1 cm. Esophagus is normal in appearance. Extensive bilateral axillary and subpectoral lymphadenopathy (Right greater than left) measuring up to 2.5 cm in the right axilla.  Lungs/Pleura: Mild scarring in the inferior segment of the lingula. No suspicious appearing pulmonary nodules or masses. No acute consolidative airspace disease. No pleural effusions.  Musculoskeletal/Soft Tissues: There are no aggressive appearing lytic or blastic lesions noted in the visualized portions of the skeleton.  CT ABDOMEN AND PELVIS FINDINGS  Hepatobiliary: Mild periportal edema. No suspicious cystic or solid hepatic lesions. No intra or extrahepatic biliary ductal dilatation. Gallbladder is nearly completely decompressed, but otherwise unremarkable in appearance.   Pancreas: No pancreatic mass. No pancreatic ductal dilatation. No pancreatic or peripancreatic fluid or inflammatory changes.  Spleen: Normal in size.  Unremarkable.  Adrenals/Urinary Tract: Bilateral adrenal glands are normal in appearance. Multiple well-circumscribed low-attenuation lesions associated with the kidneys bilaterally, compatible with simple cysts. No suspicious renal lesions. No hydroureteronephrosis. Urinary bladder is normal in appearance.  Stomach/Bowel: Normal appearance of the stomach. No pathologic dilatation of small bowel or colon. Numerous colonic diverticulae are noted, without surrounding inflammatory changes to suggest an acute diverticulitis at this time. Normal appendix.  Vascular/Lymphatic: Mild atherosclerosis throughout the abdominal and pelvic vasculature, without evidence of aneurysm or dissection. Extensive lymphadenopathy noted throughout the abdomen and pelvis. Specific examples include a 1.5 cm short axis portacaval lymph node, mildly enlarged left common iliac lymph node measuring 11 mm, bilateral obturator lymph nodes (right greater than left), measuring up to 1.9 cm on the right side, and bilateral inguinal lymph nodes (right greater than left), measuring up to 2.1 cm on the right side.  Reproductive: Prostate gland and seminal vesicles are unremarkable in appearance.  Other: Mild edema throughout the retroperitoneum. No significant volume of ascites. No pneumoperitoneum.  Musculoskeletal: There are no aggressive appearing lytic or blastic lesions noted in the visualized portions of the skeleton.  IMPRESSION: 1. Extensive lymphadenopathy throughout the chest, abdomen and pelvis, as above, compatible with recurrent lymphoma. 2. Asymmetric somewhat mass-like enlargement in the region of the left pharyngeal tonsil. Correlation with direct inspection is recommended to exclude the possibility of underlying neoplasm. 3. Atherosclerosis, including left main and 2 vessel coronary  artery disease. Assessment for potential risk factor modification, dietary therapy or pharmacologic therapy may be warranted, if clinically indicated. 4. Colonic diverticulosis without evidence of acute diverticulitis at this time. 5. Additional incidental findings, as above.   Electronically Signed   By: Vinnie Langton M.D.   On: 04/10/2015 10:23   Ct Chest W Contrast  04/10/2015   CLINICAL DATA:  Subsequent evaluation of a 74 year old male with history of lymphoma diagnosed 2 years ago status post chemotherapy and radiation therapy, complaining of weakness, nausea, vomiting, shortness of breath and chest pain for 1 week.  EXAM: CT OF THE NECK WITH CONTRAST  CT OF THE CHEST WITH CONTRAST  CT OF THE ABDOMEN AND PELVIS WITH CONTRAST  TECHNIQUE: Multidetector CT imaging of the neck was performed with intravenous contrast.; Multidetector CT imaging of the abdomen and pelvis was performed following the standard protocol during bolus administration of intravenous contrast.; Multidetector CT imaging of the chest was performed following the standard protocol during bolus administration of intravenous contrast.  CONTRAST:  135mL OMNIPAQUE IOHEXOL 350 MG/ML SOLN  COMPARISON:  PET-CT 12/22/2012  FINDINGS: CT NECK FINDINGS  Multiple prominent but nonenlarged lymph nodes are noted in the cervical regions bilaterally, largest of which is a right-sided submandibular  lymph node measuring up to 8 mm in short axis. These have a normal appearing fatty hila, and are considered benign. Asymmetric somewhat mass-like appearing area in the region of the left pharyngeal tonsil, difficult to measure separate from the adjacent uvula, and partially obscured by beam hardening artifact from dental hardware.  CT CHEST FINDINGS  Mediastinum/Lymph Nodes: Heart size is normal. There is no significant pericardial fluid, thickening or pericardial calcification. There is atherosclerosis of the thoracic aorta, the great vessels of the mediastinum  and the coronary arteries, including calcified atherosclerotic plaque in the left main, left anterior descending and right coronary arteries. Mildly enlarged low right paratracheal lymph node measuring 1 cm in short axis. Borderline enlarged AP window lymph node measuring 9 mm in short axis. Mildly enlarged lymph nodes in the hilar regions bilaterally measuring up to 1 cm. Esophagus is normal in appearance. Extensive bilateral axillary and subpectoral lymphadenopathy (Right greater than left) measuring up to 2.5 cm in the right axilla.  Lungs/Pleura: Mild scarring in the inferior segment of the lingula. No suspicious appearing pulmonary nodules or masses. No acute consolidative airspace disease. No pleural effusions.  Musculoskeletal/Soft Tissues: There are no aggressive appearing lytic or blastic lesions noted in the visualized portions of the skeleton.  CT ABDOMEN AND PELVIS FINDINGS  Hepatobiliary: Mild periportal edema. No suspicious cystic or solid hepatic lesions. No intra or extrahepatic biliary ductal dilatation. Gallbladder is nearly completely decompressed, but otherwise unremarkable in appearance.  Pancreas: No pancreatic mass. No pancreatic ductal dilatation. No pancreatic or peripancreatic fluid or inflammatory changes.  Spleen: Normal in size.  Unremarkable.  Adrenals/Urinary Tract: Bilateral adrenal glands are normal in appearance. Multiple well-circumscribed low-attenuation lesions associated with the kidneys bilaterally, compatible with simple cysts. No suspicious renal lesions. No hydroureteronephrosis. Urinary bladder is normal in appearance.  Stomach/Bowel: Normal appearance of the stomach. No pathologic dilatation of small bowel or colon. Numerous colonic diverticulae are noted, without surrounding inflammatory changes to suggest an acute diverticulitis at this time. Normal appendix.  Vascular/Lymphatic: Mild atherosclerosis throughout the abdominal and pelvic vasculature, without evidence of  aneurysm or dissection. Extensive lymphadenopathy noted throughout the abdomen and pelvis. Specific examples include a 1.5 cm short axis portacaval lymph node, mildly enlarged left common iliac lymph node measuring 11 mm, bilateral obturator lymph nodes (right greater than left), measuring up to 1.9 cm on the right side, and bilateral inguinal lymph nodes (right greater than left), measuring up to 2.1 cm on the right side.  Reproductive: Prostate gland and seminal vesicles are unremarkable in appearance.  Other: Mild edema throughout the retroperitoneum. No significant volume of ascites. No pneumoperitoneum.  Musculoskeletal: There are no aggressive appearing lytic or blastic lesions noted in the visualized portions of the skeleton.  IMPRESSION: 1. Extensive lymphadenopathy throughout the chest, abdomen and pelvis, as above, compatible with recurrent lymphoma. 2. Asymmetric somewhat mass-like enlargement in the region of the left pharyngeal tonsil. Correlation with direct inspection is recommended to exclude the possibility of underlying neoplasm. 3. Atherosclerosis, including left main and 2 vessel coronary artery disease. Assessment for potential risk factor modification, dietary therapy or pharmacologic therapy may be warranted, if clinically indicated. 4. Colonic diverticulosis without evidence of acute diverticulitis at this time. 5. Additional incidental findings, as above.   Electronically Signed   By: Vinnie Langton M.D.   On: 04/10/2015 10:23   Nm Cardiac Muga Rest  05/01/2015   CLINICAL DATA:  Peripheral T-cell lymphoma. Cardiac evaluation for chemotherapy.  EXAM: NUCLEAR MEDICINE CARDIAC BLOOD POOL IMAGING (  MUGA)  TECHNIQUE: Cardiac multi-gated acquisition was performed at rest following intravenous injection of Tc-54m labeled red blood cells.  RADIOPHARMACEUTICALS:  22.1 mCi Tc-65m MDP in-vitro labeled red blood cells IV  COMPARISON:  10/13/2012.  FINDINGS: No  focal wall motion abnormality of the  left ventricle.  Calculated left ventricular ejection fraction equals 58%. This compares to 53% on 10/13/2012.  IMPRESSION: Left ventricular ejection fraction equals58 %.   Electronically Signed   By: Suzy Bouchard M.D.   On: 05/01/2015 14:26   Ct Abdomen Pelvis W Contrast  04/10/2015   CLINICAL DATA:  Subsequent evaluation of a 74 year old male with history of lymphoma diagnosed 2 years ago status post chemotherapy and radiation therapy, complaining of weakness, nausea, vomiting, shortness of breath and chest pain for 1 week.  EXAM: CT OF THE NECK WITH CONTRAST  CT OF THE CHEST WITH CONTRAST  CT OF THE ABDOMEN AND PELVIS WITH CONTRAST  TECHNIQUE: Multidetector CT imaging of the neck was performed with intravenous contrast.; Multidetector CT imaging of the abdomen and pelvis was performed following the standard protocol during bolus administration of intravenous contrast.; Multidetector CT imaging of the chest was performed following the standard protocol during bolus administration of intravenous contrast.  CONTRAST:  156mL OMNIPAQUE IOHEXOL 350 MG/ML SOLN  COMPARISON:  PET-CT 12/22/2012  FINDINGS: CT NECK FINDINGS  Multiple prominent but nonenlarged lymph nodes are noted in the cervical regions bilaterally, largest of which is a right-sided submandibular lymph node measuring up to 8 mm in short axis. These have a normal appearing fatty hila, and are considered benign. Asymmetric somewhat mass-like appearing area in the region of the left pharyngeal tonsil, difficult to measure separate from the adjacent uvula, and partially obscured by beam hardening artifact from dental hardware.  CT CHEST FINDINGS  Mediastinum/Lymph Nodes: Heart size is normal. There is no significant pericardial fluid, thickening or pericardial calcification. There is atherosclerosis of the thoracic aorta, the great vessels of the mediastinum and the coronary arteries, including calcified atherosclerotic plaque in the left main, left  anterior descending and right coronary arteries. Mildly enlarged low right paratracheal lymph node measuring 1 cm in short axis. Borderline enlarged AP window lymph node measuring 9 mm in short axis. Mildly enlarged lymph nodes in the hilar regions bilaterally measuring up to 1 cm. Esophagus is normal in appearance. Extensive bilateral axillary and subpectoral lymphadenopathy (Right greater than left) measuring up to 2.5 cm in the right axilla.  Lungs/Pleura: Mild scarring in the inferior segment of the lingula. No suspicious appearing pulmonary nodules or masses. No acute consolidative airspace disease. No pleural effusions.  Musculoskeletal/Soft Tissues: There are no aggressive appearing lytic or blastic lesions noted in the visualized portions of the skeleton.  CT ABDOMEN AND PELVIS FINDINGS  Hepatobiliary: Mild periportal edema. No suspicious cystic or solid hepatic lesions. No intra or extrahepatic biliary ductal dilatation. Gallbladder is nearly completely decompressed, but otherwise unremarkable in appearance.  Pancreas: No pancreatic mass. No pancreatic ductal dilatation. No pancreatic or peripancreatic fluid or inflammatory changes.  Spleen: Normal in size.  Unremarkable.  Adrenals/Urinary Tract: Bilateral adrenal glands are normal in appearance. Multiple well-circumscribed low-attenuation lesions associated with the kidneys bilaterally, compatible with simple cysts. No suspicious renal lesions. No hydroureteronephrosis. Urinary bladder is normal in appearance.  Stomach/Bowel: Normal appearance of the stomach. No pathologic dilatation of small bowel or colon. Numerous colonic diverticulae are noted, without surrounding inflammatory changes to suggest an acute diverticulitis at this time. Normal appendix.  Vascular/Lymphatic: Mild atherosclerosis throughout the abdominal  and pelvic vasculature, without evidence of aneurysm or dissection. Extensive lymphadenopathy noted throughout the abdomen and pelvis.  Specific examples include a 1.5 cm short axis portacaval lymph node, mildly enlarged left common iliac lymph node measuring 11 mm, bilateral obturator lymph nodes (right greater than left), measuring up to 1.9 cm on the right side, and bilateral inguinal lymph nodes (right greater than left), measuring up to 2.1 cm on the right side.  Reproductive: Prostate gland and seminal vesicles are unremarkable in appearance.  Other: Mild edema throughout the retroperitoneum. No significant volume of ascites. No pneumoperitoneum.  Musculoskeletal: There are no aggressive appearing lytic or blastic lesions noted in the visualized portions of the skeleton.  IMPRESSION: 1. Extensive lymphadenopathy throughout the chest, abdomen and pelvis, as above, compatible with recurrent lymphoma. 2. Asymmetric somewhat mass-like enlargement in the region of the left pharyngeal tonsil. Correlation with direct inspection is recommended to exclude the possibility of underlying neoplasm. 3. Atherosclerosis, including left main and 2 vessel coronary artery disease. Assessment for potential risk factor modification, dietary therapy or pharmacologic therapy may be warranted, if clinically indicated. 4. Colonic diverticulosis without evidence of acute diverticulitis at this time. 5. Additional incidental findings, as above.   Electronically Signed   By: Vinnie Langton M.D.   On: 04/10/2015 10:23   Nm Pet Image Initial (pi) Skull Base To Thigh  04/28/2015   CLINICAL DATA:  Subsequent Treatment strategy for lymphoma.  EXAM: NUCLEAR MEDICINE PET SKULL BASE TO THIGH  TECHNIQUE: 13.4 mCi F-18 FDG was injected intravenously. Full-ring PET imaging was performed from the skull base to thigh after the radiotracer. CT data was obtained and used for attenuation correction and anatomic localization.  FASTING BLOOD GLUCOSE:  Value: 126 mg/dl  COMPARISON:  12/22/2012  FINDINGS: NECK  No hypermetabolic lymph nodes in the neck.  CHEST  There are enlarged and  hypermetabolic bilateral axillary lymph nodes identified. Index right axillary node measures 2.3 cm, image 87/series 3. This has an SUV max equal to 3.79. Left axillary lymph node measures 1.5cm, image 77/series 3. This has an SUV max equal to 3.79. Hypermetabolic mediastinal and bilateral hilar lymph nodes are identified. Index right paratracheal lymph node measure 1.3 cm, image 94/series 3. This has an SUV max equal to 4.0. Hypermetabolic pre-vascular lymph node measures 1.2 cm, image 94/series 3. The SUV max associated with this node is equal to 4.14. Right hilar node measures 1.6 cm, image 102/series 3. The SUV max associated with this node is equal to 4.55.  No pleural effusion identified. No hypermetabolic pulmonary nodule or mass noted.  ABDOMEN/PELVIS  Borderline enlarged upper abdominal lymph nodes are identified exhibiting mild increased uptake. Index gastrohepatic ligament node measures 1 cm, image 146/series 3. The SUV max is equal to 3.8. Index aortocaval lymph node measure 1 cm. The SUV max is equal to 3.3, image 199/series 3. Enlarged and hypermetabolic iliac and bilateral inguinal lymph nodes are identified. Index right external iliac node measures 1.4 cm and has an SUV max equal to 3.88, image 245/series 3. Index right inguinal node measure 2 cm and has an SUV max equal 2.8, image 271/series 3. Left inguinal node measures 1.3 cm and has an SUV max equal to 3.1, image number 271/series 3.  No abnormal radiotracer activity within the liver, pancreas, spleen, or adrenal glands.  SKELETON  There is mild diffuse increased radiotracer uptake throughout the axial and appendicular skeleton.  IMPRESSION: 1. Hypermetabolic axillary, mediastinal, hilar, retroperitoneal and pelvic adenopathy identified consistent with recurrent  lymphoma.   Electronically Signed   By: Kerby Moors M.D.   On: 04/28/2015 12:19   [place] Core Biopsy  04/14/2015   CLINICAL DATA:  Adenopathy  EXAM: ULTRASOUND-GUIDED BIOPSY RIGHT  AXILLARY LYMPH NODE CORE BIOPSY  MEDICATIONS AND MEDICAL HISTORY: Versed 1 mg, Fentanyl 50 mcg.  Additional Medications: None.  ANESTHESIA/SEDATION: Moderate sedation time: 7 minutes  PROCEDURE: The procedure, risks, benefits, and alternatives were explained to the patient. Questions regarding the procedure were encouraged and answered. The patient understands and consents to the procedure.  The right axilla was prepped with ChloraPrep in a sterile fashion, and a sterile drape was applied covering the operative field. A sterile gown and sterile gloves were used for the procedure.  Under sonographic guidance, 6 18 gauge core biopsies of an enlarged right axillary lymph node were obtained. Final imaging was performed.  Patient tolerated the procedure well without complication. Vital sign monitoring by nursing staff during the procedure will continue as patient is in the special procedures unit for post procedure observation.  FINDINGS: The images document guide needle placement within the right axillary lymph node. Post biopsy images demonstrate no hemorrhage.  COMPLICATIONS: None.  IMPRESSION: Successful ultrasound-guided right axillary lymph node core biopsy.   Electronically Signed   By: Marybelle Killings M.D.   On: 04/14/2015 14:22   Ct Biopsy  04/14/2015   CLINICAL DATA:  Widespread adenopathy.  History of the lymphoma.  EXAM: CT-GUIDED BIOPSY BONE MARROW ASPIRATE AND CORE.  MEDICATIONS AND MEDICAL HISTORY: Versed 2.5 mg, Fentanyl 100 mcg.  Additional Medications: None.  ANESTHESIA/SEDATION: Moderate sedation time: 17 minutes  PROCEDURE: The procedure, risks, benefits, and alternatives were explained to the patient. Questions regarding the procedure were encouraged and answered. The patient understands and consents to the procedure.  The back was prepped with ChloraPrep in a sterile fashion, and a sterile drape was applied covering the operative field. A sterile gown and sterile gloves were used for the procedure.   Under CT guidance, an 11 gauge needle was inserted into the right iliac bone. Aspirates and a core were obtained. Final imaging was performed.  Patient tolerated the procedure well without complication. Vital sign monitoring by nursing staff during the procedure will continue as patient is in the special procedures unit for post procedure observation.  FINDINGS: The images document guide needle placement within the right iliac bone. Post biopsy images demonstrate no hemorrhage.  COMPLICATIONS: None  IMPRESSION: Successful CT-guided bone marrow aspirate and core.   Electronically Signed   By: Marybelle Killings M.D.   On: 04/14/2015 14:20   Dg Chest Port 1 View  04/26/2015   CLINICAL DATA:  Lymphoma.  Syncope.  EXAM: PORTABLE CHEST - 1 VIEW  COMPARISON:  CT chest 04/10/2015  FINDINGS: Cardiac enlargement. Mild anterior mediastinal adenopathy. Right peritracheal soft tissue density corresponding to vascular structures on CT. Mild hilar adenopathy best seen on CT. Port-A-Cath tip in the SVC  Negative for pneumonia or heart failure. Mild bibasilar atelectasis.  IMPRESSION: Mild adenopathy.  Mild bibasilar atelectasis.   Electronically Signed   By: Franchot Gallo M.D.   On: 04/26/2015 11:44    ASSESSMENT: Recurrent stage IV T-cell lymphoma.  PLAN:    1. T-cell lymphoma: Imaging and pathology results reviewed independently confirming recurrent disease. Previously, his disease with CD30 positive, it is now CD30 negative. Patient completed CHOP therapy over 2 years ago. Will proceed with cycle 1 of CHOP plus etoposide. Will have to monitor Adriamycin dose as patient is likely  nearing his lifetime dose. Return to clinic in 1 and 2 days for etoposide only. Patient will also receive an Onpro Neulasta on day 3 of treatment. Return to clinic weekly for laboratory work, in 1 week for further evaluation, and then in 3 weeks for consideration of cycle 2. Plan to reimage at the conclusion of cycle 4.  Patient expressed  understanding and was in agreement with this plan. He also understands that He can call clinic at any time with any questions, concerns, or complaints.   Lloyd Huger, MD   05/05/2015 3:59 PM

## 2015-05-08 ENCOUNTER — Telehealth: Payer: Self-pay | Admitting: *Deleted

## 2015-05-08 ENCOUNTER — Telehealth: Payer: Self-pay | Admitting: Hematology and Oncology

## 2015-05-08 NOTE — Telephone Encounter (Signed)
Re:  On Pro  Patient unclear if on body Neulasta worked.  He heard the beep but did not feel an injection.  I asked him to bring it in on Monday to determine if dose given.  He stated that he needed a ride with the van.  Lequita Asal, MD

## 2015-05-08 NOTE — Telephone Encounter (Signed)
Family member concerned that pt did not receive neulasta injection on Saturday. No leakage was noted after removing on-body injector and device reads "empty.". Reassured family member that if did not see any leakage then pt most likely received injection. Instructed to monitor for signs of fever and will follow up on labs at next appointment on Wednesday. Family verbalized understanding.

## 2015-05-10 ENCOUNTER — Inpatient Hospital Stay: Payer: Medicare Other

## 2015-05-10 ENCOUNTER — Inpatient Hospital Stay (HOSPITAL_BASED_OUTPATIENT_CLINIC_OR_DEPARTMENT_OTHER): Payer: Medicare Other | Admitting: Oncology

## 2015-05-10 ENCOUNTER — Other Ambulatory Visit: Payer: Medicare Other

## 2015-05-10 ENCOUNTER — Ambulatory Visit: Payer: Medicare Other | Admitting: Oncology

## 2015-05-10 VITALS — BP 106/70 | HR 125 | Temp 97.4°F | Resp 18 | Wt 191.4 lb

## 2015-05-10 DIAGNOSIS — E701 Other hyperphenylalaninemias: Secondary | ICD-10-CM

## 2015-05-10 DIAGNOSIS — E780 Pure hypercholesterolemia: Secondary | ICD-10-CM

## 2015-05-10 DIAGNOSIS — I10 Essential (primary) hypertension: Secondary | ICD-10-CM

## 2015-05-10 DIAGNOSIS — C8448 Peripheral T-cell lymphoma, not classified, lymph nodes of multiple sites: Secondary | ICD-10-CM

## 2015-05-10 DIAGNOSIS — R5383 Other fatigue: Secondary | ICD-10-CM

## 2015-05-10 DIAGNOSIS — R109 Unspecified abdominal pain: Secondary | ICD-10-CM

## 2015-05-10 DIAGNOSIS — R531 Weakness: Secondary | ICD-10-CM

## 2015-05-10 DIAGNOSIS — Z5111 Encounter for antineoplastic chemotherapy: Secondary | ICD-10-CM | POA: Diagnosis not present

## 2015-05-10 DIAGNOSIS — I2510 Atherosclerotic heart disease of native coronary artery without angina pectoris: Secondary | ICD-10-CM

## 2015-05-10 DIAGNOSIS — R630 Anorexia: Secondary | ICD-10-CM

## 2015-05-10 DIAGNOSIS — T451X5S Adverse effect of antineoplastic and immunosuppressive drugs, sequela: Secondary | ICD-10-CM

## 2015-05-10 DIAGNOSIS — C8442 Peripheral T-cell lymphoma, not classified, intrathoracic lymph nodes: Secondary | ICD-10-CM

## 2015-05-10 DIAGNOSIS — R110 Nausea: Secondary | ICD-10-CM

## 2015-05-10 DIAGNOSIS — R5381 Other malaise: Secondary | ICD-10-CM

## 2015-05-10 LAB — COMPREHENSIVE METABOLIC PANEL
ALT: 52 U/L (ref 17–63)
AST: 19 U/L (ref 15–41)
Albumin: 4.2 g/dL (ref 3.5–5.0)
Alkaline Phosphatase: 143 U/L — ABNORMAL HIGH (ref 38–126)
Anion gap: 5 (ref 5–15)
BUN: 16 mg/dL (ref 6–20)
CHLORIDE: 102 mmol/L (ref 101–111)
CO2: 27 mmol/L (ref 22–32)
CREATININE: 0.92 mg/dL (ref 0.61–1.24)
Calcium: 8.5 mg/dL — ABNORMAL LOW (ref 8.9–10.3)
Glucose, Bld: 171 mg/dL — ABNORMAL HIGH (ref 65–99)
POTASSIUM: 3.9 mmol/L (ref 3.5–5.1)
Sodium: 134 mmol/L — ABNORMAL LOW (ref 135–145)
TOTAL PROTEIN: 7.2 g/dL (ref 6.5–8.1)
Total Bilirubin: 0.7 mg/dL (ref 0.3–1.2)

## 2015-05-10 LAB — CBC WITH DIFFERENTIAL/PLATELET
BASOS ABS: 0 10*3/uL (ref 0–0.1)
Basophils Relative: 1 %
EOS ABS: 0.1 10*3/uL (ref 0–0.7)
HCT: 41.4 % (ref 40.0–52.0)
HEMOGLOBIN: 13.5 g/dL (ref 13.0–18.0)
LYMPHS ABS: 1 10*3/uL (ref 1.0–3.6)
Lymphocytes Relative: 77 %
MCH: 29.4 pg (ref 26.0–34.0)
MCHC: 32.6 g/dL (ref 32.0–36.0)
MCV: 90 fL (ref 80.0–100.0)
Monocytes Absolute: 0 10*3/uL — ABNORMAL LOW (ref 0.2–1.0)
Monocytes Relative: 2 %
Neutro Abs: 0.2 10*3/uL — ABNORMAL LOW (ref 1.4–6.5)
PLATELETS: 129 10*3/uL — AB (ref 150–440)
RBC: 4.6 MIL/uL (ref 4.40–5.90)
RDW: 13.9 % (ref 11.5–14.5)
WBC: 1.3 10*3/uL — AB (ref 3.8–10.6)

## 2015-05-10 NOTE — Progress Notes (Signed)
Since his treatment patient is feeling weak, loss of appetite, occasional diarrhea, SOBr on exertion.  When he eats he has abdominal pain so does not want to eat.  The last time he had diarrhea was yesterday.  They are concerned that the On-pro nuelasta auto injector did not inject any medication.

## 2015-05-17 ENCOUNTER — Inpatient Hospital Stay: Payer: Medicare Other

## 2015-05-17 DIAGNOSIS — C8442 Peripheral T-cell lymphoma, not classified, intrathoracic lymph nodes: Secondary | ICD-10-CM

## 2015-05-17 DIAGNOSIS — Z5111 Encounter for antineoplastic chemotherapy: Secondary | ICD-10-CM | POA: Diagnosis not present

## 2015-05-17 LAB — COMPREHENSIVE METABOLIC PANEL
ALT: 25 U/L (ref 17–63)
ANION GAP: 5 (ref 5–15)
AST: 22 U/L (ref 15–41)
Albumin: 3.6 g/dL (ref 3.5–5.0)
Alkaline Phosphatase: 177 U/L — ABNORMAL HIGH (ref 38–126)
BUN: 10 mg/dL (ref 6–20)
CHLORIDE: 103 mmol/L (ref 101–111)
CO2: 29 mmol/L (ref 22–32)
Calcium: 8.2 mg/dL — ABNORMAL LOW (ref 8.9–10.3)
Creatinine, Ser: 0.82 mg/dL (ref 0.61–1.24)
GFR calc non Af Amer: >60 mL/min (ref >60)
Glucose, Bld: 130 mg/dL — ABNORMAL HIGH (ref 65–99)
POTASSIUM: 4.3 mmol/L (ref 3.5–5.1)
SODIUM: 137 mmol/L (ref 135–145)
Total Bilirubin: 0.3 mg/dL (ref 0.3–1.2)
Total Protein: 6.7 g/dL (ref 6.5–8.1)

## 2015-05-17 LAB — CBC WITH DIFFERENTIAL/PLATELET
BAND NEUTROPHILS: 18 % — AB (ref 0–10)
BLASTS: 0 %
Basophils Relative: 0 %
EOS PCT: 0 %
HEMATOCRIT: 36.8 % — AB (ref 40.0–52.0)
Hemoglobin: 12 g/dL — ABNORMAL LOW (ref 13.0–18.0)
LYMPHS PCT: 12 %
MCH: 28.9 pg (ref 26.0–34.0)
MCHC: 32.5 g/dL (ref 32.0–36.0)
MCV: 88.8 fL (ref 80.0–100.0)
MONOS PCT: 8 %
Metamyelocytes Relative: 2 %
Myelocytes: 5 %
NEUTROS PCT: 53 %
PROMYELOCYTES ABS: 2 %
Platelets: 337 10*3/uL (ref 150–440)
RBC: 4.15 MIL/uL — AB (ref 4.40–5.90)
RDW: 14.1 % (ref 11.5–14.5)
WBC: 35.5 10*3/uL — ABNORMAL HIGH (ref 3.8–10.6)

## 2015-05-18 NOTE — Progress Notes (Signed)
[place]  Telephone:(336) [phone] Fax:(336) [phone]  ID: Velvet Bathe OB: [birth year]  MR#: [id number]  UGQ#:[id number]  Patient Care Team: [name], MD as PCP - General (Internal Medicine)  CHIEF COMPLAINT:  Chief Complaint  Patient presents with  . Follow-up    T cell lymphoma    INTERVAL HISTORY: Patient returns to clinic today for further evaluation and to assess his toleration of cycle 1 of etoposide plus CHOP. He continues to feel weak and fatigued. He does not complain of abdominal pain today, but has increased nausea and a poor appetite. He has no chest pain or shortness of breath. He denies any nausea, vomiting, constipation, or diarrhea. Patient feels generally terrible, but offers no further specific complaints.  REVIEW OF SYSTEMS:   Review of Systems  Constitutional: Positive for malaise/fatigue. Negative for fever.  Respiratory: Negative.   Cardiovascular: Negative.   Gastrointestinal: Positive for nausea. Negative for abdominal pain.  Musculoskeletal: Negative.   Neurological: Positive for weakness.    As per HPI. Otherwise, a complete review of systems is negatve.  PAST MEDICAL HISTORY: Past Medical History  Diagnosis Date  . Stroke   . Seizures   . Hypercholesteremia   . HTN (hypertension)   . Cancer     T-Cell Lymphoma  . Seizures     PAST SURGICAL HISTORY: Past Surgical History  Procedure Laterality Date  . Peripheral vascular catheterization N/A 04/24/2015    Procedure: Glori Luis Cath Insertion;  Surgeon: Algernon Huxley, MD;  Location: [facility];  Service: Cardiovascular;  Laterality: N/A;    FAMILY HISTORY No family history on file.     ADVANCED DIRECTIVES:    HEALTH MAINTENANCE: Social History  Substance Use Topics  . Smoking status: Former Research scientist (life sciences)  . Smokeless tobacco: Not on file     Comment: Quit  in 1990  . Alcohol Use: Yes     Comment: Occassional glass of wine      Colonoscopy:  PAP:  Bone density:  Lipid panel:  Allergies  Allergen Reactions  . No Known Allergies     Current Outpatient Prescriptions  Medication Sig Dispense Refill  . acetaminophen (TYLENOL) 325 MG tablet Take 650 mg by mouth every 6 (six) hours as needed for mild pain, moderate pain, fever or headache.    Marland Kitchen aspirin 81 MG tablet Take 81 mg by mouth daily.    Marland Kitchen lidocaine-prilocaine (EMLA) cream Apply 1 application topically as needed. 30 g 2  . losartan (COZAAR) 50 MG tablet Take 50 mg by mouth daily.    Marland Kitchen omeprazole (PRILOSEC) 40 MG capsule Take 1 capsule (40 mg total) by mouth daily. 30 capsule 2  . ondansetron (ZOFRAN) 4 MG tablet Take 1-2 tablets (4-8 mg total) by mouth every 8 (eight) hours as needed for nausea (not responsive to prochlorperazine (COMPAZINE)). 20 tablet 0  . oxyCODONE-acetaminophen (ROXICET) 5-325 MG per tablet Take 1 tablet by mouth every 4 (four) hours as needed for severe pain. 60 tablet 0  . pravastatin (PRAVACHOL) 80 MG tablet Take 80 mg by mouth daily.    . predniSONE (DELTASONE) 20 MG tablet Take 4.5 tablets (90mg ) daily on days 1 - 5 of chemotherapy 25 tablet 6  . senna-docusate (SENOKOT-S) 8.6-50 MG per tablet Take 1 tablet by mouth at bedtime as needed for mild constipation. 30 tablet 2   No current facility-administered medications for this visit.    OBJECTIVE: Filed Vitals:   05/10/15 0930  BP: 106/70  Pulse: 125  Temp: 97.4 F (36.3 C)  Resp: 18     Body mass index is 28.34 kg/(m^2).    ECOG FS:2 - Symptomatic, <50% confined to bed  General: Well-developed, well-nourished, no acute distress. Eyes: Pink conjunctiva, anicteric sclera. Lungs: Clear to auscultation bilaterally. Heart: Regular rate and rhythm. No rubs, murmurs, or gallops. Abdomen: Soft, nontender, nondistended. No organomegaly noted, normoactive bowel sounds. Musculoskeletal: No edema, cyanosis, or clubbing. Neuro: Alert, answering all questions appropriately.  Cranial nerves grossly intact. Skin: No rashes or petechiae noted. Psych: Normal affect. Lymphatics: Easily palpable axillary lymphadenopathy.   LAB RESULTS:  Lab Results  Component Value Date   NA 137 05/17/2015   K 4.3 05/17/2015   CL 103 05/17/2015   CO2 29 05/17/2015   GLUCOSE 130* 05/17/2015   BUN 10 05/17/2015   CREATININE 0.82 05/17/2015   CALCIUM 8.2* 05/17/2015   PROT 6.7 05/17/2015   ALBUMIN 3.6 05/17/2015   AST 22 05/17/2015   ALT 25 05/17/2015   ALKPHOS 177* 05/17/2015   BILITOT 0.3 05/17/2015   GFRNONAA >60 05/17/2015   GFRAA >60 05/17/2015    Lab Results  Component Value Date   WBC 35.5* 05/17/2015   NEUTROABS 0.2* 05/10/2015   HGB 12.0* 05/17/2015   HCT 36.8* 05/17/2015   MCV 88.8 05/17/2015   PLT 337 05/17/2015     STUDIES: Nm Cardiac Muga Rest  05/01/2015   CLINICAL DATA:  Peripheral T-cell lymphoma. Cardiac evaluation for chemotherapy.  EXAM: NUCLEAR MEDICINE CARDIAC BLOOD POOL IMAGING (MUGA)  TECHNIQUE: Cardiac multi-gated acquisition was performed at rest following intravenous injection of Tc-34m labeled red blood cells.  RADIOPHARMACEUTICALS:  22.1 mCi Tc-76m MDP in-vitro labeled red blood cells IV  COMPARISON:  10/13/2012.  FINDINGS: No  focal wall motion abnormality of the left ventricle.  Calculated left ventricular ejection fraction equals 58%. This compares to 53% on 10/13/2012.  IMPRESSION: Left ventricular ejection fraction equals58 %.   Electronically Signed   By: Suzy Bouchard M.D.   On: 05/01/2015 14:26   Nm Pet Image Initial (pi) Skull Base To Thigh  04/28/2015   CLINICAL DATA:  Subsequent Treatment strategy for lymphoma.  EXAM: NUCLEAR MEDICINE PET SKULL BASE TO THIGH  TECHNIQUE: 13.4 mCi F-18 FDG was injected intravenously. Full-ring PET imaging was performed from the skull base to thigh after the radiotracer. CT data was obtained and used for attenuation correction and anatomic localization.  FASTING BLOOD GLUCOSE:  Value: 126 mg/dl   COMPARISON:  12/22/2012  FINDINGS: NECK  No hypermetabolic lymph nodes in the neck.  CHEST  There are enlarged and hypermetabolic bilateral axillary lymph nodes identified. Index right axillary node measures 2.3 cm, image 87/series 3. This has an SUV max equal to 3.79. Left axillary lymph node measures 1.5cm, image 77/series 3. This has an SUV max equal to 3.79. Hypermetabolic mediastinal and bilateral hilar lymph nodes are identified. Index right paratracheal lymph node measure 1.3 cm, image 94/series 3. This has an SUV max equal to 4.0. Hypermetabolic pre-vascular lymph node measures 1.2 cm, image 94/series 3. The SUV max associated with this node is equal to 4.14. Right hilar node measures 1.6 cm, image 102/series 3. The SUV max associated with this node is equal to 4.55.  No pleural effusion identified. No hypermetabolic pulmonary nodule or mass noted.  ABDOMEN/PELVIS  Borderline enlarged upper abdominal lymph nodes are identified exhibiting mild increased uptake. Index gastrohepatic ligament node measures 1 cm, image 146/series 3. The SUV max is equal to 3.8. Index aortocaval lymph node  measure 1 cm. The SUV max is equal to 3.3, image 199/series 3. Enlarged and hypermetabolic iliac and bilateral inguinal lymph nodes are identified. Index right external iliac node measures 1.4 cm and has an SUV max equal to 3.88, image 245/series 3. Index right inguinal node measure 2 cm and has an SUV max equal 2.8, image 271/series 3. Left inguinal node measures 1.3 cm and has an SUV max equal to 3.1, image number 271/series 3.  No abnormal radiotracer activity within the liver, pancreas, spleen, or adrenal glands.  SKELETON  There is mild diffuse increased radiotracer uptake throughout the axial and appendicular skeleton.  IMPRESSION: 1. Hypermetabolic axillary, mediastinal, hilar, retroperitoneal and pelvic adenopathy identified consistent with recurrent lymphoma.   Electronically Signed   By: Kerby Moors M.D.   On:  04/28/2015 12:19   Dg Chest Port 1 View  04/26/2015   CLINICAL DATA:  Lymphoma.  Syncope.  EXAM: PORTABLE CHEST - 1 VIEW  COMPARISON:  CT chest 04/10/2015  FINDINGS: Cardiac enlargement. Mild anterior mediastinal adenopathy. Right peritracheal soft tissue density corresponding to vascular structures on CT. Mild hilar adenopathy best seen on CT. Port-A-Cath tip in the SVC  Negative for pneumonia or heart failure. Mild bibasilar atelectasis.  IMPRESSION: Mild adenopathy.  Mild bibasilar atelectasis.   Electronically Signed   By: Franchot Gallo M.D.   On: 04/26/2015 11:44    ASSESSMENT: Recurrent stage IV T-cell lymphoma.  PLAN:    1. T-cell lymphoma: Imaging and pathology results reviewed independently confirming recurrent disease. Previously, his disease with CD30 positive, it is now CD30 negative. Patient tolerated cycle 1 of CHOP plus etoposide last week. Will have to monitor Adriamycin dose as patient is likely nearing his lifetime dose. Continue with Onpro Neulasta on day 3 of treatment. Return to clinic weekly for laboratory work and then in 2 weeks for consideration of cycle 2. Plan to reimage at the conclusion of cycle 4. 2. Neutropenia: Secondary to chemotherapy. OnPro Neulasta as above.  Patient expressed understanding and was in agreement with this plan. He also understands that He can call clinic at any time with any questions, concerns, or complaints.   Lloyd Huger, MD   05/18/2015 9:15 AM

## 2015-05-24 ENCOUNTER — Inpatient Hospital Stay (HOSPITAL_BASED_OUTPATIENT_CLINIC_OR_DEPARTMENT_OTHER): Payer: Medicare Other | Admitting: Oncology

## 2015-05-24 ENCOUNTER — Inpatient Hospital Stay: Payer: Medicare Other

## 2015-05-24 ENCOUNTER — Inpatient Hospital Stay: Payer: Medicare Other | Attending: Oncology

## 2015-05-24 VITALS — BP 144/81 | HR 101 | Temp 97.1°F | Resp 18 | Wt 192.0 lb

## 2015-05-24 DIAGNOSIS — Z8673 Personal history of transient ischemic attack (TIA), and cerebral infarction without residual deficits: Secondary | ICD-10-CM | POA: Diagnosis not present

## 2015-05-24 DIAGNOSIS — C8448 Peripheral T-cell lymphoma, not classified, lymph nodes of multiple sites: Secondary | ICD-10-CM

## 2015-05-24 DIAGNOSIS — Z79899 Other long term (current) drug therapy: Secondary | ICD-10-CM

## 2015-05-24 DIAGNOSIS — E780 Pure hypercholesterolemia: Secondary | ICD-10-CM | POA: Insufficient documentation

## 2015-05-24 DIAGNOSIS — C8442 Peripheral T-cell lymphoma, not classified, intrathoracic lymph nodes: Secondary | ICD-10-CM

## 2015-05-24 DIAGNOSIS — Z87891 Personal history of nicotine dependence: Secondary | ICD-10-CM

## 2015-05-24 DIAGNOSIS — Z418 Encounter for other procedures for purposes other than remedying health state: Secondary | ICD-10-CM | POA: Insufficient documentation

## 2015-05-24 DIAGNOSIS — R531 Weakness: Secondary | ICD-10-CM

## 2015-05-24 DIAGNOSIS — I10 Essential (primary) hypertension: Secondary | ICD-10-CM

## 2015-05-24 DIAGNOSIS — D72829 Elevated white blood cell count, unspecified: Secondary | ICD-10-CM

## 2015-05-24 DIAGNOSIS — Z5111 Encounter for antineoplastic chemotherapy: Secondary | ICD-10-CM | POA: Insufficient documentation

## 2015-05-24 DIAGNOSIS — R5381 Other malaise: Secondary | ICD-10-CM | POA: Insufficient documentation

## 2015-05-24 DIAGNOSIS — Z7952 Long term (current) use of systemic steroids: Secondary | ICD-10-CM

## 2015-05-24 DIAGNOSIS — Z7982 Long term (current) use of aspirin: Secondary | ICD-10-CM | POA: Diagnosis not present

## 2015-05-24 DIAGNOSIS — R5383 Other fatigue: Secondary | ICD-10-CM

## 2015-05-24 LAB — COMPREHENSIVE METABOLIC PANEL
ALBUMIN: 3.9 g/dL (ref 3.5–5.0)
ALT: 22 U/L (ref 17–63)
ANION GAP: 9 (ref 5–15)
AST: 18 U/L (ref 15–41)
Alkaline Phosphatase: 131 U/L — ABNORMAL HIGH (ref 38–126)
BILIRUBIN TOTAL: 0.3 mg/dL (ref 0.3–1.2)
BUN: 13 mg/dL (ref 6–20)
CHLORIDE: 101 mmol/L (ref 101–111)
CO2: 27 mmol/L (ref 22–32)
Calcium: 9.1 mg/dL (ref 8.9–10.3)
Creatinine, Ser: 0.81 mg/dL (ref 0.61–1.24)
GFR calc Af Amer: >60 mL/min (ref >60)
GFR calc non Af Amer: >60 mL/min (ref >60)
GLUCOSE: 144 mg/dL — AB (ref 65–99)
POTASSIUM: 3.9 mmol/L (ref 3.5–5.1)
SODIUM: 137 mmol/L (ref 135–145)
TOTAL PROTEIN: 7.1 g/dL (ref 6.5–8.1)

## 2015-05-24 LAB — CBC WITH DIFFERENTIAL/PLATELET
BASOS ABS: 0.1 10*3/uL (ref 0–0.1)
BASOS PCT: 1 %
EOS ABS: 0 10*3/uL (ref 0–0.7)
Eosinophils Relative: 0 %
HCT: 37.7 % — ABNORMAL LOW (ref 40.0–52.0)
Hemoglobin: 12.4 g/dL — ABNORMAL LOW (ref 13.0–18.0)
Lymphocytes Relative: 13 %
Lymphs Abs: 2.4 10*3/uL (ref 1.0–3.6)
MCH: 29.3 pg (ref 26.0–34.0)
MCHC: 32.8 g/dL (ref 32.0–36.0)
MCV: 89.3 fL (ref 80.0–100.0)
MONO ABS: 1.3 10*3/uL — AB (ref 0.2–1.0)
MONOS PCT: 7 %
Neutro Abs: 14.6 10*3/uL — ABNORMAL HIGH (ref 1.4–6.5)
Neutrophils Relative %: 79 %
PLATELETS: 411 10*3/uL (ref 150–440)
RBC: 4.23 MIL/uL — ABNORMAL LOW (ref 4.40–5.90)
RDW: 14.2 % (ref 11.5–14.5)
WBC: 18.4 10*3/uL — ABNORMAL HIGH (ref 3.8–10.6)

## 2015-05-24 MED ORDER — SODIUM CHLORIDE 0.9 % IV SOLN
80.0000 mg/m2 | Freq: Once | INTRAVENOUS | Status: AC
  Administered 2015-05-24: 170 mg via INTRAVENOUS
  Filled 2015-05-24: qty 8.5

## 2015-05-24 MED ORDER — SODIUM CHLORIDE 0.9 % IV SOLN
Freq: Once | INTRAVENOUS | Status: AC
  Administered 2015-05-24: 10:00:00 via INTRAVENOUS
  Filled 2015-05-24: qty 1000

## 2015-05-24 MED ORDER — SODIUM CHLORIDE 0.9 % IV SOLN
2.0000 mg | Freq: Once | INTRAVENOUS | Status: AC
  Administered 2015-05-24: 2 mg via INTRAVENOUS
  Filled 2015-05-24: qty 2

## 2015-05-24 MED ORDER — DOXORUBICIN HCL CHEMO IV INJECTION 2 MG/ML
50.0000 mg/m2 | Freq: Once | INTRAVENOUS | Status: AC
Start: 2015-05-24 — End: 2015-05-24
  Administered 2015-05-24: 108 mg via INTRAVENOUS
  Filled 2015-05-24: qty 54

## 2015-05-24 MED ORDER — HEPARIN SOD (PORK) LOCK FLUSH 100 UNIT/ML IV SOLN
500.0000 [IU] | Freq: Once | INTRAVENOUS | Status: AC | PRN
  Administered 2015-05-24: 500 [IU]
  Filled 2015-05-24: qty 5

## 2015-05-24 MED ORDER — SODIUM CHLORIDE 0.9 % IJ SOLN
10.0000 mL | INTRAMUSCULAR | Status: DC | PRN
  Administered 2015-05-24: 10 mL
  Filled 2015-05-24: qty 10

## 2015-05-24 MED ORDER — SODIUM CHLORIDE 0.9 % IV SOLN
750.0000 mg/m2 | Freq: Once | INTRAVENOUS | Status: AC
  Administered 2015-05-24: 1620 mg via INTRAVENOUS
  Filled 2015-05-24: qty 81

## 2015-05-24 MED ORDER — SODIUM CHLORIDE 0.9 % IV SOLN
Freq: Once | INTRAVENOUS | Status: AC
  Administered 2015-05-24: 11:00:00 via INTRAVENOUS
  Filled 2015-05-24: qty 8

## 2015-05-24 NOTE — Progress Notes (Signed)
Patient is feeling better since his last appointment.  The only concern he has had is 1 episode of leg weakness 2 days ago.

## 2015-05-25 ENCOUNTER — Inpatient Hospital Stay: Payer: Medicare Other

## 2015-05-25 VITALS — BP 125/75 | HR 101 | Temp 97.4°F | Resp 18

## 2015-05-25 DIAGNOSIS — C8448 Peripheral T-cell lymphoma, not classified, lymph nodes of multiple sites: Secondary | ICD-10-CM

## 2015-05-25 DIAGNOSIS — Z5111 Encounter for antineoplastic chemotherapy: Secondary | ICD-10-CM | POA: Diagnosis not present

## 2015-05-25 MED ORDER — SODIUM CHLORIDE 0.9 % IV SOLN
Freq: Once | INTRAVENOUS | Status: AC
  Administered 2015-05-25: 14:00:00 via INTRAVENOUS
  Filled 2015-05-25: qty 4

## 2015-05-25 MED ORDER — HEPARIN SOD (PORK) LOCK FLUSH 100 UNIT/ML IV SOLN
INTRAVENOUS | Status: AC
  Filled 2015-05-25: qty 5

## 2015-05-25 MED ORDER — HEPARIN SOD (PORK) LOCK FLUSH 100 UNIT/ML IV SOLN
500.0000 [IU] | Freq: Once | INTRAVENOUS | Status: AC
  Administered 2015-05-25: 500 [IU] via INTRAVENOUS

## 2015-05-25 MED ORDER — SODIUM CHLORIDE 0.9 % IV SOLN
80.0000 mg/m2 | Freq: Once | INTRAVENOUS | Status: AC
  Administered 2015-05-25: 170 mg via INTRAVENOUS
  Filled 2015-05-25: qty 8.5

## 2015-05-26 ENCOUNTER — Inpatient Hospital Stay: Payer: Medicare Other

## 2015-05-26 VITALS — BP 151/82 | HR 86 | Temp 96.5°F | Resp 20

## 2015-05-26 DIAGNOSIS — C8448 Peripheral T-cell lymphoma, not classified, lymph nodes of multiple sites: Secondary | ICD-10-CM

## 2015-05-26 DIAGNOSIS — Z5111 Encounter for antineoplastic chemotherapy: Secondary | ICD-10-CM | POA: Diagnosis not present

## 2015-05-26 MED ORDER — SODIUM CHLORIDE 0.9 % IJ SOLN
10.0000 mL | Freq: Once | INTRAMUSCULAR | Status: AC
Start: 2015-05-26 — End: 2015-05-26
  Administered 2015-05-26: 10 mL via INTRAVENOUS
  Filled 2015-05-26: qty 10

## 2015-05-26 MED ORDER — SODIUM CHLORIDE 0.9 % IV SOLN
80.0000 mg/m2 | Freq: Once | INTRAVENOUS | Status: AC
  Administered 2015-05-26: 170 mg via INTRAVENOUS
  Filled 2015-05-26: qty 8.5

## 2015-05-26 MED ORDER — SODIUM CHLORIDE 0.9 % IV SOLN
INTRAVENOUS | Status: DC
  Administered 2015-05-26: 14:00:00 via INTRAVENOUS
  Filled 2015-05-26: qty 1000

## 2015-05-26 MED ORDER — HEPARIN SOD (PORK) LOCK FLUSH 100 UNIT/ML IV SOLN
500.0000 [IU] | Freq: Once | INTRAVENOUS | Status: AC
Start: 2015-05-26 — End: 2015-05-26
  Administered 2015-05-26: 500 [IU] via INTRAVENOUS
  Filled 2015-05-26: qty 5

## 2015-05-26 MED ORDER — PEGFILGRASTIM 6 MG/0.6ML ~~LOC~~ PSKT
6.0000 mg | PREFILLED_SYRINGE | Freq: Once | SUBCUTANEOUS | Status: AC
  Administered 2015-05-26: 6 mg via SUBCUTANEOUS
  Filled 2015-05-26: qty 0.6

## 2015-05-26 MED ORDER — SODIUM CHLORIDE 0.9 % IV SOLN
Freq: Once | INTRAVENOUS | Status: AC
  Administered 2015-05-26: 14:00:00 via INTRAVENOUS
  Filled 2015-05-26: qty 4

## 2015-05-26 NOTE — Progress Notes (Signed)
[place]  Telephone:(336) [phone] Fax:(336) [phone]  ID: Velvet Bathe OB: 10/18/1940  MR#: [id number]  EUM#:[id number]  Patient Care Team: [name], MD as PCP - General (Internal Medicine)  CHIEF COMPLAINT:  Chief Complaint  Patient presents with  . Lymphoma    INTERVAL HISTORY: Patient returns to clinic today for further evaluation and consideration of cycle 2 of etoposide plus CHOP. He continues to feel weak and fatigued. He does not complain of abdominal pain today. His nausea and appetite have improved. He has no chest pain or shortness of breath. He denies any vomiting, constipation, or diarrhea. Patient offers no further specific complaints today.  REVIEW OF SYSTEMS:   Review of Systems  Constitutional: Positive for malaise/fatigue. Negative for fever.  Respiratory: Negative.   Cardiovascular: Negative.   Gastrointestinal: Negative for nausea and abdominal pain.  Musculoskeletal: Negative.   Neurological: Positive for weakness.    As per HPI. Otherwise, a complete review of systems is negatve.  PAST MEDICAL HISTORY: Past Medical History  Diagnosis Date  . Stroke   . Seizures   . Hypercholesteremia   . HTN (hypertension)   . Cancer     T-Cell Lymphoma  . Seizures     PAST SURGICAL HISTORY: Past Surgical History  Procedure Laterality Date  . Peripheral vascular catheterization N/A 04/24/2015    Procedure: Glori Luis Cath Insertion;  Surgeon: Algernon Huxley, MD;  Location: [facility];  Service: Cardiovascular;  Laterality: N/A;    FAMILY HISTORY No family history on file.     ADVANCED DIRECTIVES:    HEALTH MAINTENANCE: Social History  Substance Use Topics  . Smoking status: Former Research scientist (life sciences)  . Smokeless tobacco: Not on file     Comment: Quit  in 1990  . Alcohol Use: Yes     Comment: Occassional glass of wine     Colonoscopy:  PAP:  Bone density:  Lipid panel:  Allergies  Allergen Reactions  . No Known  Allergies     Current Outpatient Prescriptions  Medication Sig Dispense Refill  . acetaminophen (TYLENOL) 325 MG tablet Take 650 mg by mouth every 6 (six) hours as needed for mild pain, moderate pain, fever or headache.    Marland Kitchen aspirin 81 MG tablet Take 81 mg by mouth daily.    Marland Kitchen lidocaine-prilocaine (EMLA) cream Apply 1 application topically as needed. 30 g 2  . losartan (COZAAR) 50 MG tablet Take 50 mg by mouth daily.    Marland Kitchen omeprazole (PRILOSEC) 40 MG capsule Take 1 capsule (40 mg total) by mouth daily. 30 capsule 2  . ondansetron (ZOFRAN) 4 MG tablet Take 1-2 tablets (4-8 mg total) by mouth every 8 (eight) hours as needed for nausea (not responsive to prochlorperazine (COMPAZINE)). 20 tablet 0  . oxyCODONE-acetaminophen (ROXICET) 5-325 MG per tablet Take 1 tablet by mouth every 4 (four) hours as needed for severe pain. 60 tablet 0  . pravastatin (PRAVACHOL) 80 MG tablet Take 80 mg by mouth daily.    . predniSONE (DELTASONE) 20 MG tablet Take 4.5 tablets (90mg ) daily on days 1 - 5 of chemotherapy 25 tablet 6  . senna-docusate (SENOKOT-S) 8.6-50 MG per tablet Take 1 tablet by mouth at bedtime as needed for mild constipation. 30 tablet 2   No current facility-administered medications for this visit.    OBJECTIVE: Filed Vitals:   05/24/15 0921  BP: 144/81  Pulse: 101  Temp: 97.1 F (36.2 C)  Resp: 18     Body mass index  is 28.44 kg/(m^2).    ECOG FS:1 - Symptomatic but completely ambulatory  General: Well-developed, well-nourished, no acute distress. Eyes: Pink conjunctiva, anicteric sclera. Lungs: Clear to auscultation bilaterally. Heart: Regular rate and rhythm. No rubs, murmurs, or gallops. Abdomen: Soft, nontender, nondistended. No organomegaly noted, normoactive bowel sounds. Musculoskeletal: No edema, cyanosis, or clubbing. Neuro: Alert, answering all questions appropriately. Cranial nerves grossly intact. Skin: No rashes or petechiae noted. Psych: Normal affect. Lymphatics:  Easily palpable axillary lymphadenopathy.   LAB RESULTS:  Lab Results  Component Value Date   NA 137 05/24/2015   K 3.9 05/24/2015   CL 101 05/24/2015   CO2 27 05/24/2015   GLUCOSE 144* 05/24/2015   BUN 13 05/24/2015   CREATININE 0.81 05/24/2015   CALCIUM 9.1 05/24/2015   PROT 7.1 05/24/2015   ALBUMIN 3.9 05/24/2015   AST 18 05/24/2015   ALT 22 05/24/2015   ALKPHOS 131* 05/24/2015   BILITOT 0.3 05/24/2015   GFRNONAA >60 05/24/2015   GFRAA >60 05/24/2015    Lab Results  Component Value Date   WBC 18.4* 05/24/2015   NEUTROABS 14.6* 05/24/2015   HGB 12.4* 05/24/2015   HCT 37.7* 05/24/2015   MCV 89.3 05/24/2015   PLT 411 05/24/2015     STUDIES: Nm Cardiac Muga Rest  05/01/2015   CLINICAL DATA:  Peripheral T-cell lymphoma. Cardiac evaluation for chemotherapy.  EXAM: NUCLEAR MEDICINE CARDIAC BLOOD POOL IMAGING (MUGA)  TECHNIQUE: Cardiac multi-gated acquisition was performed at rest following intravenous injection of Tc-34m labeled red blood cells.  RADIOPHARMACEUTICALS:  22.1 mCi Tc-61m MDP in-vitro labeled red blood cells IV  COMPARISON:  10/13/2012.  FINDINGS: No  focal wall motion abnormality of the left ventricle.  Calculated left ventricular ejection fraction equals 58%. This compares to 53% on 10/13/2012.  IMPRESSION: Left ventricular ejection fraction equals58 %.   Electronically Signed   By: Suzy Bouchard M.D.   On: 05/01/2015 14:26   Nm Pet Image Initial (pi) Skull Base To Thigh  04/28/2015   CLINICAL DATA:  Subsequent Treatment strategy for lymphoma.  EXAM: NUCLEAR MEDICINE PET SKULL BASE TO THIGH  TECHNIQUE: 13.4 mCi F-18 FDG was injected intravenously. Full-ring PET imaging was performed from the skull base to thigh after the radiotracer. CT data was obtained and used for attenuation correction and anatomic localization.  FASTING BLOOD GLUCOSE:  Value: 126 mg/dl  COMPARISON:  12/22/2012  FINDINGS: NECK  No hypermetabolic lymph nodes in the neck.  CHEST  There are  enlarged and hypermetabolic bilateral axillary lymph nodes identified. Index right axillary node measures 2.3 cm, image 87/series 3. This has an SUV max equal to 3.79. Left axillary lymph node measures 1.5cm, image 77/series 3. This has an SUV max equal to 3.79. Hypermetabolic mediastinal and bilateral hilar lymph nodes are identified. Index right paratracheal lymph node measure 1.3 cm, image 94/series 3. This has an SUV max equal to 4.0. Hypermetabolic pre-vascular lymph node measures 1.2 cm, image 94/series 3. The SUV max associated with this node is equal to 4.14. Right hilar node measures 1.6 cm, image 102/series 3. The SUV max associated with this node is equal to 4.55.  No pleural effusion identified. No hypermetabolic pulmonary nodule or mass noted.  ABDOMEN/PELVIS  Borderline enlarged upper abdominal lymph nodes are identified exhibiting mild increased uptake. Index gastrohepatic ligament node measures 1 cm, image 146/series 3. The SUV max is equal to 3.8. Index aortocaval lymph node measure 1 cm. The SUV max is equal to 3.3, image 199/series 3. Enlarged and hypermetabolic  iliac and bilateral inguinal lymph nodes are identified. Index right external iliac node measures 1.4 cm and has an SUV max equal to 3.88, image 245/series 3. Index right inguinal node measure 2 cm and has an SUV max equal 2.8, image 271/series 3. Left inguinal node measures 1.3 cm and has an SUV max equal to 3.1, image number 271/series 3.  No abnormal radiotracer activity within the liver, pancreas, spleen, or adrenal glands.  SKELETON  There is mild diffuse increased radiotracer uptake throughout the axial and appendicular skeleton.  IMPRESSION: 1. Hypermetabolic axillary, mediastinal, hilar, retroperitoneal and pelvic adenopathy identified consistent with recurrent lymphoma.   Electronically Signed   By: Kerby Moors M.D.   On: 04/28/2015 12:19   Dg Chest Port 1 View  04/26/2015   CLINICAL DATA:  Lymphoma.  Syncope.  EXAM:  PORTABLE CHEST - 1 VIEW  COMPARISON:  CT chest 04/10/2015  FINDINGS: Cardiac enlargement. Mild anterior mediastinal adenopathy. Right peritracheal soft tissue density corresponding to vascular structures on CT. Mild hilar adenopathy best seen on CT. Port-A-Cath tip in the SVC  Negative for pneumonia or heart failure. Mild bibasilar atelectasis.  IMPRESSION: Mild adenopathy.  Mild bibasilar atelectasis.   Electronically Signed   By: Franchot Gallo M.D.   On: 04/26/2015 11:44    ASSESSMENT: Recurrent stage IV T-cell lymphoma.  PLAN:    1. T-cell lymphoma: Imaging and pathology results reviewed independently confirming recurrent disease. Previously, his disease with CD30 positive, it is now CD30 negative. Proceed with cycle 2 of CHOP plus etoposide today. Will have to monitor Adriamycin dose as patient is likely nearing his lifetime dose. Continue with Onpro Neulasta on day 3 of treatment. Return to clinic in 1 and 2 days for etoposide only, in 1 week for laboratory work, and then in 3 weeks for consideration of cycle 3. Plan to reimage at the conclusion of cycle 4. 2. Leukocytosis: Secondary to OnPro Neulasta as above.  Patient expressed understanding and was in agreement with this plan. He also understands that He can call clinic at any time with any questions, concerns, or complaints.   Lloyd Huger, MD   05/26/2015 8:54 AM

## 2015-05-31 ENCOUNTER — Inpatient Hospital Stay: Payer: Medicare Other

## 2015-05-31 DIAGNOSIS — C8442 Peripheral T-cell lymphoma, not classified, intrathoracic lymph nodes: Secondary | ICD-10-CM

## 2015-05-31 DIAGNOSIS — Z5111 Encounter for antineoplastic chemotherapy: Secondary | ICD-10-CM | POA: Diagnosis not present

## 2015-05-31 LAB — COMPREHENSIVE METABOLIC PANEL
ALBUMIN: 3.9 g/dL (ref 3.5–5.0)
ALK PHOS: 102 U/L (ref 38–126)
ALT: 30 U/L (ref 17–63)
AST: 21 U/L (ref 15–41)
Anion gap: 11 (ref 5–15)
BUN: 24 mg/dL — AB (ref 6–20)
CALCIUM: 9.2 mg/dL (ref 8.9–10.3)
CHLORIDE: 100 mmol/L — AB (ref 101–111)
CO2: 26 mmol/L (ref 22–32)
CREATININE: 0.8 mg/dL (ref 0.61–1.24)
GFR calc non Af Amer: >60 mL/min (ref >60)
GLUCOSE: 193 mg/dL — AB (ref 65–99)
Potassium: 3.8 mmol/L (ref 3.5–5.1)
SODIUM: 137 mmol/L (ref 135–145)
Total Bilirubin: 0.8 mg/dL (ref 0.3–1.2)
Total Protein: 6.4 g/dL — ABNORMAL LOW (ref 6.5–8.1)

## 2015-05-31 LAB — CBC WITH DIFFERENTIAL/PLATELET
Basophils Absolute: 0 10*3/uL (ref 0–0.1)
Basophils Relative: 1 %
EOS ABS: 0.3 10*3/uL (ref 0–0.7)
Eosinophils Relative: 8 %
HCT: 35.5 % — ABNORMAL LOW (ref 40.0–52.0)
HEMOGLOBIN: 11.5 g/dL — AB (ref 13.0–18.0)
LYMPHS ABS: 1 10*3/uL (ref 1.0–3.6)
Lymphocytes Relative: 27 %
MCH: 29.2 pg (ref 26.0–34.0)
MCHC: 32.5 g/dL (ref 32.0–36.0)
MCV: 89.7 fL (ref 80.0–100.0)
MONO ABS: 0 10*3/uL — AB (ref 0.2–1.0)
MONOS PCT: 1 %
NEUTROS PCT: 63 %
Neutro Abs: 2.3 10*3/uL (ref 1.4–6.5)
Platelets: 306 10*3/uL (ref 150–440)
RBC: 3.95 MIL/uL — ABNORMAL LOW (ref 4.40–5.90)
RDW: 14.6 % — AB (ref 11.5–14.5)
WBC: 3.6 10*3/uL — ABNORMAL LOW (ref 3.8–10.6)

## 2015-06-14 ENCOUNTER — Inpatient Hospital Stay (HOSPITAL_BASED_OUTPATIENT_CLINIC_OR_DEPARTMENT_OTHER): Payer: Medicare Other | Admitting: Oncology

## 2015-06-14 ENCOUNTER — Inpatient Hospital Stay: Payer: Medicare Other

## 2015-06-14 VITALS — BP 131/84 | HR 105 | Temp 97.4°F | Resp 16 | Wt 192.5 lb

## 2015-06-14 DIAGNOSIS — Z87891 Personal history of nicotine dependence: Secondary | ICD-10-CM

## 2015-06-14 DIAGNOSIS — R531 Weakness: Secondary | ICD-10-CM

## 2015-06-14 DIAGNOSIS — C8448 Peripheral T-cell lymphoma, not classified, lymph nodes of multiple sites: Secondary | ICD-10-CM

## 2015-06-14 DIAGNOSIS — R5383 Other fatigue: Secondary | ICD-10-CM

## 2015-06-14 DIAGNOSIS — E780 Pure hypercholesterolemia: Secondary | ICD-10-CM

## 2015-06-14 DIAGNOSIS — Z79899 Other long term (current) drug therapy: Secondary | ICD-10-CM

## 2015-06-14 DIAGNOSIS — D72829 Elevated white blood cell count, unspecified: Secondary | ICD-10-CM

## 2015-06-14 DIAGNOSIS — Z7982 Long term (current) use of aspirin: Secondary | ICD-10-CM

## 2015-06-14 DIAGNOSIS — Z8673 Personal history of transient ischemic attack (TIA), and cerebral infarction without residual deficits: Secondary | ICD-10-CM

## 2015-06-14 DIAGNOSIS — R5381 Other malaise: Secondary | ICD-10-CM

## 2015-06-14 DIAGNOSIS — I10 Essential (primary) hypertension: Secondary | ICD-10-CM

## 2015-06-14 DIAGNOSIS — C8442 Peripheral T-cell lymphoma, not classified, intrathoracic lymph nodes: Secondary | ICD-10-CM

## 2015-06-14 DIAGNOSIS — Z5111 Encounter for antineoplastic chemotherapy: Secondary | ICD-10-CM | POA: Diagnosis not present

## 2015-06-14 LAB — COMPREHENSIVE METABOLIC PANEL
ALT: 21 U/L (ref 17–63)
AST: 19 U/L (ref 15–41)
Albumin: 3.4 g/dL — ABNORMAL LOW (ref 3.5–5.0)
Alkaline Phosphatase: 120 U/L (ref 38–126)
Anion gap: 8 (ref 5–15)
BILIRUBIN TOTAL: 0.3 mg/dL (ref 0.3–1.2)
BUN: 13 mg/dL (ref 6–20)
CALCIUM: 8.7 mg/dL — AB (ref 8.9–10.3)
CHLORIDE: 103 mmol/L (ref 101–111)
CO2: 26 mmol/L (ref 22–32)
CREATININE: 0.81 mg/dL (ref 0.61–1.24)
Glucose, Bld: 192 mg/dL — ABNORMAL HIGH (ref 65–99)
Potassium: 4 mmol/L (ref 3.5–5.1)
Sodium: 137 mmol/L (ref 135–145)
TOTAL PROTEIN: 6.8 g/dL (ref 6.5–8.1)

## 2015-06-14 LAB — CBC WITH DIFFERENTIAL/PLATELET
BASOS ABS: 0.1 10*3/uL (ref 0–0.1)
BASOS PCT: 0 %
EOS ABS: 0.2 10*3/uL (ref 0–0.7)
EOS PCT: 1 %
HCT: 34.1 % — ABNORMAL LOW (ref 40.0–52.0)
HEMOGLOBIN: 11.1 g/dL — AB (ref 13.0–18.0)
LYMPHS ABS: 2 10*3/uL (ref 1.0–3.6)
Lymphocytes Relative: 12 %
MCH: 28.5 pg (ref 26.0–34.0)
MCHC: 32.5 g/dL (ref 32.0–36.0)
MCV: 87.6 fL (ref 80.0–100.0)
Monocytes Absolute: 1.2 10*3/uL — ABNORMAL HIGH (ref 0.2–1.0)
Monocytes Relative: 7 %
NEUTROS PCT: 80 %
Neutro Abs: 13.2 10*3/uL — ABNORMAL HIGH (ref 1.4–6.5)
PLATELETS: 549 10*3/uL — AB (ref 150–440)
RBC: 3.89 MIL/uL — AB (ref 4.40–5.90)
RDW: 15.8 % — ABNORMAL HIGH (ref 11.5–14.5)
WBC: 16.6 10*3/uL — AB (ref 3.8–10.6)

## 2015-06-14 MED ORDER — SODIUM CHLORIDE 0.9 % IV SOLN: Freq: Once | INTRAVENOUS | Status: DC

## 2015-06-14 MED ORDER — SODIUM CHLORIDE 0.9 % IV SOLN
Freq: Once | INTRAVENOUS | Status: AC
  Administered 2015-06-14: 10:00:00 via INTRAVENOUS
  Filled 2015-06-14: qty 1000

## 2015-06-14 MED ORDER — SODIUM CHLORIDE 0.9 % IV SOLN
Freq: Once | INTRAVENOUS | Status: AC
  Administered 2015-06-14: 10:00:00 via INTRAVENOUS
  Filled 2015-06-14: qty 8

## 2015-06-14 MED ORDER — SODIUM CHLORIDE 0.9 % IV SOLN
2.0000 mg | Freq: Once | INTRAVENOUS | Status: AC
  Administered 2015-06-14: 2 mg via INTRAVENOUS
  Filled 2015-06-14: qty 2

## 2015-06-14 MED ORDER — SODIUM CHLORIDE 0.9 % IV SOLN
80.0000 mg/m2 | Freq: Once | INTRAVENOUS | Status: AC
  Administered 2015-06-14: 170 mg via INTRAVENOUS
  Filled 2015-06-14: qty 8.5

## 2015-06-14 MED ORDER — DOXORUBICIN HCL CHEMO IV INJECTION 2 MG/ML
50.0000 mg/m2 | Freq: Once | INTRAVENOUS | Status: AC
  Administered 2015-06-14: 108 mg via INTRAVENOUS
  Filled 2015-06-14: qty 54

## 2015-06-14 MED ORDER — HEPARIN SOD (PORK) LOCK FLUSH 100 UNIT/ML IV SOLN
500.0000 [IU] | Freq: Once | INTRAVENOUS | Status: AC | PRN
  Administered 2015-06-14: 500 [IU]

## 2015-06-14 MED ORDER — SODIUM CHLORIDE 0.9 % IJ SOLN
10.0000 mL | INTRAMUSCULAR | Status: DC | PRN
  Filled 2015-06-14: qty 10

## 2015-06-14 MED ORDER — SODIUM CHLORIDE 0.9 % IV SOLN: 80.0000 mg/m2 | Freq: Once | INTRAVENOUS | Status: DC

## 2015-06-14 MED ORDER — SODIUM CHLORIDE 0.9 % IV SOLN
750.0000 mg/m2 | Freq: Once | INTRAVENOUS | Status: AC
  Administered 2015-06-14: 1620 mg via INTRAVENOUS
  Filled 2015-06-14: qty 81

## 2015-06-14 NOTE — Progress Notes (Signed)
Patient still has occasional night sweats.  Also episodes of leg weakness.

## 2015-06-15 ENCOUNTER — Inpatient Hospital Stay: Payer: Medicare Other

## 2015-06-16 ENCOUNTER — Inpatient Hospital Stay: Payer: Medicare Other

## 2015-06-16 VITALS — BP 134/81 | HR 97 | Temp 96.2°F | Resp 18

## 2015-06-16 DIAGNOSIS — Z5111 Encounter for antineoplastic chemotherapy: Secondary | ICD-10-CM | POA: Diagnosis not present

## 2015-06-16 DIAGNOSIS — C8448 Peripheral T-cell lymphoma, not classified, lymph nodes of multiple sites: Secondary | ICD-10-CM

## 2015-06-16 MED ORDER — SODIUM CHLORIDE 0.9 % IV SOLN
INTRAVENOUS | Status: DC
  Administered 2015-06-16: 14:00:00 via INTRAVENOUS
  Filled 2015-06-16: qty 1000

## 2015-06-16 MED ORDER — SODIUM CHLORIDE 0.9 % IV SOLN
80.0000 mg/m2 | Freq: Once | INTRAVENOUS | Status: AC
  Administered 2015-06-16: 170 mg via INTRAVENOUS
  Filled 2015-06-16: qty 8.5

## 2015-06-16 MED ORDER — HEPARIN SOD (PORK) LOCK FLUSH 100 UNIT/ML IV SOLN
500.0000 [IU] | Freq: Once | INTRAVENOUS | Status: AC
  Administered 2015-06-16: 500 [IU] via INTRAVENOUS
  Filled 2015-06-16: qty 5

## 2015-06-16 MED ORDER — SODIUM CHLORIDE 0.9 % IV SOLN
Freq: Once | INTRAVENOUS | Status: AC
  Administered 2015-06-16: 14:00:00 via INTRAVENOUS
  Filled 2015-06-16: qty 4

## 2015-06-16 MED ORDER — SODIUM CHLORIDE 0.9 % IJ SOLN
10.0000 mL | Freq: Once | INTRAMUSCULAR | Status: AC
  Administered 2015-06-16: 10 mL via INTRAVENOUS
  Filled 2015-06-16: qty 10

## 2015-06-19 ENCOUNTER — Inpatient Hospital Stay: Payer: Medicare Other | Attending: Oncology

## 2015-06-19 VITALS — BP 132/78 | HR 92 | Temp 96.9°F | Resp 18

## 2015-06-19 DIAGNOSIS — Z87891 Personal history of nicotine dependence: Secondary | ICD-10-CM | POA: Insufficient documentation

## 2015-06-19 DIAGNOSIS — I10 Essential (primary) hypertension: Secondary | ICD-10-CM | POA: Diagnosis not present

## 2015-06-19 DIAGNOSIS — Z418 Encounter for other procedures for purposes other than remedying health state: Secondary | ICD-10-CM | POA: Diagnosis not present

## 2015-06-19 DIAGNOSIS — E7800 Pure hypercholesterolemia, unspecified: Secondary | ICD-10-CM | POA: Insufficient documentation

## 2015-06-19 DIAGNOSIS — Z8673 Personal history of transient ischemic attack (TIA), and cerebral infarction without residual deficits: Secondary | ICD-10-CM | POA: Insufficient documentation

## 2015-06-19 DIAGNOSIS — Z5111 Encounter for antineoplastic chemotherapy: Secondary | ICD-10-CM | POA: Diagnosis present

## 2015-06-19 DIAGNOSIS — C8448 Peripheral T-cell lymphoma, not classified, lymph nodes of multiple sites: Secondary | ICD-10-CM | POA: Diagnosis not present

## 2015-06-19 DIAGNOSIS — Z79899 Other long term (current) drug therapy: Secondary | ICD-10-CM | POA: Diagnosis not present

## 2015-06-19 DIAGNOSIS — R531 Weakness: Secondary | ICD-10-CM | POA: Diagnosis not present

## 2015-06-19 DIAGNOSIS — R5381 Other malaise: Secondary | ICD-10-CM | POA: Insufficient documentation

## 2015-06-19 DIAGNOSIS — D72829 Elevated white blood cell count, unspecified: Secondary | ICD-10-CM | POA: Diagnosis not present

## 2015-06-19 DIAGNOSIS — R5383 Other fatigue: Secondary | ICD-10-CM | POA: Diagnosis not present

## 2015-06-19 DIAGNOSIS — Z7982 Long term (current) use of aspirin: Secondary | ICD-10-CM | POA: Insufficient documentation

## 2015-06-19 MED ORDER — DEXAMETHASONE SODIUM PHOSPHATE 100 MG/10ML IJ SOLN
Freq: Once | INTRAMUSCULAR | Status: AC
  Administered 2015-06-19: 15:00:00 via INTRAVENOUS
  Filled 2015-06-19: qty 4

## 2015-06-19 MED ORDER — SODIUM CHLORIDE 0.9 % IV SOLN
80.0000 mg/m2 | Freq: Once | INTRAVENOUS | Status: AC
  Administered 2015-06-19: 170 mg via INTRAVENOUS
  Filled 2015-06-19: qty 8.5

## 2015-06-19 MED ORDER — HEPARIN SOD (PORK) LOCK FLUSH 100 UNIT/ML IV SOLN
500.0000 [IU] | Freq: Once | INTRAVENOUS | Status: AC
  Administered 2015-06-19: 500 [IU] via INTRAVENOUS
  Filled 2015-06-19: qty 5

## 2015-06-19 MED ORDER — SODIUM CHLORIDE 0.9 % IV SOLN
INTRAVENOUS | Status: DC
  Administered 2015-06-19: 14:00:00 via INTRAVENOUS
  Filled 2015-06-19: qty 1000

## 2015-06-19 MED ORDER — SODIUM CHLORIDE 0.9 % IJ SOLN
10.0000 mL | Freq: Once | INTRAMUSCULAR | Status: AC
  Administered 2015-06-19: 10 mL via INTRAVENOUS
  Filled 2015-06-19: qty 10

## 2015-06-19 MED ORDER — PEGFILGRASTIM 6 MG/0.6ML ~~LOC~~ PSKT
6.0000 mg | PREFILLED_SYRINGE | Freq: Once | SUBCUTANEOUS | Status: AC
  Administered 2015-06-19: 6 mg via SUBCUTANEOUS
  Filled 2015-06-19: qty 0.6

## 2015-06-25 NOTE — Progress Notes (Signed)
Erik Shelton  Telephone:(336) [phone] Fax:(336) [phone]  ID: Velvet Bathe OB: [birth year]  MR#: [id number]  JJK#:[id number]  Patient Care Team: [name], MD as PCP - General (Internal Medicine)  CHIEF COMPLAINT:  Chief Complaint  Patient presents with  . Lymphoma    INTERVAL HISTORY: Patient returns to clinic today for further evaluation and consideration of cycle 3 of etoposide plus CHOP. He feels significantly improved and nearly back to his baseline. He has some mild weakness and fatigue after treatments, but otherwise is tolerating them well. Complain of pain today. His nausea and appetite have improved. He has no chest pain or shortness of breath. He denies any vomiting, constipation, or diarrhea. Patient offers no further specific complaints today.  REVIEW OF SYSTEMS:   Review of Systems  Constitutional: Positive for malaise/fatigue. Negative for fever.  Respiratory: Negative.   Cardiovascular: Negative.   Gastrointestinal: Negative for nausea and abdominal pain.  Musculoskeletal: Negative.   Neurological: Positive for weakness.    As per HPI. Otherwise, a complete review of systems is negatve.  PAST MEDICAL HISTORY: Past Medical History  Diagnosis Date  . Stroke   . Seizures   . Hypercholesteremia   . HTN (hypertension)   . Cancer     T-Cell Lymphoma  . Seizures     PAST SURGICAL HISTORY: Past Surgical History  Procedure Laterality Date  . Peripheral vascular catheterization N/A 04/24/2015    Procedure: Glori Luis Cath Insertion;  Surgeon: Algernon Huxley, MD;  Location: [facility];  Service: Cardiovascular;  Laterality: N/A;    FAMILY HISTORY: Reviewed and unchanged. No reported history of malignancy or chronic disease.     ADVANCED DIRECTIVES:    HEALTH MAINTENANCE: Social History  Substance Use Topics  . Smoking status: Former Research scientist (life sciences)  . Smokeless tobacco: Not on file     Comment: Quit  in 1990  . Alcohol Use: Yes    Comment: Occassional glass of wine     Colonoscopy:  PAP:  Bone density:  Lipid panel:  Allergies  Allergen Reactions  . No Known Allergies     Current Outpatient Prescriptions  Medication Sig Dispense Refill  . acetaminophen (TYLENOL) 325 MG tablet Take 650 mg by mouth every 6 (six) hours as needed for mild pain, moderate pain, fever or headache.    Marland Kitchen aspirin 81 MG tablet Take 81 mg by mouth daily.    Marland Kitchen lidocaine-prilocaine (EMLA) cream Apply 1 application topically as needed. 30 g 2  . losartan (COZAAR) 50 MG tablet Take 50 mg by mouth daily.    Marland Kitchen omeprazole (PRILOSEC) 40 MG capsule Take 1 capsule (40 mg total) by mouth daily. 30 capsule 2  . ondansetron (ZOFRAN) 4 MG tablet Take 1-2 tablets (4-8 mg total) by mouth every 8 (eight) hours as needed for nausea (not responsive to prochlorperazine (COMPAZINE)). 20 tablet 0  . oxyCODONE-acetaminophen (ROXICET) 5-325 MG per tablet Take 1 tablet by mouth every 4 (four) hours as needed for severe pain. 60 tablet 0  . pravastatin (PRAVACHOL) 80 MG tablet Take 80 mg by mouth daily.    . predniSONE (DELTASONE) 20 MG tablet Take 4.5 tablets (90mg ) daily on days 1 - 5 of chemotherapy 25 tablet 6  . senna-docusate (SENOKOT-S) 8.6-50 MG per tablet Take 1 tablet by mouth at bedtime as needed for mild constipation. 30 tablet 2   No current facility-administered medications for this visit.    OBJECTIVE: Filed Vitals:   06/14/15 0912  BP: 131/84  Pulse: 105  Temp: 97.4 F (36.3 C)  Resp: 16     Body mass index is 28.51 kg/(m^2).    ECOG FS:1 - Symptomatic but completely ambulatory  General: Well-developed, well-nourished, no acute distress. Eyes: Pink conjunctiva, anicteric sclera. Lungs: Clear to auscultation bilaterally. Heart: Regular rate and rhythm. No rubs, murmurs, or gallops. Abdomen: Soft, nontender, nondistended. No organomegaly noted, normoactive bowel sounds. Musculoskeletal: No edema, cyanosis, or clubbing. Neuro: Alert,  answering all questions appropriately. Cranial nerves grossly intact. Skin: No rashes or petechiae noted. Psych: Normal affect. Lymphatics: Easily palpable axillary lymphadenopathy,improving.   LAB RESULTS:  Lab Results  Component Value Date   NA 137 06/14/2015   K 4.0 06/14/2015   CL 103 06/14/2015   CO2 26 06/14/2015   GLUCOSE 192* 06/14/2015   BUN 13 06/14/2015   CREATININE 0.81 06/14/2015   CALCIUM 8.7* 06/14/2015   PROT 6.8 06/14/2015   ALBUMIN 3.4* 06/14/2015   AST 19 06/14/2015   ALT 21 06/14/2015   ALKPHOS 120 06/14/2015   BILITOT 0.3 06/14/2015   GFRNONAA >60 06/14/2015   GFRAA >60 06/14/2015    Lab Results  Component Value Date   WBC 16.6* 06/14/2015   NEUTROABS 13.2* 06/14/2015   HGB 11.1* 06/14/2015   HCT 34.1* 06/14/2015   MCV 87.6 06/14/2015   PLT 549* 06/14/2015     STUDIES: No results found.  ASSESSMENT: Recurrent stage IV T-cell lymphoma.  PLAN:    1. T-cell lymphoma: Imaging and pathology results reviewed independently confirming recurrent disease. Previously, his disease with CD30 positive, it is now CD30 negative. Proceed with cycle 3 of CHOP plus etoposide today. Will have to monitor Adriamycin dose as patient is likely nearing his lifetime dose. Continue with Onpro Neulasta on day 3 of treatment. Return to clinic in 1 and 2 days for etoposide only, in 1 week for laboratory work, and then in 3 weeks for consideration of cycle 4. Plan to reimage at the conclusion of cycle 4. 2. Leukocytosis: Secondary to OnPro Neulasta as above.  Patient expressed understanding and was in agreement with this plan. He also understands that He can call clinic at any time with any questions, concerns, or complaints.   Lloyd Huger, MD   06/25/2015 10:34 AM

## 2015-07-05 ENCOUNTER — Inpatient Hospital Stay (HOSPITAL_BASED_OUTPATIENT_CLINIC_OR_DEPARTMENT_OTHER): Payer: Medicare Other | Admitting: Oncology

## 2015-07-05 ENCOUNTER — Inpatient Hospital Stay: Payer: Medicare Other

## 2015-07-05 VITALS — BP 124/77 | HR 103 | Temp 97.4°F | Resp 18 | Wt 191.8 lb

## 2015-07-05 DIAGNOSIS — R531 Weakness: Secondary | ICD-10-CM | POA: Diagnosis not present

## 2015-07-05 DIAGNOSIS — Z87891 Personal history of nicotine dependence: Secondary | ICD-10-CM

## 2015-07-05 DIAGNOSIS — Z7982 Long term (current) use of aspirin: Secondary | ICD-10-CM

## 2015-07-05 DIAGNOSIS — Z8673 Personal history of transient ischemic attack (TIA), and cerebral infarction without residual deficits: Secondary | ICD-10-CM

## 2015-07-05 DIAGNOSIS — Z79899 Other long term (current) drug therapy: Secondary | ICD-10-CM

## 2015-07-05 DIAGNOSIS — Z5111 Encounter for antineoplastic chemotherapy: Secondary | ICD-10-CM | POA: Diagnosis not present

## 2015-07-05 DIAGNOSIS — E7800 Pure hypercholesterolemia, unspecified: Secondary | ICD-10-CM

## 2015-07-05 DIAGNOSIS — C8448 Peripheral T-cell lymphoma, not classified, lymph nodes of multiple sites: Secondary | ICD-10-CM

## 2015-07-05 DIAGNOSIS — R5383 Other fatigue: Secondary | ICD-10-CM | POA: Diagnosis not present

## 2015-07-05 DIAGNOSIS — I10 Essential (primary) hypertension: Secondary | ICD-10-CM

## 2015-07-05 DIAGNOSIS — D72829 Elevated white blood cell count, unspecified: Secondary | ICD-10-CM | POA: Diagnosis not present

## 2015-07-05 DIAGNOSIS — C8442 Peripheral T-cell lymphoma, not classified, intrathoracic lymph nodes: Secondary | ICD-10-CM

## 2015-07-05 DIAGNOSIS — R5381 Other malaise: Secondary | ICD-10-CM

## 2015-07-05 LAB — COMPREHENSIVE METABOLIC PANEL
ALT: 16 U/L — AB (ref 17–63)
AST: 18 U/L (ref 15–41)
Albumin: 3.7 g/dL (ref 3.5–5.0)
Alkaline Phosphatase: 123 U/L (ref 38–126)
Anion gap: 6 (ref 5–15)
BUN: 14 mg/dL (ref 6–20)
CHLORIDE: 102 mmol/L (ref 101–111)
CO2: 27 mmol/L (ref 22–32)
CREATININE: 0.72 mg/dL (ref 0.61–1.24)
Calcium: 8.8 mg/dL — ABNORMAL LOW (ref 8.9–10.3)
GFR calc Af Amer: >60 mL/min (ref >60)
Glucose, Bld: 132 mg/dL — ABNORMAL HIGH (ref 65–99)
Potassium: 4 mmol/L (ref 3.5–5.1)
Sodium: 135 mmol/L (ref 135–145)
Total Bilirubin: 0.4 mg/dL (ref 0.3–1.2)
Total Protein: 7 g/dL (ref 6.5–8.1)

## 2015-07-05 LAB — CBC WITH DIFFERENTIAL/PLATELET
Basophils Absolute: 0 10*3/uL (ref 0–0.1)
Basophils Relative: 0 %
EOS ABS: 0.1 10*3/uL (ref 0–0.7)
EOS PCT: 0 %
HCT: 32.2 % — ABNORMAL LOW (ref 40.0–52.0)
Hemoglobin: 10.5 g/dL — ABNORMAL LOW (ref 13.0–18.0)
LYMPHS ABS: 1.9 10*3/uL (ref 1.0–3.6)
Lymphocytes Relative: 8 %
MCH: 28.5 pg (ref 26.0–34.0)
MCHC: 32.6 g/dL (ref 32.0–36.0)
MCV: 87.4 fL (ref 80.0–100.0)
Monocytes Absolute: 1.6 10*3/uL — ABNORMAL HIGH (ref 0.2–1.0)
Monocytes Relative: 7 %
Neutro Abs: 19.7 10*3/uL — ABNORMAL HIGH (ref 1.4–6.5)
Neutrophils Relative %: 85 %
PLATELETS: 415 10*3/uL (ref 150–440)
RBC: 3.69 MIL/uL — ABNORMAL LOW (ref 4.40–5.90)
RDW: 17.3 % — AB (ref 11.5–14.5)
WBC: 23.3 10*3/uL — AB (ref 4.0–10.5)

## 2015-07-05 MED ORDER — SODIUM CHLORIDE 0.9 % IJ SOLN
10.0000 mL | INTRAMUSCULAR | Status: DC | PRN
  Administered 2015-07-05: 10 mL
  Filled 2015-07-05: qty 10

## 2015-07-05 MED ORDER — SODIUM CHLORIDE 0.9 % IV SOLN
Freq: Once | INTRAVENOUS | Status: AC
  Administered 2015-07-05: 11:00:00 via INTRAVENOUS
  Filled 2015-07-05: qty 8

## 2015-07-05 MED ORDER — HEPARIN SOD (PORK) LOCK FLUSH 100 UNIT/ML IV SOLN
500.0000 [IU] | Freq: Once | INTRAVENOUS | Status: AC | PRN
  Administered 2015-07-05: 500 [IU]
  Filled 2015-07-05: qty 5

## 2015-07-05 MED ORDER — SODIUM CHLORIDE 0.9 % IV SOLN
Freq: Once | INTRAVENOUS | Status: AC
  Administered 2015-07-05: 10:00:00 via INTRAVENOUS
  Filled 2015-07-05: qty 1000

## 2015-07-05 MED ORDER — CYCLOPHOSPHAMIDE CHEMO INJECTION 1 GM
750.0000 mg/m2 | Freq: Once | INTRAMUSCULAR | Status: AC
  Administered 2015-07-05: 1620 mg via INTRAVENOUS
  Filled 2015-07-05: qty 81

## 2015-07-05 MED ORDER — SODIUM CHLORIDE 0.9 % IV SOLN
80.0000 mg/m2 | Freq: Once | INTRAVENOUS | Status: AC
  Administered 2015-07-05: 170 mg via INTRAVENOUS
  Filled 2015-07-05: qty 8.5

## 2015-07-05 MED ORDER — VINCRISTINE SULFATE CHEMO INJECTION 1 MG/ML
2.0000 mg | Freq: Once | INTRAVENOUS | Status: AC
  Administered 2015-07-05: 2 mg via INTRAVENOUS
  Filled 2015-07-05: qty 2

## 2015-07-05 MED ORDER — DOXORUBICIN HCL CHEMO IV INJECTION 2 MG/ML
50.0000 mg/m2 | Freq: Once | INTRAVENOUS | Status: AC
  Administered 2015-07-05: 108 mg via INTRAVENOUS
  Filled 2015-07-05: qty 54

## 2015-07-05 NOTE — Progress Notes (Signed)
Patient feels fatigued and has episodes of leg weakness.

## 2015-07-06 ENCOUNTER — Inpatient Hospital Stay: Payer: Medicare Other

## 2015-07-06 VITALS — BP 114/71 | HR 90 | Temp 97.0°F | Resp 18

## 2015-07-06 DIAGNOSIS — C8448 Peripheral T-cell lymphoma, not classified, lymph nodes of multiple sites: Secondary | ICD-10-CM

## 2015-07-06 DIAGNOSIS — Z5111 Encounter for antineoplastic chemotherapy: Secondary | ICD-10-CM | POA: Diagnosis not present

## 2015-07-06 MED ORDER — SODIUM CHLORIDE 0.9 % IV SOLN
80.0000 mg/m2 | Freq: Once | INTRAVENOUS | Status: AC
  Administered 2015-07-06: 170 mg via INTRAVENOUS
  Filled 2015-07-06: qty 8.5

## 2015-07-06 MED ORDER — SODIUM CHLORIDE 0.9 % IV SOLN
Freq: Once | INTRAVENOUS | Status: AC
  Administered 2015-07-06: 14:00:00 via INTRAVENOUS
  Filled 2015-07-06: qty 4

## 2015-07-06 MED ORDER — SODIUM CHLORIDE 0.9 % IJ SOLN
10.0000 mL | Freq: Once | INTRAMUSCULAR | Status: AC
  Administered 2015-07-06: 10 mL via INTRAVENOUS
  Filled 2015-07-06: qty 10

## 2015-07-06 MED ORDER — SODIUM CHLORIDE 0.9 % IV SOLN
INTRAVENOUS | Status: DC
  Administered 2015-07-06: 14:00:00 via INTRAVENOUS
  Filled 2015-07-06: qty 1000

## 2015-07-06 MED ORDER — HEPARIN SOD (PORK) LOCK FLUSH 100 UNIT/ML IV SOLN
500.0000 [IU] | Freq: Once | INTRAVENOUS | Status: AC
  Administered 2015-07-06: 500 [IU] via INTRAVENOUS
  Filled 2015-07-06: qty 5

## 2015-07-07 ENCOUNTER — Inpatient Hospital Stay: Payer: Medicare Other

## 2015-07-07 DIAGNOSIS — Z5111 Encounter for antineoplastic chemotherapy: Secondary | ICD-10-CM | POA: Diagnosis not present

## 2015-07-07 DIAGNOSIS — C8448 Peripheral T-cell lymphoma, not classified, lymph nodes of multiple sites: Secondary | ICD-10-CM

## 2015-07-07 MED ORDER — SODIUM CHLORIDE 0.9 % IV SOLN
Freq: Once | INTRAVENOUS | Status: AC
  Administered 2015-07-07: 14:00:00 via INTRAVENOUS
  Filled 2015-07-07: qty 4

## 2015-07-07 MED ORDER — HEPARIN SOD (PORK) LOCK FLUSH 100 UNIT/ML IV SOLN
INTRAVENOUS | Status: AC
  Filled 2015-07-07: qty 5

## 2015-07-07 MED ORDER — HEPARIN SOD (PORK) LOCK FLUSH 100 UNIT/ML IV SOLN
500.0000 [IU] | Freq: Once | INTRAVENOUS | Status: AC
  Administered 2015-07-07: 500 [IU] via INTRAVENOUS

## 2015-07-07 MED ORDER — SODIUM CHLORIDE 0.9 % IV SOLN
80.0000 mg/m2 | Freq: Once | INTRAVENOUS | Status: AC
  Administered 2015-07-07: 170 mg via INTRAVENOUS
  Filled 2015-07-07: qty 8.5

## 2015-07-07 MED ORDER — PEGFILGRASTIM 6 MG/0.6ML ~~LOC~~ PSKT
6.0000 mg | PREFILLED_SYRINGE | Freq: Once | SUBCUTANEOUS | Status: AC
  Administered 2015-07-07: 6 mg via SUBCUTANEOUS
  Filled 2015-07-07: qty 0.6

## 2015-07-07 MED ORDER — SODIUM CHLORIDE 0.9 % IV SOLN
INTRAVENOUS | Status: DC
  Administered 2015-07-07: 13:00:00 via INTRAVENOUS
  Filled 2015-07-07: qty 1000

## 2015-07-18 NOTE — Progress Notes (Signed)
[facility]  [phone] [phone]) [phone]  ID: Velvet Bathe OB: 10/22/1940  MR#: [id number]  IWL#:[id number]  Patient Care Team: [name], MD as PCP - General (Internal Medicine)  CHIEF COMPLAINT:  Chief Complaint  Patient presents with  . Lymphoma    INTERVAL HISTORY: Patient returns to clinic today for further evaluation and consideration of cycle 4 of etoposide plus CHOP. He has mild weakness and fatigue, but otherwise feels well. He does not complain of pain today. His nausea and appetite have improved. He has no chest pain or shortness of breath. He denies any vomiting, constipation, or diarrhea. Patient offers no further specific complaints today.  REVIEW OF SYSTEMS:   Review of Systems  Constitutional: Positive for malaise/fatigue. Negative for fever.  Respiratory: Negative.   Cardiovascular: Negative.   Gastrointestinal: Negative for nausea and abdominal pain.  Musculoskeletal: Negative.   Neurological: Positive for weakness.    As per HPI. Otherwise, a complete review of systems is negatve.  PAST MEDICAL HISTORY: Past Medical History  Diagnosis Date  . Stroke   . Seizures   . Hypercholesteremia   . HTN (hypertension)   . Cancer     T-Cell Lymphoma  . Seizures     PAST SURGICAL HISTORY: Past Surgical History  Procedure Laterality Date  . Peripheral vascular catheterization N/A 04/24/2015    Procedure: Glori Luis Cath Insertion;  Surgeon: Algernon Huxley, MD;  Location: [facility];  Service: Cardiovascular;  Laterality: N/A;    FAMILY HISTORY: Reviewed and unchanged. No reported history of malignancy or chronic disease.     ADVANCED DIRECTIVES:    HEALTH MAINTENANCE: Social History  Substance Use Topics  . Smoking status: Former Research scientist (life sciences)  . Smokeless tobacco: Not on file     Comment: Quit  in 1990  . Alcohol Use: Yes     Comment: Occassional glass of wine     Colonoscopy:  PAP:  Bone density:  Lipid  panel:  Allergies  Allergen Reactions  . No Known Allergies     Current Outpatient Prescriptions  Medication Sig Dispense Refill  . acetaminophen (TYLENOL) 325 MG tablet Take 650 mg by mouth every 6 (six) hours as needed for mild pain, moderate pain, fever or headache.    Marland Kitchen aspirin 81 MG tablet Take 81 mg by mouth daily.    Marland Kitchen lidocaine-prilocaine (EMLA) cream Apply 1 application topically as needed. 30 g 2  . losartan (COZAAR) 50 MG tablet Take 50 mg by mouth daily.    Marland Kitchen omeprazole (PRILOSEC) 40 MG capsule Take 1 capsule (40 mg total) by mouth daily. 30 capsule 2  . ondansetron (ZOFRAN) 4 MG tablet Take 1-2 tablets (4-8 mg total) by mouth every 8 (eight) hours as needed for nausea (not responsive to prochlorperazine (COMPAZINE)). 20 tablet 0  . oxyCODONE-acetaminophen (ROXICET) 5-325 MG per tablet Take 1 tablet by mouth every 4 (four) hours as needed for severe pain. 60 tablet 0  . pravastatin (PRAVACHOL) 80 MG tablet Take 80 mg by mouth daily.    . predniSONE (DELTASONE) 20 MG tablet Take 4.5 tablets (90mg ) daily on days 1 - 5 of chemotherapy 25 tablet 6  . senna-docusate (SENOKOT-S) 8.6-50 MG per tablet Take 1 tablet by mouth at bedtime as needed for mild constipation. 30 tablet 2   No current facility-administered medications for this visit.   Facility-Administered Medications Ordered in Other Visits  Medication Dose Route Frequency Provider Last Rate Last Dose  . sodium chloride 0.9 %  injection 10 mL  10 mL Intracatheter PRN Lloyd Huger, MD   10 mL at 07/05/15 0900    OBJECTIVE: Filed Vitals:   07/05/15 1400  BP: 124/77  Pulse: 103  Temp:   Resp:      Body mass index is 28.41 kg/(m^2).    ECOG FS:1 - Symptomatic but completely ambulatory  General: Well-developed, well-nourished, no acute distress. Eyes: Pink conjunctiva, anicteric sclera. Lungs: Clear to auscultation bilaterally. Heart: Regular rate and rhythm. No rubs, murmurs, or gallops. Abdomen: Soft, nontender,  nondistended. No organomegaly noted, normoactive bowel sounds. Musculoskeletal: No edema, cyanosis, or clubbing. Neuro: Alert, answering all questions appropriately. Cranial nerves grossly intact. Skin: No rashes or petechiae noted. Psych: Normal affect. Lymphatics: No palpable lymphadenopathy.   LAB RESULTS:  Lab Results  Component Value Date   NA 135 07/05/2015   K 4.0 07/05/2015   CL 102 07/05/2015   CO2 27 07/05/2015   GLUCOSE 132* 07/05/2015   BUN 14 07/05/2015   CREATININE 0.72 07/05/2015   CALCIUM 8.8* 07/05/2015   PROT 7.0 07/05/2015   ALBUMIN 3.7 07/05/2015   AST 18 07/05/2015   ALT 16* 07/05/2015   ALKPHOS 123 07/05/2015   BILITOT 0.4 07/05/2015   GFRNONAA >60 07/05/2015   GFRAA >60 07/05/2015    Lab Results  Component Value Date   WBC 23.3* 07/05/2015   NEUTROABS 19.7* 07/05/2015   HGB 10.5* 07/05/2015   HCT 32.2* 07/05/2015   MCV 87.4 07/05/2015   PLT 415 07/05/2015     STUDIES: No results found.  ASSESSMENT: Recurrent stage IV T-cell lymphoma.  PLAN:    1. T-cell lymphoma: Imaging and pathology results reviewed independently confirming recurrent disease. Previously, his disease with CD30 positive, it is now CD30 negative. Proceed with cycle 4 of CHOP plus etoposide today. Will have to monitor Adriamycin dose as patient is likely nearing his lifetime dose. Continue with Onpro Neulasta on day 3 of treatment. Return to clinic in 1 and 2 days for etoposide only, in 1 week for laboratory work, and then in ~4 weeks for consideration of cycle 5. Will reimage with PET scan prior to next treatment.  2. Leukocytosis: Secondary to OnPro Neulasta as above.  Patient expressed understanding and was in agreement with this plan. He also understands that He can call clinic at any time with any questions, concerns, or complaints.   Lloyd Huger, MD   07/18/2015 11:24 AM

## 2015-07-24 ENCOUNTER — Encounter
Admission: RE | Admit: 2015-07-24 | Discharge: 2015-07-24 | Disposition: A | Discharge status: Home / Self Care | Payer: Medicare Other | Source: Ambulatory Visit | Attending: Oncology | Admitting: Oncology

## 2015-07-24 DIAGNOSIS — C8442 Peripheral T-cell lymphoma, not classified, intrathoracic lymph nodes: Secondary | ICD-10-CM | POA: Insufficient documentation

## 2015-07-24 LAB — GLUCOSE, CAPILLARY: GLUCOSE-CAPILLARY: 101 mg/dL — AB (ref 65–99)

## 2015-07-24 MED ORDER — FLUDEOXYGLUCOSE F - 18 (FDG) INJECTION
12.2600 | Freq: Once | INTRAVENOUS | Status: DC | PRN
  Administered 2015-07-24: 12.26 via INTRAVENOUS
  Filled 2015-07-24: qty 12.26

## 2015-07-26 ENCOUNTER — Inpatient Hospital Stay: Payer: Medicare Other | Attending: Oncology

## 2015-07-26 ENCOUNTER — Ambulatory Visit: Payer: Medicare Other | Admitting: Oncology

## 2015-07-26 ENCOUNTER — Inpatient Hospital Stay (HOSPITAL_BASED_OUTPATIENT_CLINIC_OR_DEPARTMENT_OTHER): Payer: Medicare Other | Admitting: Oncology

## 2015-07-26 ENCOUNTER — Ambulatory Visit: Payer: Medicare Other

## 2015-07-26 ENCOUNTER — Inpatient Hospital Stay: Payer: Medicare Other

## 2015-07-26 ENCOUNTER — Other Ambulatory Visit: Payer: Medicare Other

## 2015-07-26 VITALS — BP 127/78 | HR 97 | Temp 97.1°F | Resp 18 | Wt 192.2 lb

## 2015-07-26 DIAGNOSIS — Z8673 Personal history of transient ischemic attack (TIA), and cerebral infarction without residual deficits: Secondary | ICD-10-CM | POA: Diagnosis not present

## 2015-07-26 DIAGNOSIS — E7800 Pure hypercholesterolemia, unspecified: Secondary | ICD-10-CM

## 2015-07-26 DIAGNOSIS — C8448 Peripheral T-cell lymphoma, not classified, lymph nodes of multiple sites: Secondary | ICD-10-CM | POA: Insufficient documentation

## 2015-07-26 DIAGNOSIS — C8442 Peripheral T-cell lymphoma, not classified, intrathoracic lymph nodes: Secondary | ICD-10-CM

## 2015-07-26 DIAGNOSIS — D6481 Anemia due to antineoplastic chemotherapy: Secondary | ICD-10-CM | POA: Diagnosis not present

## 2015-07-26 DIAGNOSIS — I10 Essential (primary) hypertension: Secondary | ICD-10-CM | POA: Diagnosis not present

## 2015-07-26 DIAGNOSIS — Z5111 Encounter for antineoplastic chemotherapy: Secondary | ICD-10-CM | POA: Insufficient documentation

## 2015-07-26 DIAGNOSIS — D72829 Elevated white blood cell count, unspecified: Secondary | ICD-10-CM | POA: Insufficient documentation

## 2015-07-26 DIAGNOSIS — Z79899 Other long term (current) drug therapy: Secondary | ICD-10-CM

## 2015-07-26 DIAGNOSIS — T451X5S Adverse effect of antineoplastic and immunosuppressive drugs, sequela: Secondary | ICD-10-CM | POA: Insufficient documentation

## 2015-07-26 DIAGNOSIS — Z7982 Long term (current) use of aspirin: Secondary | ICD-10-CM | POA: Diagnosis not present

## 2015-07-26 DIAGNOSIS — Z418 Encounter for other procedures for purposes other than remedying health state: Secondary | ICD-10-CM

## 2015-07-26 DIAGNOSIS — Z9181 History of falling: Secondary | ICD-10-CM | POA: Insufficient documentation

## 2015-07-26 DIAGNOSIS — Z87891 Personal history of nicotine dependence: Secondary | ICD-10-CM | POA: Insufficient documentation

## 2015-07-26 LAB — COMPREHENSIVE METABOLIC PANEL
ALT: 19 U/L (ref 17–63)
AST: 22 U/L (ref 15–41)
Albumin: 3.8 g/dL (ref 3.5–5.0)
Alkaline Phosphatase: 98 U/L (ref 38–126)
Anion gap: 9 (ref 5–15)
BILIRUBIN TOTAL: 0.4 mg/dL (ref 0.3–1.2)
BUN: 17 mg/dL (ref 6–20)
CO2: 25 mmol/L (ref 22–32)
CREATININE: 0.85 mg/dL (ref 0.61–1.24)
Calcium: 9.1 mg/dL (ref 8.9–10.3)
Chloride: 101 mmol/L (ref 101–111)
GFR calc Af Amer: >60 mL/min (ref >60)
Glucose, Bld: 156 mg/dL — ABNORMAL HIGH (ref 65–99)
Potassium: 4.2 mmol/L (ref 3.5–5.1)
Sodium: 135 mmol/L (ref 135–145)
TOTAL PROTEIN: 6.6 g/dL (ref 6.5–8.1)

## 2015-07-26 LAB — CBC WITH DIFFERENTIAL/PLATELET
BASOS ABS: 0.1 10*3/uL (ref 0–0.1)
Basophils Relative: 0 %
EOS ABS: 0 10*3/uL (ref 0–0.7)
EOS PCT: 0 %
HCT: 30.2 % — ABNORMAL LOW (ref 40.0–52.0)
Hemoglobin: 9.9 g/dL — ABNORMAL LOW (ref 13.0–18.0)
LYMPHS ABS: 1.5 10*3/uL (ref 1.0–3.6)
Lymphocytes Relative: 9 %
MCH: 29 pg (ref 26.0–34.0)
MCHC: 32.6 g/dL (ref 32.0–36.0)
MCV: 89.1 fL (ref 80.0–100.0)
Monocytes Absolute: 1.2 10*3/uL — ABNORMAL HIGH (ref 0.2–1.0)
Monocytes Relative: 7 %
Neutro Abs: 13.1 10*3/uL — ABNORMAL HIGH (ref 1.4–6.5)
Neutrophils Relative %: 84 %
PLATELETS: 476 10*3/uL — AB (ref 150–440)
RBC: 3.39 MIL/uL — AB (ref 4.40–5.90)
RDW: 20 % — ABNORMAL HIGH (ref 11.5–14.5)
WBC: 15.8 10*3/uL — AB (ref 3.8–10.6)

## 2015-07-26 MED ORDER — SODIUM CHLORIDE 0.9 % IV SOLN
Freq: Once | INTRAVENOUS | Status: AC
  Administered 2015-07-26: 11:00:00 via INTRAVENOUS
  Filled 2015-07-26: qty 8

## 2015-07-26 MED ORDER — HEPARIN SOD (PORK) LOCK FLUSH 100 UNIT/ML IV SOLN
INTRAVENOUS | Status: AC
  Filled 2015-07-26: qty 5

## 2015-07-26 MED ORDER — SODIUM CHLORIDE 0.9 % IV SOLN: 80.0000 mg/m2 | Freq: Once | INTRAVENOUS | Status: DC

## 2015-07-26 MED ORDER — OMEPRAZOLE 40 MG PO CPDR: 40.0000 mg | DELAYED_RELEASE_CAPSULE | Freq: Every day | ORAL | Status: DC

## 2015-07-26 MED ORDER — VINCRISTINE SULFATE CHEMO INJECTION 1 MG/ML
2.0000 mg | Freq: Once | INTRAVENOUS | Status: AC
  Administered 2015-07-26: 2 mg via INTRAVENOUS
  Filled 2015-07-26: qty 2

## 2015-07-26 MED ORDER — HEPARIN SOD (PORK) LOCK FLUSH 100 UNIT/ML IV SOLN
500.0000 [IU] | Freq: Once | INTRAVENOUS | Status: AC | PRN
  Administered 2015-07-26: 500 [IU]

## 2015-07-26 MED ORDER — SODIUM CHLORIDE 0.9 % IV SOLN
80.0000 mg/m2 | Freq: Once | INTRAVENOUS | Status: AC
  Administered 2015-07-26: 170 mg via INTRAVENOUS
  Filled 2015-07-26: qty 8.5

## 2015-07-26 MED ORDER — SODIUM CHLORIDE 0.9 % IJ SOLN
10.0000 mL | INTRAMUSCULAR | Status: DC | PRN
  Administered 2015-07-26: 10 mL
  Filled 2015-07-26: qty 10

## 2015-07-26 MED ORDER — SODIUM CHLORIDE 0.9 % IV SOLN: Freq: Once | INTRAVENOUS | Status: DC

## 2015-07-26 MED ORDER — SODIUM CHLORIDE 0.9 % IV SOLN
750.0000 mg/m2 | Freq: Once | INTRAVENOUS | Status: AC
  Administered 2015-07-26: 1620 mg via INTRAVENOUS
  Filled 2015-07-26: qty 81

## 2015-07-26 MED ORDER — SODIUM CHLORIDE 0.9 % IV SOLN
Freq: Once | INTRAVENOUS | Status: AC
  Administered 2015-07-26: 11:00:00 via INTRAVENOUS
  Filled 2015-07-26: qty 1000

## 2015-07-27 ENCOUNTER — Inpatient Hospital Stay: Payer: Medicare Other

## 2015-07-27 VITALS — BP 145/77 | HR 79 | Temp 97.3°F | Resp 20

## 2015-07-27 DIAGNOSIS — C8448 Peripheral T-cell lymphoma, not classified, lymph nodes of multiple sites: Secondary | ICD-10-CM

## 2015-07-27 MED ORDER — SODIUM CHLORIDE 0.9 % IV SOLN
INTRAVENOUS | Status: DC
  Administered 2015-07-27: 14:00:00 via INTRAVENOUS
  Filled 2015-07-27: qty 1000

## 2015-07-27 MED ORDER — SODIUM CHLORIDE 0.9 % IV SOLN
Freq: Once | INTRAVENOUS | Status: AC
  Administered 2015-07-27: 14:00:00 via INTRAVENOUS
  Filled 2015-07-27: qty 4

## 2015-07-27 MED ORDER — HEPARIN SOD (PORK) LOCK FLUSH 100 UNIT/ML IV SOLN
500.0000 [IU] | Freq: Once | INTRAVENOUS | Status: AC
  Administered 2015-07-27: 500 [IU] via INTRAVENOUS
  Filled 2015-07-27: qty 5

## 2015-07-27 MED ORDER — SODIUM CHLORIDE 0.9 % IJ SOLN
10.0000 mL | Freq: Once | INTRAMUSCULAR | Status: AC
  Administered 2015-07-27: 10 mL via INTRAVENOUS
  Filled 2015-07-27: qty 10

## 2015-07-27 MED ORDER — SODIUM CHLORIDE 0.9 % IV SOLN
80.0000 mg/m2 | Freq: Once | INTRAVENOUS | Status: AC
  Administered 2015-07-27: 170 mg via INTRAVENOUS
  Filled 2015-07-27: qty 8.5

## 2015-07-28 ENCOUNTER — Inpatient Hospital Stay: Payer: Medicare Other

## 2015-07-28 VITALS — BP 143/76 | HR 78 | Temp 97.2°F | Resp 18

## 2015-07-28 DIAGNOSIS — C8448 Peripheral T-cell lymphoma, not classified, lymph nodes of multiple sites: Secondary | ICD-10-CM | POA: Diagnosis not present

## 2015-07-28 MED ORDER — HEPARIN SOD (PORK) LOCK FLUSH 100 UNIT/ML IV SOLN
INTRAVENOUS | Status: AC
  Filled 2015-07-28: qty 5

## 2015-07-28 MED ORDER — SODIUM CHLORIDE 0.9 % IV SOLN
Freq: Once | INTRAVENOUS | Status: AC
  Administered 2015-07-28: 13:00:00 via INTRAVENOUS
  Filled 2015-07-28: qty 4

## 2015-07-28 MED ORDER — SODIUM CHLORIDE 0.9 % IV SOLN
80.0000 mg/m2 | Freq: Once | INTRAVENOUS | Status: AC
  Administered 2015-07-28: 170 mg via INTRAVENOUS
  Filled 2015-07-28: qty 8.5

## 2015-07-28 MED ORDER — SODIUM CHLORIDE 0.9 % IV SOLN
INTRAVENOUS | Status: DC
  Administered 2015-07-28: 13:00:00 via INTRAVENOUS
  Filled 2015-07-28: qty 1000

## 2015-07-28 MED ORDER — PEGFILGRASTIM 6 MG/0.6ML ~~LOC~~ PSKT
6.0000 mg | PREFILLED_SYRINGE | Freq: Once | SUBCUTANEOUS | Status: AC
  Administered 2015-07-28: 6 mg via SUBCUTANEOUS
  Filled 2015-07-28: qty 0.6

## 2015-07-28 MED ORDER — HEPARIN SOD (PORK) LOCK FLUSH 100 UNIT/ML IV SOLN
500.0000 [IU] | Freq: Once | INTRAVENOUS | Status: AC
  Administered 2015-07-28: 500 [IU] via INTRAVENOUS

## 2015-07-28 MED ORDER — SODIUM CHLORIDE 0.9 % IJ SOLN
10.0000 mL | INTRAMUSCULAR | Status: DC | PRN
  Filled 2015-07-28: qty 10

## 2015-08-04 ENCOUNTER — Emergency Department: Payer: Medicare Other

## 2015-08-04 ENCOUNTER — Inpatient Hospital Stay
Admission: EM | Admit: 2015-08-04 | Discharge: 2015-08-07 | DRG: 872 | Disposition: A | Discharge status: Home / Self Care | Payer: Medicare Other | Attending: Internal Medicine | Admitting: Internal Medicine

## 2015-08-04 ENCOUNTER — Encounter: Payer: Self-pay | Admitting: Emergency Medicine

## 2015-08-04 DIAGNOSIS — M549 Dorsalgia, unspecified: Secondary | ICD-10-CM | POA: Diagnosis present

## 2015-08-04 DIAGNOSIS — G893 Neoplasm related pain (acute) (chronic): Secondary | ICD-10-CM | POA: Diagnosis present

## 2015-08-04 DIAGNOSIS — D630 Anemia in neoplastic disease: Secondary | ICD-10-CM | POA: Diagnosis present

## 2015-08-04 DIAGNOSIS — D6481 Anemia due to antineoplastic chemotherapy: Secondary | ICD-10-CM | POA: Diagnosis present

## 2015-08-04 DIAGNOSIS — K219 Gastro-esophageal reflux disease without esophagitis: Secondary | ICD-10-CM | POA: Diagnosis present

## 2015-08-04 DIAGNOSIS — Z8673 Personal history of transient ischemic attack (TIA), and cerebral infarction without residual deficits: Secondary | ICD-10-CM | POA: Diagnosis not present

## 2015-08-04 DIAGNOSIS — E7800 Pure hypercholesterolemia, unspecified: Secondary | ICD-10-CM | POA: Diagnosis present

## 2015-08-04 DIAGNOSIS — Z9221 Personal history of antineoplastic chemotherapy: Secondary | ICD-10-CM | POA: Diagnosis not present

## 2015-08-04 DIAGNOSIS — A419 Sepsis, unspecified organism: Principal | ICD-10-CM | POA: Diagnosis present

## 2015-08-04 DIAGNOSIS — Z7982 Long term (current) use of aspirin: Secondary | ICD-10-CM

## 2015-08-04 DIAGNOSIS — D649 Anemia, unspecified: Secondary | ICD-10-CM | POA: Diagnosis not present

## 2015-08-04 DIAGNOSIS — Z87891 Personal history of nicotine dependence: Secondary | ICD-10-CM | POA: Diagnosis not present

## 2015-08-04 DIAGNOSIS — R509 Fever, unspecified: Secondary | ICD-10-CM | POA: Diagnosis not present

## 2015-08-04 DIAGNOSIS — C8440 Peripheral T-cell lymphoma, not classified, unspecified site: Secondary | ICD-10-CM | POA: Diagnosis present

## 2015-08-04 DIAGNOSIS — Z79899 Other long term (current) drug therapy: Secondary | ICD-10-CM | POA: Diagnosis not present

## 2015-08-04 DIAGNOSIS — T451X5A Adverse effect of antineoplastic and immunosuppressive drugs, initial encounter: Secondary | ICD-10-CM | POA: Diagnosis present

## 2015-08-04 DIAGNOSIS — D72829 Elevated white blood cell count, unspecified: Secondary | ICD-10-CM | POA: Diagnosis not present

## 2015-08-04 DIAGNOSIS — R4182 Altered mental status, unspecified: Secondary | ICD-10-CM | POA: Diagnosis not present

## 2015-08-04 DIAGNOSIS — I10 Essential (primary) hypertension: Secondary | ICD-10-CM | POA: Diagnosis present

## 2015-08-04 DIAGNOSIS — D8989 Other specified disorders involving the immune mechanism, not elsewhere classified: Secondary | ICD-10-CM | POA: Diagnosis present

## 2015-08-04 DIAGNOSIS — C8442 Peripheral T-cell lymphoma, not classified, intrathoracic lymph nodes: Secondary | ICD-10-CM | POA: Insufficient documentation

## 2015-08-04 DIAGNOSIS — C8448 Peripheral T-cell lymphoma, not classified, lymph nodes of multiple sites: Secondary | ICD-10-CM | POA: Diagnosis not present

## 2015-08-04 LAB — CBC WITH DIFFERENTIAL/PLATELET
BAND NEUTROPHILS: 30 %
BASOS PCT: 0 %
Basophils Absolute: 0 10*3/uL (ref 0–0.1)
Blasts: 0 %
EOS ABS: 0 10*3/uL (ref 0–0.7)
EOS PCT: 0 %
HCT: 27.1 % — ABNORMAL LOW (ref 40.0–52.0)
HEMOGLOBIN: 8.7 g/dL — AB (ref 13.0–18.0)
LYMPHS ABS: 3.3 10*3/uL (ref 1.0–3.6)
LYMPHS PCT: 23 %
MCH: 29.4 pg (ref 26.0–34.0)
MCHC: 32.2 g/dL (ref 32.0–36.0)
MCV: 91.3 fL (ref 80.0–100.0)
MONO ABS: 1.6 10*3/uL — AB (ref 0.2–1.0)
MYELOCYTES: 4 %
Metamyelocytes Relative: 6 %
Monocytes Relative: 11 %
Neutro Abs: 9.6 10*3/uL — ABNORMAL HIGH (ref 1.4–6.5)
Neutrophils Relative %: 26 %
OTHER: 0 %
PLATELETS: 145 10*3/uL — AB (ref 150–440)
PROMYELOCYTES ABS: 0 %
RBC: 2.96 MIL/uL — ABNORMAL LOW (ref 4.40–5.90)
RDW: 19.2 % — ABNORMAL HIGH (ref 11.5–14.5)
WBC: 14.5 10*3/uL — ABNORMAL HIGH (ref 3.8–10.6)
nRBC: 0 /100 WBC

## 2015-08-04 LAB — INFLUENZA PANEL BY PCR (TYPE A & B)
H1N1FLUPCR: NOT DETECTED
INFLAPCR: NEGATIVE
INFLBPCR: NEGATIVE

## 2015-08-04 LAB — URINALYSIS COMPLETE WITH MICROSCOPIC (ARMC ONLY)
BACTERIA UA: NONE SEEN
Bilirubin Urine: NEGATIVE
Glucose, UA: NEGATIVE mg/dL
Hgb urine dipstick: NEGATIVE
KETONES UR: NEGATIVE mg/dL
Leukocytes, UA: NEGATIVE
Nitrite: NEGATIVE
PH: 8 (ref 5.0–8.0)
PROTEIN: 30 mg/dL — AB
SQUAMOUS EPITHELIAL / LPF: NONE SEEN
Specific Gravity, Urine: 1.015 (ref 1.005–1.030)

## 2015-08-04 LAB — COMPREHENSIVE METABOLIC PANEL
ALBUMIN: 3.5 g/dL (ref 3.5–5.0)
ALK PHOS: 490 U/L — AB (ref 38–126)
ALT: 280 U/L — AB (ref 17–63)
AST: 185 U/L — ABNORMAL HIGH (ref 15–41)
Anion gap: 5 (ref 5–15)
BUN: 15 mg/dL (ref 6–20)
CALCIUM: 8.8 mg/dL — AB (ref 8.9–10.3)
CHLORIDE: 100 mmol/L — AB (ref 101–111)
CO2: 27 mmol/L (ref 22–32)
CREATININE: 0.74 mg/dL (ref 0.61–1.24)
GFR calc Af Amer: >60 mL/min (ref >60)
GFR calc non Af Amer: >60 mL/min (ref >60)
GLUCOSE: 119 mg/dL — AB (ref 65–99)
Potassium: 3.8 mmol/L (ref 3.5–5.1)
SODIUM: 132 mmol/L — AB (ref 135–145)
Total Bilirubin: 0.9 mg/dL (ref 0.3–1.2)
Total Protein: 6.1 g/dL — ABNORMAL LOW (ref 6.5–8.1)

## 2015-08-04 LAB — LACTIC ACID, PLASMA
LACTIC ACID, VENOUS: 1.8 mmol/L (ref 0.5–2.0)
LACTIC ACID, VENOUS: 2.1 mmol/L — AB (ref 0.5–2.0)

## 2015-08-04 LAB — LIPASE, BLOOD: Lipase: 26 U/L (ref 11–51)

## 2015-08-04 LAB — TROPONIN I: Troponin I: 0.33 ng/mL — ABNORMAL HIGH (ref <0.031)

## 2015-08-04 MED ORDER — ACETAMINOPHEN 325 MG PO TABS
650.0000 mg | ORAL_TABLET | Freq: Once | ORAL | Status: DC
  Filled 2015-08-04: qty 2

## 2015-08-04 MED ORDER — ACETAMINOPHEN 650 MG RE SUPP
RECTAL | Status: AC
  Administered 2015-08-04: 650 mg via RECTAL
  Filled 2015-08-04: qty 1

## 2015-08-04 MED ORDER — VANCOMYCIN HCL IN DEXTROSE 1-5 GM/200ML-% IV SOLN
INTRAVENOUS | Status: AC
  Administered 2015-08-04: 1000 mg via INTRAVENOUS
  Filled 2015-08-04: qty 200

## 2015-08-04 MED ORDER — PIPERACILLIN-TAZOBACTAM 3.375 G IVPB
INTRAVENOUS | Status: AC
  Administered 2015-08-04: 3.375 g via INTRAVENOUS
  Filled 2015-08-04: qty 50

## 2015-08-04 MED ORDER — PIPERACILLIN-TAZOBACTAM 3.375 G IVPB
3.3750 g | Freq: Once | INTRAVENOUS | Status: AC
  Administered 2015-08-04: 3.375 g via INTRAVENOUS

## 2015-08-04 MED ORDER — VANCOMYCIN HCL IN DEXTROSE 1-5 GM/200ML-% IV SOLN
1000.0000 mg | Freq: Once | INTRAVENOUS | Status: AC
  Administered 2015-08-04: 1000 mg via INTRAVENOUS

## 2015-08-04 MED ORDER — OXYCODONE-ACETAMINOPHEN 5-325 MG PO TABS: 1.0000 | ORAL_TABLET | ORAL | Status: DC | PRN

## 2015-08-04 MED ORDER — PIPERACILLIN-TAZOBACTAM 3.375 G IVPB
3.3750 g | Freq: Three times a day (TID) | INTRAVENOUS | Status: DC
  Administered 2015-08-05 – 2015-08-07 (×7): 3.375 g via INTRAVENOUS
  Filled 2015-08-04 (×10): qty 50

## 2015-08-04 MED ORDER — SENNOSIDES-DOCUSATE SODIUM 8.6-50 MG PO TABS: 1.0000 | ORAL_TABLET | Freq: Every evening | ORAL | Status: DC | PRN

## 2015-08-04 MED ORDER — SODIUM CHLORIDE 0.9 % IV BOLUS (SEPSIS)
500.0000 mL | Freq: Once | INTRAVENOUS | Status: AC
  Administered 2015-08-04: 500 mL via INTRAVENOUS

## 2015-08-04 MED ORDER — ASPIRIN EC 81 MG PO TBEC
81.0000 mg | DELAYED_RELEASE_TABLET | Freq: Every day | ORAL | Status: DC
  Administered 2015-08-05 – 2015-08-07 (×3): 81 mg via ORAL
  Filled 2015-08-04 (×3): qty 1

## 2015-08-04 MED ORDER — PRAVASTATIN SODIUM 40 MG PO TABS
80.0000 mg | ORAL_TABLET | Freq: Every day | ORAL | Status: DC
  Administered 2015-08-05 – 2015-08-07 (×3): 80 mg via ORAL
  Filled 2015-08-04 (×3): qty 2

## 2015-08-04 MED ORDER — ASPIRIN 81 MG PO TABS: 81.0000 mg | ORAL_TABLET | Freq: Every day | ORAL | Status: DC

## 2015-08-04 MED ORDER — LOSARTAN POTASSIUM 50 MG PO TABS
50.0000 mg | ORAL_TABLET | Freq: Every day | ORAL | Status: DC
  Administered 2015-08-05 – 2015-08-07 (×3): 50 mg via ORAL
  Filled 2015-08-04 (×3): qty 1

## 2015-08-04 MED ORDER — SODIUM CHLORIDE 0.9 % IV BOLUS (SEPSIS)
1000.0000 mL | Freq: Once | INTRAVENOUS | Status: AC
  Administered 2015-08-04: 1000 mL via INTRAVENOUS

## 2015-08-04 MED ORDER — HEPARIN SODIUM (PORCINE) 5000 UNIT/ML IJ SOLN
5000.0000 [IU] | Freq: Three times a day (TID) | INTRAMUSCULAR | Status: DC
  Administered 2015-08-04 – 2015-08-07 (×8): 5000 [IU] via SUBCUTANEOUS
  Filled 2015-08-04 (×9): qty 1

## 2015-08-04 MED ORDER — PANTOPRAZOLE SODIUM 40 MG PO TBEC
40.0000 mg | DELAYED_RELEASE_TABLET | Freq: Every day | ORAL | Status: DC
  Administered 2015-08-04 – 2015-08-07 (×4): 40 mg via ORAL
  Filled 2015-08-04 (×4): qty 1

## 2015-08-04 MED ORDER — IBUPROFEN 600 MG PO TABS
600.0000 mg | ORAL_TABLET | Freq: Once | ORAL | Status: AC
Start: 2015-08-04 — End: 2015-08-04
  Administered 2015-08-04: 600 mg via ORAL
  Filled 2015-08-04: qty 1

## 2015-08-04 MED ORDER — ACETAMINOPHEN 325 MG PO TABS
650.0000 mg | ORAL_TABLET | Freq: Four times a day (QID) | ORAL | Status: DC | PRN
  Administered 2015-08-05: 13:00:00 650 mg via ORAL
  Filled 2015-08-04: qty 2

## 2015-08-04 MED ORDER — ONDANSETRON HCL 4 MG PO TABS: 4.0000 mg | ORAL_TABLET | Freq: Three times a day (TID) | ORAL | Status: DC | PRN

## 2015-08-04 MED ORDER — SODIUM CHLORIDE 0.9 % IV SOLN
1250.0000 mg | Freq: Two times a day (BID) | INTRAVENOUS | Status: DC
  Administered 2015-08-05 – 2015-08-07 (×5): 1250 mg via INTRAVENOUS
  Filled 2015-08-04 (×7): qty 1250

## 2015-08-04 MED ORDER — ACETAMINOPHEN 650 MG RE SUPP
650.0000 mg | Freq: Once | RECTAL | Status: AC
  Administered 2015-08-04: 650 mg via RECTAL

## 2015-08-04 NOTE — ED Provider Notes (Addendum)
[facility] Emergency Department Provider Note  ____________________________________________   I have reviewed the triage vital signs and the nursing notes.   HISTORY  Chief Complaint Fall and Fever    HPI Erik Shelton is a 74 y.o. male presents today complaining of fever. He is slightly confused. Patient does have a history of chemotherapy, he has a history of lymphoma, question seizure history the past, hypertension, and he last had chemotherapy last week. Patient was doing okay until last night. His chronic back pain which seem to be bothering him but otherwise no complaints. This morning he got up. According to EMS, the patient sustained a fall, however, when the family arrived they stated he did not fall but he was sitting on the side of the bed and looked weak. Fever was noted by EMS. The patient himself has no complaints but he does seem mildly confused and is not a reliable historian. There is no antecedent history of chest pain shortness of breath cough nausea vomiting or diarrhea the last few days although he did after chemotherapy have some diarrhea several days ago they can't exactly remember when.  Past Medical History  Diagnosis Date  . Stroke ([place])   . Seizures ([place])   . Hypercholesteremia   . HTN (hypertension)   . Cancer ([place])     T-Cell Lymphoma  . Seizures [facility])     Patient Active Problem List   Diagnosis Date Noted  . Intractable pain 04/14/2015  . 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 Peripheral vascular catheterization N/A 04/24/2015    Procedure: Glori Luis Cath Insertion;  Surgeon: Algernon Huxley, MD;  Location: [facility];  Service: Cardiovascular;  Laterality: N/A;    Current Outpatient Rx  Name  Route  Sig  Dispense  Refill  . acetaminophen (TYLENOL) 325 MG  tablet   Oral   Take 650 mg by mouth every 6 (six) hours as needed for mild pain, moderate pain, fever or headache.         Marland Kitchen aspirin 81 MG tablet   Oral   Take 81 mg by mouth daily.         Marland Kitchen lidocaine-prilocaine (EMLA) cream   Topical   Apply 1 application topically as needed.   30 g   2   . losartan (COZAAR) 50 MG tablet   Oral   Take 50 mg by mouth daily.         Marland Kitchen omeprazole (PRILOSEC) 40 MG capsule   Oral   Take 1 capsule (40 mg total) by mouth daily.   30 capsule   2   . pravastatin (PRAVACHOL) 80 MG tablet   Oral   Take 80 mg by mouth daily.         . predniSONE (DELTASONE) 20 MG tablet      Take 4.5 tablets (90mg ) daily on days 1 - 5 of chemotherapy   25 tablet   6   . senna-docusate (SENOKOT-S) 8.6-50 MG per tablet   Oral   Take 1 tablet by mouth at bedtime as needed for mild constipation.   30 tablet   2   . ondansetron (ZOFRAN) 4 MG tablet   Oral   Take 1-2 tablets (4-8 mg total) by mouth every 8 (eight) hours as needed for nausea (not responsive to  prochlorperazine (COMPAZINE)).   20 tablet   0   . oxyCODONE-acetaminophen (ROXICET) 5-325 MG per tablet   Oral   Take 1 tablet by mouth every 4 (four) hours as needed for severe pain.   60 tablet   0     Allergies No known allergies  No family history on file.  Social History Social History  Substance Use Topics  . Smoking status: Former Research scientist (life sciences)  . Smokeless tobacco: None     Comment: Quit  in 1990  . Alcohol Use: Yes     Comment: Occassional glass of wine    Review of Systems Constitutional: No fever/chills Eyes: No visual changes. ENT: No sore throat. No stiff neck no neck pain Cardiovascular: Denies chest pain. Respiratory: Denies shortness of breath. Gastrointestinal:   no vomiting.  No diarrhea.  No constipation. Genitourinary: Negative for dysuria. Musculoskeletal: Negative lower extremity swelling Skin: Negative for rash. Neurological: Negative for headaches, focal  weakness or numbness. 10-point ROS otherwise negative.  ____________________________________________   PHYSICAL EXAM:  VITAL SIGNS: ED Triage Vitals  Enc Vitals Group     BP 08/04/15 1138 150/84 mmHg     Pulse Rate 08/04/15 1138 137     Resp 08/04/15 1138 30     Temp 08/04/15 1138 104.9 F (40.5 C)     Temp Source 08/04/15 1138 Rectal     SpO2 08/04/15 1138 93 %     Weight 08/04/15 1108 192 lb (87.091 kg)     Height 08/04/15 1108 5\' 8"  (1.727 m)     Head Cir --      Peak Flow --      Pain Score 08/04/15 1053 0     Pain Loc --      Pain Edu? --      Excl. in [place]? --     Constitutional: Alert and oriented to name and place unsure of the date, occasionally will not answer questions. Eyes: Conjunctivae are normal. PERRL. EOMI. Head: Atraumatic. Nose: No congestion/rhinnorhea. Mouth/Throat: Mucous membranes are dry.  Oropharynx non-erythematous. Neck: No stridor.   Nontender with no meningismus Cardiovascular: Tachycardia noted Grossly normal heart sounds.  Good peripheral circulation. Respiratory: Normal respiratory effort.  No retractions. Lungs CTAB. Abdominal: Soft and nontender. No distention. No guarding no rebound Back:  There is no focal tenderness or step off there is no midline tenderness there are no lesions noted. there is no CVA tenderness Musculoskeletal: No lower extremity tenderness. No joint effusions, no DVT signs strong distal pulses no edema Neurologic:  Normal speech and language. No gross focal neurologic deficits are appreciated.  Skin:  Skin is warm, dry and intact. No rash noted. Psychiatric: Mood and affect are normal. Speech and behavior are normal.  ____________________________________________   LABS (all labs ordered are listed, but only abnormal results are displayed)  Labs Reviewed  COMPREHENSIVE METABOLIC PANEL - Abnormal; Notable for the following:    Sodium 132 (*)    Chloride 100 (*)    Glucose, Bld 119 (*)    Calcium 8.8 (*)    Total  Protein 6.1 (*)    AST 185 (*)    ALT 280 (*)    Alkaline Phosphatase 490 (*)    All other components within normal limits  CBC WITH DIFFERENTIAL/PLATELET - Abnormal; Notable for the following:    WBC 14.5 (*)    RBC 2.96 (*)    Hemoglobin 8.7 (*)    HCT 27.1 (*)    RDW 19.2 (*)  Platelets 145 (*)    All other components within normal limits  TROPONIN I - Abnormal; Notable for the following:    Troponin I 0.33 (*)    All other components within normal limits  URINALYSIS COMPLETEWITH MICROSCOPIC ([facility] ONLY) - Abnormal; Notable for the following:    Color, Urine YELLOW (*)    APPearance CLEAR (*)    Protein, ur 30 (*)    All other components within normal limits  CULTURE, BLOOD (ROUTINE X 2)  CULTURE, BLOOD (ROUTINE X 2)  URINE CULTURE  LACTIC ACID, PLASMA  LIPASE, BLOOD  LACTIC ACID, PLASMA  INFLUENZA PANEL BY PCR (TYPE A & B, H1N1)   ____________________________________________  EKG  I personally interpreted any EKGs ordered by me or triage Sinus tachycardia rate 133 bpm no acute ST elevation or acute ST depression aside from tachycardia R Almyra Free normal EKG ____________________________________________  RADIOLOGY  I reviewed any imaging ordered by me or triage that were performed during my shift ____________________________________________   PROCEDURES  Procedure(s) performed: None  Critical Care performed: CRITICAL CARE Performed by: Schuyler Amor   Total critical care time: 39 minutes  Critical care time was exclusive of separately billable procedures and treating other patients.  Critical care was necessary to treat or prevent imminent or life-threatening deterioration.  Critical care was time spent personally by me on the following activities: development of treatment plan with patient and/or surrogate as well as nursing, discussions with consultants, evaluation of patient's response to treatment, examination of patient, obtaining history from patient  or surrogate, ordering and performing treatments and interventions, ordering and review of laboratory studies, ordering and review of radiographic studies, pulse oximetry and re-evaluation of patient's condition.   ____________________________________________   INITIAL IMPRESSION / ASSESSMENT AND PLAN / ED COURSE  Pertinent labs & imaging results that were available during my care of the patient were reviewed by me and considered in my medical decision making (see chart for details).  Patient initiated is cared sepsis for the elevated temperature and heart rate, IV fluids have been commenced antipyretics have been given, temperature is trending down. Heart rate is trending down, pressure has been stable. Because of initial complaint of possible fall we did a CT scan of the head which is pending, chest x-ray is pending, we did start the patient rapidly on broad-spectrum antibiotics as he is likely minute, was given his history of chemotherapy and he had a fever 104, influenza is pending, we will continue to observe the patient closely here and he will require admission. ____________________________________________   FINAL CLINICAL IMPRESSION(S) / ED DIAGNOSES  Final diagnoses:  None     Schuyler Amor, MD 08/04/15 [place], MD 08/04/15 [phone]

## 2015-08-04 NOTE — ED Notes (Signed)
Unable to place order for temperature foley. Foley ordered at 1100 by Charlotte Crumb

## 2015-08-04 NOTE — H&P (Signed)
[facility] [place] NAME: Erik Shelton    MR#:  [id number]  DATE OF BIRTH:  May 26, 1941  DATE OF ADMISSION:  08/04/2015  PRIMARY CARE PHYSICIAN: [name]., MD   REQUESTING/REFERRING PHYSICIAN: McShane  CHIEF COMPLAINT:   Chief Complaint  Patient presents with  . Fall  . Fever    HISTORY OF PRESENT ILLNESS: Erik Shelton  is a 74 y.o. 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 Patient is completely alert and oriented he denies any complaints of shortness of breath chest pain, diarrhea or nausea, abdominal pain.  In ER so far all the workups came out to be negative for any source of infection, ER physician sent blood cultures and urine cultures, and influenza test.  PAST MEDICAL HISTORY:   Past Medical History  Diagnosis Date  . Stroke ([place])   . Seizures ([place])   . Hypercholesteremia   . HTN (hypertension)   . Cancer ([place])     T-Cell Lymphoma  . Seizures ([place])     PAST SURGICAL HISTORY:  Past Surgical History  Procedure Laterality Date  . Peripheral vascular catheterization N/A 04/24/2015    Procedure: Glori Luis Cath Insertion;  Surgeon: Algernon Huxley, MD;  Location: [facility];  Service: Cardiovascular;  Laterality: N/A;    SOCIAL HISTORY:  Social History  Substance Use Topics  . Smoking status: Former Research scientist (life sciences)  . Smokeless tobacco: Not on file     Comment: Quit  in 1990  . Alcohol Use: Yes     Comment: Occassional glass of wine    FAMILY HISTORY: No family history on file.  DRUG  ALLERGIES:  Allergies  Allergen Reactions  . No Known Allergies     REVIEW OF SYSTEMS:   CONSTITUTIONAL: Positive for fever and fatigue or weakness.  EYES: No blurred or double vision.  EARS, NOSE, AND THROAT: No tinnitus or ear pain.  RESPIRATORY: No cough, shortness of breath, wheezing or hemoptysis.  CARDIOVASCULAR: No chest pain, orthopnea, edema.  GASTROINTESTINAL: No nausea, vomiting, diarrhea or abdominal pain.  GENITOURINARY: No dysuria, hematuria. had one episode of urinary incontinence ENDOCRINE: No polyuria, nocturia,  HEMATOLOGY: No anemia, easy bruising or bleeding SKIN: No rash or lesion. MUSCULOSKELETAL: No joint pain or arthritis.   NEUROLOGIC: No tingling, numbness, weakness.  PSYCHIATRY: No anxiety or depression.   MEDICATIONS AT HOME:  Prior to Admission medications   Medication Sig Start Date End Date Taking? Authorizing Provider  acetaminophen (TYLENOL) 325 MG tablet Take 650 mg by mouth every 6 (six) hours as needed for mild pain, moderate pain, fever or headache.   Yes Historical Provider, MD  aspirin 81 MG tablet Take 81 mg by mouth daily.   Yes Historical Provider, MD  lidocaine-prilocaine (EMLA) cream Apply 1 application topically as needed. 05/03/15  Yes Lloyd Huger, MD  losartan (COZAAR) 50 MG tablet Take 50 mg by mouth daily.   Yes Historical Provider, MD  omeprazole (PRILOSEC) 40 MG capsule Take 1 capsule (40 mg total) by  mouth daily. 07/26/15  Yes Lloyd Huger, MD  pravastatin (PRAVACHOL) 80 MG tablet Take 80 mg by mouth daily.   Yes Historical Provider, MD  predniSONE (DELTASONE) 20 MG tablet Take 4.5 tablets (90mg ) daily on days 1 - 5 of chemotherapy 05/04/15  Yes Lloyd Huger, MD  senna-docusate (SENOKOT-S) 8.6-50 MG per tablet Take 1 tablet by mouth at bedtime as needed for mild constipation. 04/15/15  Yes Lloyd Huger, MD  ondansetron (ZOFRAN) 4 MG tablet Take 1-2 tablets (4-8 mg total) by mouth every 8 (eight) hours as needed  for nausea (not responsive to prochlorperazine (COMPAZINE)). 04/15/15   Lloyd Huger, MD  oxyCODONE-acetaminophen (ROXICET) 5-325 MG per tablet Take 1 tablet by mouth every 4 (four) hours as needed for severe pain. 05/04/15   Lloyd Huger, MD      PHYSICAL EXAMINATION:   VITAL SIGNS: Blood pressure 130/69, pulse 121, temperature 103.8 F (39.9 C), temperature source Rectal, resp. rate 28, height 5\' 8"  (1.727 m), weight 87.091 kg (192 lb), SpO2 96 %.  GENERAL:  74 y.o.-year-old patient lying in the bed with no acute distress.  EYES: Pupils equal, round, reactive to light and accommodation. No scleral icterus. Extraocular muscles intact.  HEENT: Head atraumatic, normocephalic. Oropharynx and nasopharynx clear.  NECK:  Supple, no jugular venous distention. No thyroid enlargement, no tenderness.  LUNGS: Normal breath sounds bilaterally, no wheezing, rales,rhonchi or crepitation. No use of accessory muscles of respiration. Right upper chest MediPort present. CARDIOVASCULAR: S1, S2 normal. No murmurs, rubs, or gallops.  ABDOMEN: Soft, nontender, nondistended. Bowel sounds present. No organomegaly or mass.  EXTREMITIES: No pedal edema, cyanosis, or clubbing.  NEUROLOGIC: Cranial nerves II through XII are intact. Muscle strength 5/5 in all extremities. Sensation intact. Gait not checked.  PSYCHIATRIC: The patient is alert and oriented x 3.  SKIN: No obvious rash, lesion, or ulcer.   LABORATORY PANEL:   CBC  Recent Labs Lab 08/04/15 1109  WBC 14.5*  HGB 8.7*  HCT 27.1*  PLT 145*  MCV 91.3  MCH 29.4  MCHC 32.2  RDW 19.2*  LYMPHSABS PENDING  MONOABS PENDING  EOSABS PENDING  BASOSABS PENDING   ------------------------------------------------------------------------------------------------------------------  Chemistries   Recent Labs Lab 08/04/15 1109  NA 132*  K 3.8  CL 100*  CO2 27  GLUCOSE 119*  BUN 15  CREATININE 0.74  CALCIUM 8.8*  AST 185*  ALT 280*   ALKPHOS 490*  BILITOT 0.9   ------------------------------------------------------------------------------------------------------------------ estimated creatinine clearance is 87 mL/min (by C-G formula based on Cr of 0.74). ------------------------------------------------------------------------------------------------------------------ No results for input(s): TSH, T4TOTAL, T3FREE, THYROIDAB in the last 72 hours.  Invalid input(s): FREET3   Coagulation profile No results for input(s): INR, PROTIME in the last 168 hours. ------------------------------------------------------------------------------------------------------------------- No results for input(s): DDIMER in the last 72 hours. -------------------------------------------------------------------------------------------------------------------  Cardiac Enzymes  Recent Labs Lab 08/04/15 1109  TROPONINI 0.33*   ------------------------------------------------------------------------------------------------------------------ Invalid input(s): POCBNP  ---------------------------------------------------------------------------------------------------------------  Urinalysis    Component Value Date/Time   COLORURINE YELLOW* 08/04/2015 [place] 12/09/2012 2224   APPEARANCEUR CLEAR* 08/04/2015 1109   APPEARANCEUR Clear 12/09/2012 2224   LABSPEC 1.015 08/04/2015 1109   LABSPEC 1.034 12/09/2012 2224   PHURINE 8.0 08/04/2015 1109   PHURINE 8.0 12/09/2012 2224   GLUCOSEU NEGATIVE 08/04/2015 1109   GLUCOSEU Negative 12/09/2012 2224   HGBUR NEGATIVE 08/04/2015 1109   HGBUR Negative 12/09/2012 2224   BILIRUBINUR NEGATIVE 08/04/2015 1109   BILIRUBINUR Negative 12/09/2012 [place] 08/04/2015  [place] Negative 12/09/2012 2224   PROTEINUR 30* 08/04/2015 1109   PROTEINUR Negative 12/09/2012 2224   NITRITE NEGATIVE 08/04/2015 1109   NITRITE Negative 12/09/2012 2224   LEUKOCYTESUR  NEGATIVE 08/04/2015 1109   LEUKOCYTESUR Negative 12/09/2012 2224     RADIOLOGY: Dg Chest 2 View  08/04/2015  CLINICAL DATA:  Patient brought in by [facility], EMS was called out for fall and possible seizure like activity with incontience. Patient has temp of 102.6 per EMS. Heart rate 140, BP 156/82, CBG 164. Patient has a hx/o lymphoma, last chemo treatment was Thursday. EXAM: CHEST  2 VIEW COMPARISON:  12/10/2012 FINDINGS: Lung volumes are low leading to crowding of the bronchovascular structures. There is no lung consolidation to suggest lobar pneumonia and no convincing pulmonary edema. No pleural effusion or pneumothorax. Cardiac silhouette is top-normal in size. No mediastinal or hilar masses or convincing adenopathy. Right anterior chest wall Port-A-Cath has its tip at the caval atrial junction, new since the prior study. Bony thorax is demineralized but intact. IMPRESSION: No acute cardiopulmonary disease. Electronically Signed   By: Lajean Manes M.D.   On: 08/04/2015 12:46   Ct Head Wo Contrast  08/04/2015  CLINICAL DATA:  Fall, possible seizure. EXAM: CT HEAD WITHOUT CONTRAST TECHNIQUE: Contiguous axial images were obtained from the base of the skull through the vertex without intravenous contrast. COMPARISON:  None. FINDINGS: Bony calvarium appears intact. Minimal diffuse cortical atrophy is noted. Mild chronic ischemic white matter disease is noted. No mass effect or midline shift is noted. Ventricular size is within normal limits. There is no evidence of mass lesion, hemorrhage or acute infarction. IMPRESSION: Minimal diffuse cortical atrophy. Mild chronic ischemic white matter disease. No acute intracranial abnormality seen. Electronically Signed   By: Marijo Conception, M.D.   On: 08/04/2015 12:49    IMPRESSION AND PLAN:  * Sepsis  Evident by elevated white cell count, tachycardia, fever  The source is unknown, as patient is immunocompromised because of T-cell lymphoma and being on  chemotherapy- was started on broad-spectrum antibiotics at this point.  Urine culture and blood cultures are sent.  X-ray chest and urinalysis are negative.  No cross cellulitis or any other skin lesions.  Influenza test is sent we need to follow the result.  * Hypertension  Continue losartan  * History of stroke  Continue aspirin and statin  * Chronic cancer related pain  He is on oxycodone at home, continue the same  * Esophageal reflux disease  Continue PPI.   All the records are reviewed and case discussed with ED provider. Management plans discussed with the patient, family and they are in agreement.  CODE STATUS:Full code  Discussed with patient's wife and girlfriend present in the room.  TOTAL TIME TAKING CARE OF THIS PATIENT: 50 minutes.    Vaughan Basta M.D on 08/04/2015   Between 7am to 6pm - Pager - [phone]  After 6pm go to [email or web address] - password [email or web address] [facility]  Office  [phone]  CC: Primary care physician; [name]., MD   Note: This dictation was prepared with Dragon dictation along with smaller phrase technology. Any transcriptional errors that result from this process are unintentional.

## 2015-08-04 NOTE — ED Notes (Signed)
Patient brought in by [facility], EMS was called out for fall and possible seizure like activity with incontience. Patient has temp of 102.6 per EMS. Heart rate 140, BP 156/82, CBG 164. Patient has a hx/o lymphoma, last chemo treatment was Thursday.

## 2015-08-04 NOTE — ED Notes (Signed)
Attempted to call report to the floor, RN is in another room, will call back.

## 2015-08-04 NOTE — Progress Notes (Signed)
ANTIBIOTIC CONSULT NOTE - INITIAL  Pharmacy Consult for Vancomycin and Zosyn Indication: Sepsis  Allergies  Allergen Reactions  . No Known Allergies     Patient Measurements: Height: 5\' 8"  (172.7 cm) Weight: 192 lb (87.091 kg) IBW/kg (Calculated) : 68.4 Adjusted Body Weight: 75.88kg  Vital Signs: Temp: 102.2 F (39 C) (11/18 1600) Temp Source: Rectal (11/18 1138) BP: 130/79 mmHg (11/18 1600) Pulse Rate: 114 (11/18 1600) Intake/Output from previous day:   Intake/Output from this shift:    Labs:  Recent Labs  08/04/15 1109  WBC 14.5*  HGB 8.7*  PLT 145*  CREATININE 0.74   Estimated Creatinine Clearance: 87 mL/min (by C-G formula based on Cr of 0.74). No results for input(s): VANCOTROUGH, VANCOPEAK, VANCORANDOM, GENTTROUGH, GENTPEAK, GENTRANDOM, TOBRATROUGH, TOBRAPEAK, TOBRARND, AMIKACINPEAK, AMIKACINTROU, AMIKACIN in the last 72 hours.   Microbiology: No results found for this or any previous visit (from the past 720 hour(s)).  Medical History: Past Medical History  Diagnosis Date  . Stroke ([place])   . Seizures ([place])   . Hypercholesteremia   . HTN (hypertension)   . Cancer ([place])     T-Cell Lymphoma  . Seizures ([place])     Medications:  Scheduled:   Infusions:  . piperacillin-tazobactam (ZOSYN)  IV    . vancomycin     Assessment: Erik Shelton is a 74 yo male admitted for sepsis. Pharmacy consulted to dose vancomycin and zosyn in this patient.  Goal of Therapy:  Vancomycin trough level 15-20 mcg/ml  Plan:  Measure antibiotic drug levels at steady state Follow up culture results   Initiate Zosyn 3.375g EIV q8hrs due to patients BMI <40.  Initiate Vancomycin 1250mg  IV q12hrs given 6 hours after the first dose for stacked dosing.   Ke= 0.075 hr-1 T1/2= 9.24 hrs Vd= 60.97 L  Expected Cmax= 36.75 Expected Cmin= 16.11  Will check trough at 0530 11/20, prior to 5th dose.  Pharmacy will continue to monitor for changes in renal function and culture  results.  Vena Rua 08/04/2015,5:38 PM

## 2015-08-05 DIAGNOSIS — Z9221 Personal history of antineoplastic chemotherapy: Secondary | ICD-10-CM

## 2015-08-05 DIAGNOSIS — C8448 Peripheral T-cell lymphoma, not classified, lymph nodes of multiple sites: Secondary | ICD-10-CM

## 2015-08-05 DIAGNOSIS — Z79899 Other long term (current) drug therapy: Secondary | ICD-10-CM

## 2015-08-05 DIAGNOSIS — D72829 Elevated white blood cell count, unspecified: Secondary | ICD-10-CM

## 2015-08-05 DIAGNOSIS — I10 Essential (primary) hypertension: Secondary | ICD-10-CM

## 2015-08-05 DIAGNOSIS — C8442 Peripheral T-cell lymphoma, not classified, intrathoracic lymph nodes: Secondary | ICD-10-CM | POA: Insufficient documentation

## 2015-08-05 DIAGNOSIS — Z8673 Personal history of transient ischemic attack (TIA), and cerebral infarction without residual deficits: Secondary | ICD-10-CM

## 2015-08-05 DIAGNOSIS — R4182 Altered mental status, unspecified: Secondary | ICD-10-CM

## 2015-08-05 DIAGNOSIS — E7800 Pure hypercholesterolemia, unspecified: Secondary | ICD-10-CM

## 2015-08-05 DIAGNOSIS — D649 Anemia, unspecified: Secondary | ICD-10-CM

## 2015-08-05 DIAGNOSIS — Z87891 Personal history of nicotine dependence: Secondary | ICD-10-CM

## 2015-08-05 LAB — BASIC METABOLIC PANEL
ANION GAP: 5 (ref 5–15)
BUN: 13 mg/dL (ref 6–20)
CALCIUM: 8.6 mg/dL — AB (ref 8.9–10.3)
CO2: 27 mmol/L (ref 22–32)
Chloride: 104 mmol/L (ref 101–111)
Creatinine, Ser: 0.8 mg/dL (ref 0.61–1.24)
Glucose, Bld: 112 mg/dL — ABNORMAL HIGH (ref 65–99)
POTASSIUM: 3.6 mmol/L (ref 3.5–5.1)
Sodium: 136 mmol/L (ref 135–145)

## 2015-08-05 LAB — CBC
HEMATOCRIT: 24.1 % — AB (ref 40.0–52.0)
HEMOGLOBIN: 7.8 g/dL — AB (ref 13.0–18.0)
MCH: 29.2 pg (ref 26.0–34.0)
MCHC: 32.2 g/dL (ref 32.0–36.0)
MCV: 90.9 fL (ref 80.0–100.0)
Platelets: 114 10*3/uL — ABNORMAL LOW (ref 150–440)
RBC: 2.65 MIL/uL — AB (ref 4.40–5.90)
RDW: 19.4 % — ABNORMAL HIGH (ref 11.5–14.5)
WBC: 20.8 10*3/uL — ABNORMAL HIGH (ref 3.8–10.6)

## 2015-08-05 NOTE — Plan of Care (Signed)
Problem: Safety: Goal: Ability to remain free from injury will improve Outcome: Progressing Pt is alert and oriented, limited english, wife at bedside, on room air, stable vitals, low grade temp improved with tylenol, ambulating in room, good appetite, denies pain, hgb at 7.8, platelets at 114, no s/s of bleeding, receiving sub q heparin, consulted with oncology with Dr. B. Uneventful shift.

## 2015-08-05 NOTE — Plan of Care (Signed)
Problem: Pain Managment: Goal: General experience of comfort will improve Outcome: Not Applicable Date Met:  37/85/88 Plan of Care Progress to Goal:   Pt has been oriented during shift. Pt denies pain. IV abx. Pt has been resting comfortably during shift. No other signs of distress noted. Will continue to monitor.

## 2015-08-05 NOTE — Consult Note (Signed)
[place] NOTE  Patient Care Team: [name], MD as PCP - General (Internal Medicine)  CHIEF COMPLAINTS/PURPOSE OF CONSULTATION:   HISTORY OF PRESENTING ILLNESS:  Erik Shelton 73 y.o.  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 Grayland Ormond in the [place] is currently on chemotherapy with CHOEP. Patient is currently status post 5 cycles. Patient's last cycle was approximately 10 days ago; patient received Neulasta.  As per the wife patient started to have high temperatures up to 104 at home; and also noted to have altered mental status. Denied any nausea or vomiting. No diarrhea. Positive for chills. No chest pain or shortness of breath. No cough.  Given the change in the clinical status patient was brought to the emergency room. Chest x-ray does not show any evidence of any pneumonia. CT brain negative for any acute process.  ROS: A complete 10 point review of system is done which is negative except mentioned above in history of present illness  MEDICAL HISTORY:  Past Medical History  Diagnosis Date  . Stroke ([place])   . Seizures ([place])   . Hypercholesteremia   . HTN (hypertension)   . Cancer ([place])     T-Cell Lymphoma  . Seizures ([place])     SURGICAL HISTORY: Past Surgical History  Procedure Laterality Date  . Peripheral vascular catheterization N/A 04/24/2015    Procedure: Glori Luis Cath Insertion;  Surgeon: Algernon Huxley, MD;  Location: [facility];  Service: Cardiovascular;  Laterality: N/A;    SOCIAL HISTORY: Social History   Social History  . Marital Status: Married    Spouse Name: N/A  . Number of Children: N/A  . Years of Education: N/A   Occupational History  . Not on file.   Social History Main Topics  . Smoking status: Former Research scientist (life sciences)  . Smokeless tobacco: Not on file     Comment: Quit  in 1990  . Alcohol Use: Yes     Comment:  Occassional glass of wine  . Drug Use: No  . Sexual Activity: Not on file   Other Topics Concern  . Not on file   Social History Narrative    FAMILY HISTORY: No history of cancers in the family.  ALLERGIES:  is allergic to no known allergies.  MEDICATIONS:  Current Facility-Administered Medications  Medication Dose Route Frequency Provider Last Rate Last Dose  . acetaminophen (TYLENOL) tablet 650 mg  650 mg Oral Q6H PRN Vaughan Basta, MD      . aspirin EC tablet 81 mg  81 mg Oral Daily Vena Rua, RPH   81 mg at 08/05/15 0900  . heparin injection 5,000 Units  5,000 Units Subcutaneous 3 times per day Vaughan Basta, MD   5,000 Units at 08/05/15 0618  . losartan (COZAAR) tablet 50 mg  50 mg Oral Daily Vaughan Basta, MD   50 mg at 08/05/15 0900  . ondansetron (ZOFRAN) tablet 4-8 mg  4-8 mg Oral Q8H PRN Vaughan Basta, MD      . oxyCODONE-acetaminophen (PERCOCET/ROXICET) 5-325 MG per tablet 1 tablet  1 tablet Oral Q4H PRN Vaughan Basta, MD      . pantoprazole (PROTONIX) EC tablet 40 mg  40 mg Oral Daily Vaughan Basta, MD   40 mg at 08/05/15 0900  . piperacillin-tazobactam (ZOSYN) IVPB 3.375 g  3.375 g Intravenous 3 times per day Vena Rua, RPH   3.375 g at  08/05/15 0346  . pravastatin (PRAVACHOL) tablet 80 mg  80 mg Oral Daily Vaughan Basta, MD   80 mg at 08/05/15 0900  . senna-docusate (Senokot-S) tablet 1 tablet  1 tablet Oral QHS PRN Vaughan Basta, MD      . vancomycin (VANCOCIN) 1,250 mg in sodium chloride 0.9 % 250 mL IVPB  1,250 mg Intravenous Q12H Vena Rua, RPH   1,250 mg at 08/05/15 0617   Facility-Administered Medications Ordered in Other Encounters  Medication Dose Route Frequency Provider Last Rate Last Dose  . sodium chloride 0.9 % injection 10 mL  10 mL Intracatheter PRN Lloyd Huger, MD   10 mL at 07/05/15 0900      .  PHYSICAL EXAMINATION:  Filed Vitals:   08/05/15 0542   BP: 103/55  Pulse: 102  Temp: 99.3 F (37.4 C)  Resp: 18   Filed Weights   08/04/15 1108 08/04/15 1847  Weight: 192 lb (87.091 kg) 199 lb 8 oz (90.493 kg)    GENERAL: Well-nourished well-developed; Alert, no distress and comfortable.   Accompanied by his wife. He is sitting in a chair. EYES: no pallor or icterus OROPHARYNX: no thrush or ulceration; good dentition  NECK: supple, no masses felt LYMPH:  no palpable lymphadenopathy in the cervical; positive for axillary adenopathy in the right underarm. LUNGS: clear to auscultation and  No wheeze or crackles HEART/CVS: regular rate & rhythm and no murmurs; No lower extremity edema ABDOMEN: abdomen soft, non-tender and normal bowel sounds Musculoskeletal:no cyanosis of digits and no clubbing  PSYCH: alert & oriented x 3 with fluent speech NEURO: no focal motor/sensory deficits SKIN:  no rashes or significant lesions  LABORATORY DATA:  I have reviewed the data as listed Lab Results  Component Value Date   WBC 20.8* 08/05/2015   HGB 7.8* 08/05/2015   HCT 24.1* 08/05/2015   MCV 90.9 08/05/2015   PLT 114* 08/05/2015    Recent Labs  07/05/15 0851 07/26/15 0838 08/04/15 1109 08/05/15 0343  NA 135 135 132* 136  K 4.0 4.2 3.8 3.6  CL 102 101 100* 104  CO2 27 25 27 27   GLUCOSE 132* 156* 119* 112*  BUN 14 17 15 13   CREATININE 0.72 0.85 0.74 0.80  CALCIUM 8.8* 9.1 8.8* 8.6*  GFRNONAA >60 >60 >60 >60  GFRAA >60 >60 >60 >60  PROT 7.0 6.6 6.1*  --   ALBUMIN 3.7 3.8 3.5  --   AST 18 22 185*  --   ALT 16* 19 280*  --   ALKPHOS 123 98 490*  --   BILITOT 0.4 0.4 0.9  --     RADIOGRAPHIC STUDIES: I have personally reviewed the radiological images as listed and agreed with the findings in the report. Dg Chest 2 View  08/04/2015  CLINICAL DATA:  Patient brought in by [facility], EMS was called out for fall and possible seizure like activity with incontience. Patient has temp of 102.6 per EMS. Heart rate 140, BP 156/82, CBG 164.  Patient has a hx/o lymphoma, last chemo treatment was Thursday. EXAM: CHEST  2 VIEW COMPARISON:  12/10/2012 FINDINGS: Lung volumes are low leading to crowding of the bronchovascular structures. There is no lung consolidation to suggest lobar pneumonia and no convincing pulmonary edema. No pleural effusion or pneumothorax. Cardiac silhouette is top-normal in size. No mediastinal or hilar masses or convincing adenopathy. Right anterior chest wall Port-A-Cath has its tip at the caval atrial junction, new since the prior study. Bony thorax is  demineralized but intact. IMPRESSION: No acute cardiopulmonary disease. Electronically Signed   By: Lajean Manes M.D.   On: 08/04/2015 12:46   Ct Head Wo Contrast  08/04/2015  CLINICAL DATA:  Fall, possible seizure. EXAM: CT HEAD WITHOUT CONTRAST TECHNIQUE: Contiguous axial images were obtained from the base of the skull through the vertex without intravenous contrast. COMPARISON:  None. FINDINGS: Bony calvarium appears intact. Minimal diffuse cortical atrophy is noted. Mild chronic ischemic white matter disease is noted. No mass effect or midline shift is noted. Ventricular size is within normal limits. There is no evidence of mass lesion, hemorrhage or acute infarction. IMPRESSION: Minimal diffuse cortical atrophy. Mild chronic ischemic white matter disease. No acute intracranial abnormality seen. Electronically Signed   By: Marijo Conception, M.D.   On: 08/04/2015 12:49   Nm Pet Image Restag (ps) Skull Base To Thigh  07/24/2015  CLINICAL DATA:  Subsequent treatment strategy for T-cell lymphoma. EXAM: NUCLEAR MEDICINE PET SKULL BASE TO THIGH TECHNIQUE: 12.26 mCi F-18 FDG was injected intravenously. Full-ring PET imaging was performed from the skull base to thigh after the radiotracer. CT data was obtained and used for attenuation correction and anatomic localization. FASTING BLOOD GLUCOSE:  Value: 101 mg/dl COMPARISON:  04/28/2015 FINDINGS: NECK No hypermetabolic lymph  nodes in the neck. CHEST Index left axillary lymph node measure 1.3 cm and has an SUV max equal 2.04. Previously this measured 1.5 cm and had an SUV max equal to 3.79. The index right axillary node measures 1.5 cm and has an SUV max equal to 2.47. On the previous exam this measure 2.3 cm and had an SUV max equal to 3.79. Cardiac enlargement is again identified. There is aortic atherosclerosis as well as LAD coronary artery and RCA calcification. The trachea appears patent and is midline. Normal appearance of the esophagus. The index AP window lymph node measures 1.2 cm and has an SUV max equal to 2.38. On the previous exam this measured 1.2 cm and had an SUV max equal to 4.14. Right paratracheal lymph node currently measures 1.2 cm and had an SUV max equal to 3.9. Previously this measured 1.3 cm and had an SUV max equal to 4.0. Right hilar node measures 1.4 cm and has an SUV max equal to 4.35. On the previous exam this measured 1.6 cm and had an SUV max equal to 4.55. No pleural fluid identified. No hypermetabolic pulmonary nodules or masses. ABDOMEN/PELVIS No abnormal hypermetabolic activity within the liver, pancreas, adrenal glands, or spleen. Bilateral renal cysts are identified. There is aortic atherosclerosis noted. Index gastrohepatic ligament lymph node measures 9 mm and has an SUV max equal to 3.06. On the previous exam this measured 1 cm and had an SUV max equal to 3.8. The index aortocaval lymph node measures 7 mm and has an SUV max equal to 1.85. Previously this measured 1 cm and had an SUV max equal to 3.3. Enlarged and hypermetabolic iliac and bilateral inguinal lymph nodes appear improved from previous exam. Index right external iliac node measure 9 mm and has an SUV max equal to 1.88. Previously this measured 1.4 cm and had an SUV max equal to 3.88. Index right inguinal lymph node Measures 1.8 cm and has an SUV max equal to 1.56. On the previous exam this measured 2 cm and had an SUV max equal to  2.8. The left inguinal lymph node Measures 1.1 cm and has an SUV max equal to 1.46. Previously this measured 1.3 cm and had an  SUV max equal to 3.1. SKELETON Again noted is mild diffuse increased radiotracer uptake throughout the axial and appendicular skeleton. IMPRESSION: 1. Overall there has been an interval decrease in size and degree of FDG uptake associated with hypermetabolic axillary, mediastinal, hilar, retroperitoneal and pelvic adenopathy. Electronically Signed   By: Kerby Moors M.D.   On: 07/24/2015 11:43    ASSESSMENT & PLAN:   74 year old male patient with above history of peripheral T-cell lymphoma NOS stage IV- currently on chemotherapy with CHOEP status post cycle #5 day #10 currently admitted to the hospital for fevers.  # Fever- on chemotherapy/lymphoma. Immunocompromise state. On IV vancomycin and Zosyn. Source of infection unclear.  # Peripheral T-cell lymphoma- on chemotherapy cycle #5 day #10; status post Neulasta; patient had partial response on a follow-up PET scan after 4 treatments  # Leukocytosis-unreliable marker of inflammatory state as patient received Neulasta  Recommendations:   # Agree with medical management with IV antibiotics. Monitor closely.  # If patient's clinical status does not improve/worsens- consider ID evaluation.  Thank you Dr.Vacchani for allowing me to participate in the care of your pleasant patient. Please do not hesitate to contact me if any questions or concerns. The above plan was discussed with Dr.Vacchani.   Reviewed the images independently.   All questions were answered. The patient knows to call the clinic with any problems, questions or concerns.      Cammie Sickle, MD 08/05/2015 9:51 AM

## 2015-08-05 NOTE — Progress Notes (Signed)
[facility] NAME: Erik Shelton    MR#:  [id number]  DATE OF BIRTH:  [birth year]  SUBJECTIVE:  CHIEF COMPLAINT:   Chief Complaint  Patient presents with  . Fall  . Fever    Had fever again in night. No complains.  REVIEW OF SYSTEMS:  CONSTITUTIONAL: Positive for fever and fatigue or weakness.  EYES: No blurred or double vision.  EARS, NOSE, AND THROAT: No tinnitus or ear pain.  RESPIRATORY: No cough, shortness of breath, wheezing or hemoptysis.  CARDIOVASCULAR: No chest pain, orthopnea, edema.  GASTROINTESTINAL: No nausea, vomiting, diarrhea or abdominal pain.  GENITOURINARY: No dysuria, hematuria. had one episode of urinary incontinence ENDOCRINE: No polyuria, nocturia,  HEMATOLOGY: No anemia, easy bruising or bleeding SKIN: No rash or lesion. MUSCULOSKELETAL: No joint pain or arthritis.  NEUROLOGIC: No tingling, numbness, weakness.  PSYCHIATRY: No anxiety or depression.   ROS  DRUG ALLERGIES:   Allergies  Allergen Reactions  . No Known Allergies     VITALS:  Blood pressure 123/63, pulse 118, temperature 100.6 F (38.1 C), temperature source Oral, resp. rate 20, height 5\' 8"  (1.727 m), weight 90.493 kg (199 lb 8 oz), SpO2 98 %.  PHYSICAL EXAMINATION:   GENERAL: 74 y.o.-year-old patient lying in the bed with no acute distress.  EYES: Pupils equal, round, reactive to light and accommodation. No scleral icterus. Extraocular muscles intact.  HEENT: Head atraumatic, normocephalic. Oropharynx and nasopharynx clear.  NECK: Supple, no jugular venous distention. No thyroid enlargement, no tenderness.  LUNGS: Normal breath sounds bilaterally, no wheezing, rales,rhonchi or crepitation. No use of accessory muscles of respiration. Right upper chest MediPort present. CARDIOVASCULAR: S1, S2 normal. No murmurs, rubs, or gallops.  ABDOMEN: Soft, nontender, nondistended. Bowel sounds present. No  organomegaly or mass.  EXTREMITIES: No pedal edema, cyanosis, or clubbing.  NEUROLOGIC: Cranial nerves II through XII are intact. Muscle strength 5/5 in all extremities. Sensation intact. Gait not checked.  PSYCHIATRIC: The patient is alert and oriented x 3.  SKIN: No obvious rash, lesion, or ulcer.   Physical Exam LABORATORY PANEL:   CBC  Recent Labs Lab 08/05/15 0343  WBC 20.8*  HGB 7.8*  HCT 24.1*  PLT 114*   ------------------------------------------------------------------------------------------------------------------  Chemistries   Recent Labs Lab 08/04/15 1109 08/05/15 0343  NA 132* 136  K 3.8 3.6  CL 100* 104  CO2 27 27  GLUCOSE 119* 112*  BUN 15 13  CREATININE 0.74 0.80  CALCIUM 8.8* 8.6*  AST 185*  --   ALT 280*  --   ALKPHOS 490*  --   BILITOT 0.9  --    ------------------------------------------------------------------------------------------------------------------  Cardiac Enzymes  Recent Labs Lab 08/04/15 1109  TROPONINI 0.33*   ------------------------------------------------------------------------------------------------------------------  RADIOLOGY:  Dg Chest 2 View  08/04/2015  CLINICAL DATA:  Patient brought in by [facility], EMS was called out for fall and possible seizure like activity with incontience. Patient has temp of 102.6 per EMS. Heart rate 140, BP 156/82, CBG 164. Patient has a hx/o lymphoma, last chemo treatment was Thursday. EXAM: CHEST  2 VIEW COMPARISON:  12/10/2012 FINDINGS: Lung volumes are low leading to crowding of the bronchovascular structures. There is no lung consolidation to suggest lobar pneumonia and no convincing pulmonary edema. No pleural effusion or pneumothorax. Cardiac silhouette is top-normal in size. No mediastinal or hilar masses or convincing adenopathy. Right anterior chest wall Port-A-Cath has its tip at the caval atrial junction, new since the prior study. Bony thorax  is demineralized but intact.  IMPRESSION: No acute cardiopulmonary disease. Electronically Signed   By: Lajean Manes M.D.   On: 08/04/2015 12:46   Ct Head Wo Contrast  08/04/2015  CLINICAL DATA:  Fall, possible seizure. EXAM: CT HEAD WITHOUT CONTRAST TECHNIQUE: Contiguous axial images were obtained from the base of the skull through the vertex without intravenous contrast. COMPARISON:  None. FINDINGS: Bony calvarium appears intact. Minimal diffuse cortical atrophy is noted. Mild chronic ischemic white matter disease is noted. No mass effect or midline shift is noted. Ventricular size is within normal limits. There is no evidence of mass lesion, hemorrhage or acute infarction. IMPRESSION: Minimal diffuse cortical atrophy. Mild chronic ischemic white matter disease. No acute intracranial abnormality seen. Electronically Signed   By: Marijo Conception, M.D.   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 Urine culture and blood cultures are sent. X-ray chest and urinalysis are negative. No cross cellulitis or any other skin lesions. Influenza test is sent- negative.  * Hypertension Continue losartan  * History of stroke Continue aspirin and statin.  * Chronic cancer related pain He is on oxycodone at home, continue the same  * Esophageal reflux disease Continue PPI.  All the records are reviewed and case discussed with Care Management/Social Workerr. Management plans discussed with the patient, family and they are in agreement.  CODE STATUS: full  TOTAL TIME TAKING CARE OF THIS PATIENT: 35 minutes.     POSSIBLE D/C IN 1-2 DAYS, DEPENDING  ON CLINICAL CONDITION.   Vaughan Basta M.D on 08/05/2015   Between 7am to 6pm - Pager - [phone]  After 6pm go to [email or web address] - password [email or web address] [facility]  Office  [phone]  CC: Primary care physician; [name]., MD  Note: This dictation was prepared with Dragon dictation along with smaller phrase technology. Any transcriptional errors that result from this process are unintentional.

## 2015-08-06 DIAGNOSIS — R509 Fever, unspecified: Secondary | ICD-10-CM

## 2015-08-06 LAB — URINE CULTURE: CULTURE: NO GROWTH

## 2015-08-06 LAB — CBC
HEMATOCRIT: 24 % — AB (ref 40.0–52.0)
Hemoglobin: 7.6 g/dL — ABNORMAL LOW (ref 13.0–18.0)
MCH: 28.5 pg (ref 26.0–34.0)
MCHC: 31.5 g/dL — ABNORMAL LOW (ref 32.0–36.0)
MCV: 90.3 fL (ref 80.0–100.0)
PLATELETS: 115 10*3/uL — AB (ref 150–440)
RBC: 2.66 MIL/uL — AB (ref 4.40–5.90)
RDW: 19.4 % — AB (ref 11.5–14.5)
WBC: 24.8 10*3/uL — AB (ref 3.8–10.6)

## 2015-08-06 LAB — VANCOMYCIN, TROUGH: Vancomycin Tr: 16 ug/mL (ref 10–20)

## 2015-08-06 NOTE — Progress Notes (Signed)
ANTIBIOTIC CONSULT NOTE - INITIAL  Pharmacy Consult for Vancomycin and Zosyn Indication: Sepsis  Allergies  Allergen Reactions  . No Known Allergies     Patient Measurements: Height: 5\' 8"  (172.7 cm) Weight: 199 lb 8 oz (90.493 kg) IBW/kg (Calculated) : 68.4 Adjusted Body Weight: 75.88kg  Vital Signs: Temp: 99.2 F (37.3 C) (11/20 0526) Temp Source: Oral (11/20 0526) BP: 127/71 mmHg (11/20 0526) Pulse Rate: 89 (11/20 0526) Intake/Output from previous day: 11/19 0701 - 11/20 0700 In: 660 [P.O.:360; IV Piggyback:300] Out: 1975 [id number] Intake/Output from this shift: Total I/O In: -  Out: 925 [Urine:925]  Labs:  Recent Labs  08/04/15 1109 08/05/15 0343 08/06/15 0543  WBC 14.5* 20.8* 24.8*  HGB 8.7* 7.8* 7.6*  PLT 145* 114* 115*  CREATININE 0.74 0.80  --    Estimated Creatinine Clearance: 88.5 mL/min (by C-G formula based on Cr of 0.8).  Recent Labs  08/06/15 0543  [place] 16     Microbiology: Recent Results (from the past 720 hour(s))  Blood Culture (routine x 2)     Status: None (Preliminary result)   Collection Time: 08/04/15 11:05 AM  Result Value Ref Range Status   Specimen Description BLOOD LEFT PORTA CATH  Final   Special Requests BOTTLES DRAWN AEROBIC AND ANAEROBIC  [place]  Final   Culture NO GROWTH < 24 HOURS  Final   Report Status PENDING  Incomplete  Urine culture     Status: None (Preliminary result)   Collection Time: 08/04/15 11:09 AM  Result Value Ref Range Status   Specimen Description URINE, RANDOM  Final   Special Requests NONE  Final   Culture NO GROWTH < 24 HOURS  Final   Report Status PENDING  Incomplete  Blood Culture (routine x 2)     Status: None (Preliminary result)   Collection Time: 08/04/15 11:10 AM  Result Value Ref Range Status   Specimen Description BLOOD RIGHT AC  Final   Special Requests   Final    BOTTLES DRAWN AEROBIC AND ANAEROBIC  AER 6CC ANA 2CC   Culture NO GROWTH < 24 HOURS  Final   Report Status  PENDING  Incomplete    Medical History: Past Medical History  Diagnosis Date  . Stroke ([place])   . Seizures ([place])   . Hypercholesteremia   . HTN (hypertension)   . Cancer ([place])     T-Cell Lymphoma  . Seizures (HCC)     Medications:  Scheduled:  . aspirin EC  81 mg Oral Daily  . heparin  5,000 Units Subcutaneous 3 times per day  . losartan  50 mg Oral Daily  . pantoprazole  40 mg Oral Daily  . piperacillin-tazobactam (ZOSYN)  IV  3.375 g Intravenous 3 times per day  . pravastatin  80 mg Oral Daily  . vancomycin  1,250 mg Intravenous Q12H   Infusions:    Assessment: Erik Shelton is a 74 yo male admitted for sepsis. Pharmacy consulted to dose vancomycin and zosyn in this patient.  Goal of Therapy:  Vancomycin trough level 15-20 mcg/ml  Plan:  Measure antibiotic drug levels at steady state Follow up culture results   Initiate Zosyn 3.375g EIV q8hrs due to patients BMI <40.  Initiate Vancomycin 1250mg  IV q12hrs given 6 hours after the first dose for stacked dosing.   Ke= 0.075 hr-1 T1/2= 9.24 hrs Vd= 60.97 L  Expected Cmax= 36.75 Expected Cmin= 16.11  Will check trough at 0530 11/20, prior to 5th dose.  11/20 AM vanc level  16. Continue current regimen.  Pharmacy will continue to monitor for changes in renal function and culture results.  Erik Shelton S 08/06/2015,6:22 AM

## 2015-08-06 NOTE — Plan of Care (Signed)
Problem: Safety: Goal: Ability to remain free from injury will improve Outcome: Progressing Pt denies pain and is alert. Pt has been resting comfortably during shift. No other signs of distress noted. Will continue to monitor.

## 2015-08-06 NOTE — Progress Notes (Signed)
[place]  [phone] [phone]) [phone]  ID: Velvet Bathe OB: 1941-09-04  MR#: [id number]  [id number]  Patient Care Team: [name], MD as PCP - General (Internal Medicine)  CHIEF COMPLAINT:  Chief Complaint  Patient presents with  . Lymphoma    INTERVAL HISTORY: Patient returns to clinic today for further evaluation, discussion of his imaging results, and consideration of cycle 5 of etoposide plus CVP. He currently feels well and is asymptomatic. He does not complain of weakness or fatigue today. He does not complain of pain today. His nausea and appetite have improved. He has no chest pain or shortness of breath. He denies any vomiting, constipation, or diarrhea. Patient offers no specific complaints today.  REVIEW OF SYSTEMS:   Review of Systems  Constitutional: Negative for fever and malaise/fatigue.  Respiratory: Negative.   Cardiovascular: Negative.   Gastrointestinal: Negative for nausea and abdominal pain.  Musculoskeletal: Negative.   Neurological: Negative.  Negative for weakness.  Endo/Heme/Allergies: Does not bruise/bleed easily.    As per HPI. Otherwise, a complete review of systems is negatve.  PAST MEDICAL HISTORY: Past Medical History  Diagnosis Date  . Stroke ([place])   . Seizures ([place])   . Hypercholesteremia   . HTN (hypertension)   . Cancer ([place])     T-Cell Lymphoma  . Seizures ([place])     PAST SURGICAL HISTORY: Past Surgical History  Procedure Laterality Date  . Peripheral vascular catheterization N/A 04/24/2015    Procedure: Glori Luis Cath Insertion;  Surgeon: Algernon Huxley, MD;  Location: [place] [facility];  Service: Cardiovascular;  Laterality: N/A;    FAMILY HISTORY: Reviewed and unchanged. No reported history of malignancy or chronic disease.     ADVANCED DIRECTIVES:    HEALTH MAINTENANCE: Social History  Substance Use Topics  . Smoking status: Former Research scientist (life sciences)  . Smokeless tobacco: Not on file      Comment: Quit  in 1990  . Alcohol Use: Yes     Comment: Occassional glass of wine     Colonoscopy:  PAP:  Bone density:  Lipid panel:  Allergies  Allergen Reactions  . No Known Allergies     No current facility-administered medications for this visit.   No current outpatient prescriptions on file.   Facility-Administered Medications Ordered in Other Visits  Medication Dose Route Frequency Provider Last Rate Last Dose  . acetaminophen (TYLENOL) tablet 650 mg  650 mg Oral Q6H PRN Vaughan Basta, MD   650 mg at 08/05/15 1316  . aspirin EC tablet 81 mg  81 mg Oral Daily Vena Rua, RPH   81 mg at 08/05/15 0900  . heparin injection 5,000 Units  5,000 Units Subcutaneous 3 times per day Vaughan Basta, MD   5,000 Units at 08/06/15 [phone]  . losartan (COZAAR) tablet 50 mg  50 mg Oral Daily Vaughan Basta, MD   50 mg at 08/05/15 0900  . ondansetron (ZOFRAN) tablet 4-8 mg  4-8 mg Oral Q8H PRN Vaughan Basta, MD      . oxyCODONE-acetaminophen (PERCOCET/ROXICET) 5-325 MG per tablet 1 tablet  1 tablet Oral Q4H PRN Vaughan Basta, MD      . pantoprazole (PROTONIX) EC tablet 40 mg  40 mg Oral Daily Vaughan Basta, MD   40 mg at 08/05/15 0900  . piperacillin-tazobactam (ZOSYN) IVPB 3.375 g  3.375 g Intravenous 3 times per day Vena Rua, RPH   3.375 g at 08/06/15 [id number]  . pravastatin (PRAVACHOL) tablet 80 mg  80  mg Oral Daily Vaughan Basta, MD   80 mg at 08/05/15 0900  . senna-docusate (Senokot-S) tablet 1 tablet  1 tablet Oral QHS PRN Vaughan Basta, MD      . sodium chloride 0.9 % injection 10 mL  10 mL Intracatheter PRN Lloyd Huger, MD   10 mL at 07/05/15 0900  . vancomycin (VANCOCIN) 1,250 mg in sodium chloride 0.9 % 250 mL IVPB  1,250 mg Intravenous Q12H Vena Rua, RPH   1,250 mg at 08/06/15 [id number]    OBJECTIVE: Filed Vitals:   07/26/15 0946  BP: 127/78  Pulse: 97  Temp: 97.1 F (36.2 C)  Resp: 18       Body mass index is 28.47 kg/(m^2).    ECOG FS:0 - Asymptomatic  General: Well-developed, well-nourished, no acute distress. Eyes: Pink conjunctiva, anicteric sclera. Lungs: Clear to auscultation bilaterally. Heart: Regular rate and rhythm. No rubs, murmurs, or gallops. Abdomen: Soft, nontender, nondistended. No organomegaly noted, normoactive bowel sounds. Musculoskeletal: No edema, cyanosis, or clubbing. Neuro: Alert, answering all questions appropriately. Cranial nerves grossly intact. Skin: No rashes or petechiae noted. Psych: Normal affect. Lymphatics: No palpable lymphadenopathy.   LAB RESULTS:  Lab Results  Component Value Date   NA 136 08/05/2015   K 3.6 08/05/2015   CL 104 08/05/2015   CO2 27 08/05/2015   GLUCOSE 112* 08/05/2015   BUN 13 08/05/2015   CREATININE 0.80 08/05/2015   CALCIUM 8.6* 08/05/2015   PROT 6.1* 08/04/2015   ALBUMIN 3.5 08/04/2015   AST 185* 08/04/2015   ALT 280* 08/04/2015   ALKPHOS 490* 08/04/2015   BILITOT 0.9 08/04/2015   GFRNONAA >60 08/05/2015   GFRAA >60 08/05/2015    Lab Results  Component Value Date   WBC 24.8* 08/06/2015   NEUTROABS 9.6* 08/04/2015   HGB 7.6* 08/06/2015   HCT 24.0* 08/06/2015   MCV 90.3 08/06/2015   PLT 115* 08/06/2015     STUDIES: Dg Chest 2 View  08/04/2015  CLINICAL DATA:  Patient brought in by [facility], EMS was called out for fall and possible seizure like activity with incontience. Patient has temp of 102.6 per EMS. Heart rate 140, BP 156/82, CBG 164. Patient has a hx/o lymphoma, last chemo treatment was Thursday. EXAM: CHEST  2 VIEW COMPARISON:  12/10/2012 FINDINGS: Lung volumes are low leading to crowding of the bronchovascular structures. There is no lung consolidation to suggest lobar pneumonia and no convincing pulmonary edema. No pleural effusion or pneumothorax. Cardiac silhouette is top-normal in size. No mediastinal or hilar masses or convincing adenopathy. Right anterior chest wall Port-A-Cath has  its tip at the caval atrial junction, new since the prior study. Bony thorax is demineralized but intact. IMPRESSION: No acute cardiopulmonary disease. Electronically Signed   By: Lajean Manes M.D.   On: 08/04/2015 12:46   Ct Head Wo Contrast  08/04/2015  CLINICAL DATA:  Fall, possible seizure. EXAM: CT HEAD WITHOUT CONTRAST TECHNIQUE: Contiguous axial images were obtained from the base of the skull through the vertex without intravenous contrast. COMPARISON:  None. FINDINGS: Bony calvarium appears intact. Minimal diffuse cortical atrophy is noted. Mild chronic ischemic white matter disease is noted. No mass effect or midline shift is noted. Ventricular size is within normal limits. There is no evidence of mass lesion, hemorrhage or acute infarction. IMPRESSION: Minimal diffuse cortical atrophy. Mild chronic ischemic white matter disease. No acute intracranial abnormality seen. Electronically Signed   By: Marijo Conception, M.D.   On: 08/04/2015 12:49  Nm Pet Image Restag (ps) Skull Base To Thigh  07/24/2015  CLINICAL DATA:  Subsequent treatment strategy for T-cell lymphoma. EXAM: NUCLEAR MEDICINE PET SKULL BASE TO THIGH TECHNIQUE: 12.26 mCi F-18 FDG was injected intravenously. Full-ring PET imaging was performed from the skull base to thigh after the radiotracer. CT data was obtained and used for attenuation correction and anatomic localization. FASTING BLOOD GLUCOSE:  Value: 101 mg/dl COMPARISON:  04/28/2015 FINDINGS: NECK No hypermetabolic lymph nodes in the neck. CHEST Index left axillary lymph node measure 1.3 cm and has an SUV max equal 2.04. Previously this measured 1.5 cm and had an SUV max equal to 3.79. The index right axillary node measures 1.5 cm and has an SUV max equal to 2.47. On the previous exam this measure 2.3 cm and had an SUV max equal to 3.79. Cardiac enlargement is again identified. There is aortic atherosclerosis as well as LAD coronary artery and RCA calcification. The trachea appears  patent and is midline. Normal appearance of the esophagus. The index AP window lymph node measures 1.2 cm and has an SUV max equal to 2.38. On the previous exam this measured 1.2 cm and had an SUV max equal to 4.14. Right paratracheal lymph node currently measures 1.2 cm and had an SUV max equal to 3.9. Previously this measured 1.3 cm and had an SUV max equal to 4.0. Right hilar node measures 1.4 cm and has an SUV max equal to 4.35. On the previous exam this measured 1.6 cm and had an SUV max equal to 4.55. No pleural fluid identified. No hypermetabolic pulmonary nodules or masses. ABDOMEN/PELVIS No abnormal hypermetabolic activity within the liver, pancreas, adrenal glands, or spleen. Bilateral renal cysts are identified. There is aortic atherosclerosis noted. Index gastrohepatic ligament lymph node measures 9 mm and has an SUV max equal to 3.06. On the previous exam this measured 1 cm and had an SUV max equal to 3.8. The index aortocaval lymph node measures 7 mm and has an SUV max equal to 1.85. Previously this measured 1 cm and had an SUV max equal to 3.3. Enlarged and hypermetabolic iliac and bilateral inguinal lymph nodes appear improved from previous exam. Index right external iliac node measure 9 mm and has an SUV max equal to 1.88. Previously this measured 1.4 cm and had an SUV max equal to 3.88. Index right inguinal lymph node Measures 1.8 cm and has an SUV max equal to 1.56. On the previous exam this measured 2 cm and had an SUV max equal to 2.8. The left inguinal lymph node Measures 1.1 cm and has an SUV max equal to 1.46. Previously this measured 1.3 cm and had an SUV max equal to 3.1. SKELETON Again noted is mild diffuse increased radiotracer uptake throughout the axial and appendicular skeleton. IMPRESSION: 1. Overall there has been an interval decrease in size and degree of FDG uptake associated with hypermetabolic axillary, mediastinal, hilar, retroperitoneal and pelvic adenopathy. Electronically  Signed   By: Kerby Moors M.D.   On: 07/24/2015 11:43    ASSESSMENT: Recurrent stage IV T-cell lymphoma.  PLAN:    1. T-cell lymphoma: PET scan results from July 24, 2015 reviewed independently likely residual disease. Previously, his disease with CD30 positive, it is now CD30 negative. After lengthy discussion with patient and his wife, he would like to proceed with treatment. He has now reached his lifetime dose of Adriamycin and this will be dropped from his regimen. Proceed with cycle 5 of CVP plus etoposide  today. Continue with Onpro Neulasta on day 3 of treatment. Return to clinic in 1 and 2 days for etoposide only and then in 3 weeks for consideration of cycle 6. Will reimage with PET in approximately February 2017.  2. Leukocytosis: Secondary to OnPro Neulasta as above.  Patient expressed understanding and was in agreement with this plan. He also understands that He can call clinic at any time with any questions, concerns, or complaints.   Lloyd Huger, MD   08/06/2015 9:26 AM

## 2015-08-06 NOTE — Progress Notes (Signed)
[place] [facility] [place] NAME: Erik Shelton    MR#:  [id number]  DATE OF BIRTH:  1941-07-15  SUBJECTIVE:  CHIEF COMPLAINT:   Chief Complaint  Patient presents with  . Fall  . Fever    No fever in last 24 hours. No complains.  sitting in chair. Tolerating diet, no aches , pains.  REVIEW OF SYSTEMS:  CONSTITUTIONAL: Negative for fever and fatigue or weakness.  EYES: No blurred or double vision.  EARS, NOSE, AND THROAT: No tinnitus or ear pain.  RESPIRATORY: No cough, shortness of breath, wheezing or hemoptysis.  CARDIOVASCULAR: No chest pain, orthopnea, edema.  GASTROINTESTINAL: No nausea, vomiting, diarrhea or abdominal pain.  GENITOURINARY: No dysuria, hematuria. had one episode of urinary incontinence ENDOCRINE: No polyuria, nocturia,  HEMATOLOGY: No anemia, easy bruising or bleeding SKIN: No rash or lesion. MUSCULOSKELETAL: No joint pain or arthritis.  NEUROLOGIC: No tingling, numbness, weakness.  PSYCHIATRY: No anxiety or depression.   ROS  DRUG ALLERGIES:   Allergies  Allergen Reactions  . No Known Allergies     VITALS:  Blood pressure 127/71, pulse 89, temperature 99.2 F (37.3 C), temperature source Oral, resp. rate 18, height 5\' 8"  (1.727 m), weight 90.493 kg (199 lb 8 oz), SpO2 99 %.  PHYSICAL EXAMINATION:   GENERAL: 74 y.o.-year-old patient sitting in chair with no acute distress.  EYES: Pupils equal, round, reactive to light and accommodation. No scleral icterus. Extraocular muscles intact.  HEENT: Head atraumatic, normocephalic. Oropharynx and nasopharynx clear.  NECK: Supple, no jugular venous distention. No thyroid enlargement, no tenderness.  LUNGS: Normal breath sounds bilaterally, no wheezing, rales,rhonchi or crepitation. No use of accessory muscles of respiration. Right upper chest MediPort present. CARDIOVASCULAR: S1, S2 normal. No murmurs, rubs, or gallops.  ABDOMEN: Soft,  nontender, nondistended. Bowel sounds present. No organomegaly or mass.  EXTREMITIES: No pedal edema, cyanosis, or clubbing.  NEUROLOGIC: Cranial nerves II through XII are intact. Muscle strength 5/5 in all extremities. Sensation intact. Gait not checked.  PSYCHIATRIC: The patient is alert and oriented x 3.  SKIN: No obvious rash, lesion, or ulcer.   Physical Exam LABORATORY PANEL:   CBC  Recent Labs Lab 08/06/15 0543  WBC 24.8*  HGB 7.6*  HCT 24.0*  PLT 115*   ------------------------------------------------------------------------------------------------------------------  Chemistries   Recent Labs Lab 08/04/15 1109 08/05/15 0343  NA 132* 136  K 3.8 3.6  CL 100* 104  CO2 27 27  GLUCOSE 119* 112*  BUN 15 13  CREATININE 0.74 0.80  CALCIUM 8.8* 8.6*  AST 185*  --   ALT 280*  --   ALKPHOS 490*  --   BILITOT 0.9  --    ------------------------------------------------------------------------------------------------------------------  Cardiac Enzymes  Recent Labs Lab 08/04/15 1109  TROPONINI 0.33*   ------------------------------------------------------------------------------------------------------------------  RADIOLOGY:  Dg Chest 2 View  08/04/2015  CLINICAL DATA:  Patient brought in by [facility], EMS was called out for fall and possible seizure like activity with incontience. Patient has temp of 102.6 per EMS. Heart rate 140, BP 156/82, CBG 164. Patient has a hx/o lymphoma, last chemo treatment was Thursday. EXAM: CHEST  2 VIEW COMPARISON:  12/10/2012 FINDINGS: Lung volumes are low leading to crowding of the bronchovascular structures. There is no lung consolidation to suggest lobar pneumonia and no convincing pulmonary edema. No pleural effusion or pneumothorax. Cardiac silhouette is top-normal in size. No mediastinal or hilar masses or convincing adenopathy. Right anterior chest wall Port-A-Cath has its tip at the  caval atrial junction, new since the prior  study. Bony thorax is demineralized but intact. IMPRESSION: No acute cardiopulmonary disease. Electronically Signed   By: Lajean Manes M.D.   On: 08/04/2015 12:46   Ct Head Wo Contrast  08/04/2015  CLINICAL DATA:  Fall, possible seizure. EXAM: CT HEAD WITHOUT CONTRAST TECHNIQUE: Contiguous axial images were obtained from the base of the skull through the vertex without intravenous contrast. COMPARISON:  None. FINDINGS: Bony calvarium appears intact. Minimal diffuse cortical atrophy is noted. Mild chronic ischemic white matter disease is noted. No mass effect or midline shift is noted. Ventricular size is within normal limits. There is no evidence of mass lesion, hemorrhage or acute infarction. IMPRESSION: Minimal diffuse cortical atrophy. Mild chronic ischemic white matter disease. No acute intracranial abnormality seen. Electronically Signed   By: Marijo Conception, M.D.   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 Urine culture and blood cultures are sent.- negative so far. X-ray chest and urinalysis are negative. No gross cellulitis or any other skin lesions. Influenza test is sent- negative.   WBCs are still rising.  * Anemia and thrombocytopenia   Likely due to result of chemo and Lymphoma.   Will discuss with Oncology for any further work ups needed.  * Hypertension Continue losartan  * History of stroke Continue aspirin and statin.  * Chronic cancer related pain He is on oxycodone at home, continue the same  * Esophageal reflux disease Continue PPI.  All the records are reviewed and case discussed  with Care Management/Social Workerr. Management plans discussed with the patient, family and they are in agreement.  CODE STATUS: full  TOTAL TIME TAKING CARE OF THIS PATIENT: 35 minutes.    POSSIBLE D/C IN 1-2 DAYS, DEPENDING ON CLINICAL CONDITION.   Vaughan Basta M.D on 08/06/2015   Between 7am to 6pm - Pager - [phone]  After 6pm go to [email or web address] - password [email or web address] [facility]  Office  [phone]  CC: Primary care physician; [name]., MD  Note: This dictation was prepared with Dragon dictation along with smaller phrase technology. Any transcriptional errors that result from this process are unintentional.

## 2015-08-06 NOTE — Progress Notes (Signed)
Alert and oriented, ambulatory in room, good appetite, denies pain, on room air, wife at bedside, hgb and platelets remain decreased but stable, receiving IV antibiotics, afebrile throughout shift, vital signs stable. Report passed on at 15:00.

## 2015-08-06 NOTE — Progress Notes (Signed)
Calian Horng   DOB:[birth year]   [id number]    Subjective: Patient sitting in a chair. Appears nontoxic. No fevers overnight. No cough no chills. No nausea no vomiting diarrhea. Overall he feels improved since admitted to the hospital.  ROS: No chest pain or shortness of breath.  Objective:  Filed Vitals:   08/06/15 0526  BP: 127/71  Pulse: 89  Temp: 99.2 F (37.3 C)  Resp: 18     Intake/Output Summary (Last 24 hours) at 08/06/15 0923 Last data filed at 08/06/15 0901  Gross per 24 hour  Intake   1210 ml  Output   1975 ml  Net   -765 ml   GENERAL: Well-nourished well-developed; Alert, no distress and comfortable. Accompanied by his wife. He is sitting in a chair. EYES: no pallor or icterus OROPHARYNX: no thrush or ulceration; good dentition  NECK: supple, no masses felt LYMPH: no palpable lymphadenopathy in the cervical; positive for axillary adenopathy in the right underarm. LUNGS: clear to auscultation and No wheeze or crackles HEART/CVS: regular rate & rhythm and no murmurs; No lower extremity edema ABDOMEN: abdomen soft, non-tender and normal bowel sounds Musculoskeletal:no cyanosis of digits and no clubbing  PSYCH: alert & oriented x 3 with fluent speech NEURO: no focal motor/sensory deficits SKIN: no rashes or significant lesions   Labs:  Lab Results  Component Value Date   WBC 24.8* 08/06/2015   HGB 7.6* 08/06/2015   HCT 24.0* 08/06/2015   MCV 90.3 08/06/2015   PLT 115* 08/06/2015   NEUTROABS 9.6* 08/04/2015    Lab Results  Component Value Date   NA 136 08/05/2015   K 3.6 08/05/2015   CL 104 08/05/2015   CO2 27 08/05/2015    Studies:  Dg Chest 2 View  08/04/2015  CLINICAL DATA:  Patient brought in by [facility], EMS was called out for fall and possible seizure like activity with incontience. Patient has temp of 102.6 per EMS. Heart rate 140, BP 156/82, CBG 164. Patient has a hx/o lymphoma, last chemo treatment was Thursday. EXAM: CHEST  2 VIEW  COMPARISON:  12/10/2012 FINDINGS: Lung volumes are low leading to crowding of the bronchovascular structures. There is no lung consolidation to suggest lobar pneumonia and no convincing pulmonary edema. No pleural effusion or pneumothorax. Cardiac silhouette is top-normal in size. No mediastinal or hilar masses or convincing adenopathy. Right anterior chest wall Port-A-Cath has its tip at the caval atrial junction, new since the prior study. Bony thorax is demineralized but intact. IMPRESSION: No acute cardiopulmonary disease. Electronically Signed   By: Lajean Manes M.D.   On: 08/04/2015 12:46   Ct Head Wo Contrast  08/04/2015  CLINICAL DATA:  Fall, possible seizure. EXAM: CT HEAD WITHOUT CONTRAST TECHNIQUE: Contiguous axial images were obtained from the base of the skull through the vertex without intravenous contrast. COMPARISON:  None. FINDINGS: Bony calvarium appears intact. Minimal diffuse cortical atrophy is noted. Mild chronic ischemic white matter disease is noted. No mass effect or midline shift is noted. Ventricular size is within normal limits. There is no evidence of mass lesion, hemorrhage or acute infarction. IMPRESSION: Minimal diffuse cortical atrophy. Mild chronic ischemic white matter disease. No acute intracranial abnormality seen. Electronically Signed   By: Marijo Conception, M.D.   On: 08/04/2015 12:49    Assessment & Plan:   74 year old male patient with above history of peripheral T-cell lymphoma NOS stage IV- currently on chemotherapy with CHOEP status post cycle #5 day #11 currently admitted to the hospital  for fevers.  # Fever- on chemotherapy/lymphoma. Immunocompromise state. On IV vancomycin and Zosyn. Source of infection unclear. Fevers have resolved.   # Peripheral T-cell lymphoma- on chemotherapy cycle #5 day #11; status post Neulasta; patient had partial response on a follow-up PET scan after 4 treatments  # Leukocytosis-unreliable marker of inflammatory state as  patient received Neulasta; patient's white count is up from yesterday at 24,000  Recommendations:   # Agree with medical management with IV antibiotics. Monitor closely. If patient continues to be afebrile; clinically improving it could potentially be discharged for the next one to 2 days on oral antibiotics with Levaquin  # As patient is clinically improving; nothing patient's leukocytosis is likely from his Neulasta.  # Anemia hemoglobin 7.8- if continues to get worse tomorrow recommend 2 units of PRBC   The above plan was discussed with Dr.Vacchani.    Cammie Sickle, MD 08/06/2015  9:23 AM

## 2015-08-06 NOTE — Progress Notes (Signed)
Assumed care at 1500, pt alert and oriented. No complaints of pain or discomfort. Remains on IV antibiotics. Will continue to monitor.

## 2015-08-07 LAB — CREATININE, SERUM
Creatinine, Ser: 0.8 mg/dL (ref 0.61–1.24)
GFR calc Af Amer: >60 mL/min (ref >60)
GFR calc non Af Amer: >60 mL/min (ref >60)

## 2015-08-07 LAB — CBC
HCT: 24.9 % — ABNORMAL LOW (ref 40.0–52.0)
Hemoglobin: 7.9 g/dL — ABNORMAL LOW (ref 13.0–18.0)
MCH: 28.4 pg (ref 26.0–34.0)
MCHC: 31.5 g/dL — ABNORMAL LOW (ref 32.0–36.0)
MCV: 90.2 fL (ref 80.0–100.0)
PLATELETS: 135 10*3/uL — AB (ref 150–440)
RBC: 2.76 MIL/uL — ABNORMAL LOW (ref 4.40–5.90)
RDW: 19.5 % — AB (ref 11.5–14.5)
WBC: 23.8 10*3/uL — ABNORMAL HIGH (ref 3.8–10.6)

## 2015-08-07 MED ORDER — LEVOFLOXACIN 750 MG PO TABS
750.0000 mg | ORAL_TABLET | Freq: Every day | ORAL | Status: AC
Start: 2015-08-07 — End: 2015-08-13

## 2015-08-07 MED ORDER — HEPARIN SOD (PORK) LOCK FLUSH 10 UNIT/ML IV SOLN
10.0000 [IU] | Freq: Once | INTRAVENOUS | Status: AC
  Administered 2015-08-07: 12:00:00 50 [IU] via INTRAVENOUS
  Filled 2015-08-07: qty 1

## 2015-08-07 NOTE — Progress Notes (Signed)
Erik Shelton   DOB:1941-03-26   [id number]    Subjective: Patient sitting in in the bed eating his breakfast  Appears nontoxic. He continues to deny any fever chills. Appetite is improving. No cough. Patient wants to go home.  ROS: No chest pain or shortness of breath.  Objective:  Filed Vitals:   08/06/15 2110 08/07/15 0514  BP: 121/62 118/61  Pulse: 100 85  Temp: 98 F (36.7 C) 97.9 F (36.6 C)  Resp: 20 19     Intake/Output Summary (Last 24 hours) at 08/07/15 0828 Last data filed at 08/07/15 0422  Gross per 24 hour  Intake   1420 ml  Output   1550 ml  Net   -130 ml   GENERAL: Well-nourished well-developed; Alert, no distress and comfortable. Accompanied by his wife. He is sitting in in the bed EYES: no pallor or icterus OROPHARYNX: no thrush or ulceration; good dentition  NECK: supple, no masses felt LYMPH: no palpable lymphadenopathy in the cervical; positive for axillary adenopathy in the right underarm. LUNGS: clear to auscultation and No wheeze or crackles HEART/CVS: regular rate & rhythm and no murmurs; No lower extremity edema ABDOMEN: abdomen soft, non-tender and normal bowel sounds Musculoskeletal:no cyanosis of digits and no clubbing  PSYCH: alert & oriented x 3 with fluent speech NEURO: no focal motor/sensory deficits SKIN: no rashes or significant lesions   Labs:  Lab Results  Component Value Date   WBC 23.8* 08/07/2015   HGB 7.9* 08/07/2015   HCT 24.9* 08/07/2015   MCV 90.2 08/07/2015   PLT 135* 08/07/2015   NEUTROABS 9.6* 08/04/2015    Lab Results  Component Value Date   NA 136 08/05/2015   K 3.6 08/05/2015   CL 104 08/05/2015   CO2 27 08/05/2015    Studies:  No results found.  Assessment & Plan:   74 year old male patient with above history of peripheral T-cell lymphoma NOS stage IV- currently on chemotherapy with CHOEP status post cycle #5 day #12 currently admitted to the hospital for fevers.  # Fever- on  chemotherapy/lymphoma. Immunocompromise state. On IV vancomycin and Zosyn. Source of infection unclear. Fevers have resolved.   # Peripheral T-cell lymphoma- on chemotherapy cycle #5 day #12; status post Neulasta; patient had partial response on a follow-up PET scan after 4 treatments  # Leukocytosis-unreliable marker of inflammatory state as patient received Neulasta; patient's white count fairly stable around 23,000 Recommendations:   # As the patient is afebrile/appears nontoxic clinically improving; I think patient could potentially be discharged home today if okay with hospitalist service. He could be discharged on Levaquin 500 mg once a day for a total of 7 days. Patient could get a CBC/CMP in 1 week; patient has an appointment with Dr. Grayland Shelton November 30 for chemotherapy/M.D. Visit [as per pt].  # Anemia hemoglobin 7.8--fairly stable he could be discharged home. He/wife agreed with the plan   The above plan was discussed with Dr.Vacchani.    Erik Sickle, MD 08/07/2015  8:28 AM

## 2015-08-07 NOTE — Care Management Important Message (Signed)
Important Message  Patient Details  Name: Erik Shelton MRN: [id number] Date of Birth: 28-Oct-1940   Medicare Important Message Given:  Yes    Juliann Pulse A [name] 08/07/2015, 10:52 AM

## 2015-08-07 NOTE — Progress Notes (Signed)
Patient discharged home per MD order. Prescription given to patient. All discharge instructions given and all questions answered. 

## 2015-08-07 NOTE — Plan of Care (Signed)
Problem: Safety: Goal: Ability to remain free from injury will improve Outcome: Progressing Pt alert and oriented. No c/o pain nor distress noted. VSS. IV antibiotics. Continue to monitor.

## 2015-08-07 NOTE — Discharge Instructions (Signed)
Activity as tolerated Diet healthy heart Follow-up with primary care physician in a week Follow-up with oncology Dr. Grayland Ormond in 5-7 days or as recommended by oncology

## 2015-08-09 ENCOUNTER — Other Ambulatory Visit: Payer: Self-pay | Admitting: *Deleted

## 2015-08-09 DIAGNOSIS — C8448 Peripheral T-cell lymphoma, not classified, lymph nodes of multiple sites: Secondary | ICD-10-CM

## 2015-08-09 LAB — CULTURE, BLOOD (ROUTINE X 2)
CULTURE: NO GROWTH
Culture: NO GROWTH

## 2015-08-13 NOTE — Discharge Summary (Signed)
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 Stroke ([place])   . Seizures ([place])   . Hypercholesteremia   . HTN (hypertension)   . Cancer ([place])     T-Cell Lymphoma  . Seizures ([place])     HOSPITAL COURSE:   * Sepsis Today pt is non toxic and clinically feling fine, d/c home with po levofloxacin for 7 days per onc recommendations Met criteria with elevated white cell count, tachycardia, fever at the time of admission The source is unknown, as patient is immunocompromised because of T-cell lymphoma and being on chemotherapy- empirically treated with  broad-spectrum antibiotics iv vanc and zosyn during hospital course Urine culture and blood cultures are  NTD X-ray chest and urinalysis are negative. No gross cellulitis or any other skin lesions. Influenza test is sent- negative.  OP F/u with dr.Finnegan on 11/30 for chemo /F/U visit , rpt cbc/cmp in a week   * Anemia and thrombocytopenia  Likely due to result of chemo and Lymphoma.    * Hypertension Continue losartan  * History of stroke Continue aspirin and statin.  * Chronic cancer related pain  continue his home med- oxycodone  * Esophageal reflux disease Continue PPI.  DISCHARGE CONDITIONS:   fair  CONSULTS OBTAINED:  Treatment Team:  Cammie Sickle, MD Nicholes Mango, MD   PROCEDURES none  DRUG ALLERGIES:   Allergies   Allergen Reactions  . No Known Allergies     DISCHARGE MEDICATIONS:   Discharge Medication List as of 08/07/2015 11:26 AM    START taking these medications   Details  levofloxacin (LEVAQUIN) 750 MG tablet Take 1 tablet (750 mg total) by mouth daily., Starting 08/07/2015, Until Sun 08/13/15, Print      CONTINUE these medications which have NOT CHANGED   Details  acetaminophen (TYLENOL) 325 MG tablet Take 650 mg by mouth every 6 (six) hours as needed for mild pain, moderate pain, fever or headache., Until Discontinued, Historical Med    aspirin 81 MG tablet Take 81 mg by mouth daily., Until Discontinued, Historical Med    lidocaine-prilocaine (EMLA) cream Apply 1 application topically as needed., Starting 05/03/2015, Until Discontinued, Normal    losartan (COZAAR) 50 MG tablet Take 50 mg by mouth daily., Until Discontinued, Historical Med    omeprazole (PRILOSEC) 40 MG capsule Take 1 capsule (40 mg total) by mouth daily., Starting 07/26/2015, Until Discontinued, Normal    pravastatin (PRAVACHOL) 80 MG tablet Take 80 mg by mouth daily., Until Discontinued, Historical Med    predniSONE (DELTASONE) 20 MG tablet Take 4.5 tablets (76m) daily on days 1 - 5 of chemotherapy, Normal    senna-docusate (SENOKOT-S) 8.6-50 MG per tablet Take 1 tablet by mouth at bedtime as needed for mild constipation., Starting 04/15/2015, Until Discontinued, Normal    ondansetron (ZOFRAN) 4 MG tablet Take 1-2 tablets (4-8 mg total) by mouth every 8 (eight) hours as needed for nausea (not  responsive to prochlorperazine (COMPAZINE))., Starting 04/15/2015, Until Discontinued, Normal    oxyCODONE-acetaminophen (ROXICET) 5-325 MG per tablet Take 1 tablet by mouth every 4 (four) hours as needed for severe pain., Starting 05/04/2015, Until Discontinued, Print         DISCHARGE INSTRUCTIONS:   F/u with dr.Finnegan on 11/30   DIET:  AHA  DISCHARGE CONDITION:  FAIR  ACTIVITY:  As toleated  OXYGEN:  Home  Oxygen: no   Oxygen Delivery: none  DISCHARGE LOCATION:  Home  If you experience worsening of your admission symptoms, develop shortness of breath, life threatening emergency, suicidal or homicidal thoughts you must seek medical attention immediately by calling 911 or calling your MD immediately  if symptoms less severe.  You Must read complete instructions/literature along with all the possible adverse reactions/side effects for all the Medicines you take and that have been prescribed to you. Take any new Medicines after you have completely understood and accpet all the possible adverse reactions/side effects.   Please note  You were cared for by a hospitalist during your hospital stay. If you have any questions about your discharge medications or the care you received while you were in the hospital after you are discharged, you can call the unit and asked to speak with the hospitalist on call if the hospitalist that took care of you is not available. Once you are discharged, your primary care physician will handle any further medical issues. Please note that NO REFILLS for any discharge medications will be authorized once you are discharged, as it is imperative that you return to your primary care physician (or establish a relationship with a primary care physician if you do not have one) for your aftercare needs so that they can reassess your need for medications and monitor your lab values.     Today  Chief Complaint  Patient presents with  . Fall  . Fever   Pt is feeling fine , no fever for the past 24 hrs, no complaints  ROS:  CONSTITUTIONAL: Denies fevers, chills. Denies any fatigue, weakness.  EYES: Denies blurry vision, double vision, eye pain. EARS, NOSE, THROAT: Denies tinnitus, ear pain, hearing loss. RESPIRATORY: Denies cough, wheeze, shortness of breath.  CARDIOVASCULAR: Denies chest pain, palpitations, edema.  GASTROINTESTINAL: Denies nausea, vomiting, diarrhea,  abdominal pain. Denies bright red blood per rectum. GENITOURINARY: Denies dysuria, hematuria. ENDOCRINE: Denies nocturia or thyroid problems. HEMATOLOGIC AND LYMPHATIC: Denies easy bruising or bleeding. SKIN: Denies rash or lesion. MUSCULOSKELETAL: Denies pain in neck, back, shoulder, knees, hips or arthritic symptoms.  NEUROLOGIC: Denies paralysis, paresthesias.  PSYCHIATRIC: Denies anxiety or depressive symptoms.   VITAL SIGNS:  Blood pressure 118/61, pulse 85, temperature 97.9 F (36.6 C), temperature source Oral, resp. rate 19, height 5' 8" (1.727 m), weight 90.493 kg (199 lb 8 oz), SpO2 100 %.  I/O:  No intake or output data in the 24 hours ending 08/13/15 2104  PHYSICAL EXAMINATION:  GENERAL:  74 y.o.-year-old patient lying in the bed with no acute distress.  EYES: Pupils equal, round, reactive to light and accommodation. No scleral icterus. Extraocular muscles intact.  HEENT: Head atraumatic, normocephalic. Oropharynx and nasopharynx clear.  NECK:  Supple, no jugular venous distention. No thyroid enlargement, no tenderness.  LUNGS: Normal breath sounds bilaterally, no wheezing, rales,rhonchi or crepitation. No use of accessory muscles of respiration.  CARDIOVASCULAR: S1, S2 normal. No murmurs, rubs, or gallops.  ABDOMEN: Soft, non-tender, non-distended. Bowel sounds present. No organomegaly or mass.  EXTREMITIES: No pedal edema, cyanosis,  or clubbing.  NEUROLOGIC: Cranial nerves II through XII are intact. Muscle strength 5/5 in all extremities. Sensation intact. Gait not checked.  PSYCHIATRIC: The patient is alert and oriented x 3.  SKIN: No obvious rash, lesion, or ulcer.   DATA REVIEW:   CBC  Recent Labs Lab 08/07/15 0535  WBC 23.8*  HGB 7.9*  HCT 24.9*  PLT 135*    Chemistries   Recent Labs Lab 08/07/15 0535  CREATININE 0.80    Cardiac Enzymes No results for input(s): TROPONINI in the last 168 hours.  Microbiology Results  Results for orders placed or  performed during the hospital encounter of 08/04/15  Blood Culture (routine x 2)     Status: None   Collection Time: 08/04/15 11:05 AM  Result Value Ref Range Status   Specimen Description BLOOD LEFT PORTA CATH  Final   Special Requests BOTTLES DRAWN AEROBIC AND ANAEROBIC  [place]  Final   Culture NO GROWTH 5 DAYS  Final   Report Status 08/09/2015 FINAL  Final  Urine culture     Status: None   Collection Time: 08/04/15 11:09 AM  Result Value Ref Range Status   Specimen Description URINE, RANDOM  Final   Special Requests NONE  Final   Culture NO GROWTH 2 DAYS  Final   Report Status 08/06/2015 FINAL  Final  Blood Culture (routine x 2)     Status: None   Collection Time: 08/04/15 11:10 AM  Result Value Ref Range Status   Specimen Description BLOOD RIGHT AC  Final   Special Requests   Final    BOTTLES DRAWN AEROBIC AND ANAEROBIC  AER 6CC ANA 2CC   Culture NO GROWTH 5 DAYS  Final   Report Status 08/09/2015 FINAL  Final    RADIOLOGY:  No results found.  EKG:   Orders placed or performed during the hospital encounter of 08/04/15  . ED EKG 12-Lead  . ED EKG 12-Lead      Management plans discussed with the patient, family and they are in agreement.  CODE STATUS:   TOTAL TIME TAKING CARE OF THIS PATIENT: 45 minutes.    _0 @  on 08/13/2015 at 9:04 PM  Between 7am to 6pm - Pager - [phone]  After 6pm go to [email or web address] - password [email or web address] [facility]  Office  [phone]  CC: Primary care physician; [name]., MD

## 2015-08-14 ENCOUNTER — Inpatient Hospital Stay: Payer: Medicare Other

## 2015-08-14 ENCOUNTER — Inpatient Hospital Stay (HOSPITAL_BASED_OUTPATIENT_CLINIC_OR_DEPARTMENT_OTHER): Payer: Medicare Other | Admitting: Oncology

## 2015-08-14 VITALS — BP 134/76 | HR 112 | Temp 98.1°F | Resp 18 | Wt 191.4 lb

## 2015-08-14 DIAGNOSIS — Z9181 History of falling: Secondary | ICD-10-CM

## 2015-08-14 DIAGNOSIS — Z8673 Personal history of transient ischemic attack (TIA), and cerebral infarction without residual deficits: Secondary | ICD-10-CM | POA: Diagnosis not present

## 2015-08-14 DIAGNOSIS — E7800 Pure hypercholesterolemia, unspecified: Secondary | ICD-10-CM

## 2015-08-14 DIAGNOSIS — C8448 Peripheral T-cell lymphoma, not classified, lymph nodes of multiple sites: Secondary | ICD-10-CM | POA: Diagnosis not present

## 2015-08-14 DIAGNOSIS — Z7982 Long term (current) use of aspirin: Secondary | ICD-10-CM | POA: Diagnosis not present

## 2015-08-14 DIAGNOSIS — D6481 Anemia due to antineoplastic chemotherapy: Secondary | ICD-10-CM

## 2015-08-14 DIAGNOSIS — Z87891 Personal history of nicotine dependence: Secondary | ICD-10-CM | POA: Diagnosis not present

## 2015-08-14 DIAGNOSIS — I10 Essential (primary) hypertension: Secondary | ICD-10-CM

## 2015-08-14 DIAGNOSIS — T451X5S Adverse effect of antineoplastic and immunosuppressive drugs, sequela: Secondary | ICD-10-CM

## 2015-08-14 DIAGNOSIS — Z5111 Encounter for antineoplastic chemotherapy: Secondary | ICD-10-CM | POA: Diagnosis not present

## 2015-08-14 DIAGNOSIS — Z418 Encounter for other procedures for purposes other than remedying health state: Secondary | ICD-10-CM | POA: Diagnosis not present

## 2015-08-14 DIAGNOSIS — D72829 Elevated white blood cell count, unspecified: Secondary | ICD-10-CM | POA: Diagnosis not present

## 2015-08-14 DIAGNOSIS — Z79899 Other long term (current) drug therapy: Secondary | ICD-10-CM

## 2015-08-14 LAB — CBC WITH DIFFERENTIAL/PLATELET
Basophils Absolute: 0.1 10*3/uL (ref 0–0.1)
Basophils Relative: 0 %
EOS PCT: 1 %
Eosinophils Absolute: 0.1 10*3/uL (ref 0–0.7)
HEMATOCRIT: 29.5 % — AB (ref 40.0–52.0)
Hemoglobin: 9.4 g/dL — ABNORMAL LOW (ref 13.0–18.0)
LYMPHS ABS: 1.3 10*3/uL (ref 1.0–3.6)
LYMPHS PCT: 9 %
MCH: 29.3 pg (ref 26.0–34.0)
MCHC: 31.8 g/dL — AB (ref 32.0–36.0)
MCV: 92.4 fL (ref 80.0–100.0)
MONO ABS: 0.8 10*3/uL (ref 0.2–1.0)
MONOS PCT: 6 %
NEUTROS ABS: 11.8 10*3/uL — AB (ref 1.4–6.5)
Neutrophils Relative %: 84 %
PLATELETS: 423 10*3/uL (ref 150–440)
RBC: 3.19 MIL/uL — ABNORMAL LOW (ref 4.40–5.90)
RDW: 20.8 % — AB (ref 11.5–14.5)
WBC: 14 10*3/uL — ABNORMAL HIGH (ref 3.8–10.6)

## 2015-08-14 LAB — COMPREHENSIVE METABOLIC PANEL
ALT: 28 U/L (ref 17–63)
AST: 22 U/L (ref 15–41)
Albumin: 3.9 g/dL (ref 3.5–5.0)
Alkaline Phosphatase: 208 U/L — ABNORMAL HIGH (ref 38–126)
Anion gap: 10 (ref 5–15)
BILIRUBIN TOTAL: 0.5 mg/dL (ref 0.3–1.2)
BUN: 21 mg/dL — AB (ref 6–20)
CHLORIDE: 101 mmol/L (ref 101–111)
CO2: 26 mmol/L (ref 22–32)
Calcium: 9.1 mg/dL (ref 8.9–10.3)
Creatinine, Ser: 1.28 mg/dL — ABNORMAL HIGH (ref 0.61–1.24)
GFR, EST NON AFRICAN AMERICAN: 53 mL/min — AB (ref >60)
Glucose, Bld: 110 mg/dL — ABNORMAL HIGH (ref 65–99)
POTASSIUM: 4.4 mmol/L (ref 3.5–5.1)
Sodium: 137 mmol/L (ref 135–145)
TOTAL PROTEIN: 7 g/dL (ref 6.5–8.1)

## 2015-08-14 NOTE — Progress Notes (Signed)
Patient was feeling very weak with fever and was admitted to the hospital for 3 days.  Since discharge his energy has been improving and today is the best he has felt regarding the leg weakness.

## 2015-08-16 ENCOUNTER — Inpatient Hospital Stay: Payer: Medicare Other

## 2015-08-16 ENCOUNTER — Inpatient Hospital Stay: Payer: Medicare Other | Attending: Oncology

## 2015-08-16 ENCOUNTER — Inpatient Hospital Stay: Payer: Medicare Other | Admitting: Oncology

## 2015-08-16 VITALS — BP 114/71 | HR 95 | Temp 96.0°F | Resp 18

## 2015-08-16 DIAGNOSIS — C8448 Peripheral T-cell lymphoma, not classified, lymph nodes of multiple sites: Secondary | ICD-10-CM | POA: Diagnosis not present

## 2015-08-16 MED ORDER — SODIUM CHLORIDE 0.9 % IV SOLN
80.0000 mg/m2 | Freq: Once | INTRAVENOUS | Status: AC
  Administered 2015-08-16: 170 mg via INTRAVENOUS
  Filled 2015-08-16: qty 8.5

## 2015-08-16 MED ORDER — SODIUM CHLORIDE 0.9 % IJ SOLN
10.0000 mL | INTRAMUSCULAR | Status: DC | PRN
  Administered 2015-08-16: 10 mL
  Filled 2015-08-16: qty 10

## 2015-08-16 MED ORDER — VINCRISTINE SULFATE CHEMO INJECTION 1 MG/ML
2.0000 mg | Freq: Once | INTRAVENOUS | Status: AC
  Administered 2015-08-16: 2 mg via INTRAVENOUS
  Filled 2015-08-16: qty 2

## 2015-08-16 MED ORDER — HEPARIN SOD (PORK) LOCK FLUSH 100 UNIT/ML IV SOLN
500.0000 [IU] | Freq: Once | INTRAVENOUS | Status: AC | PRN
Start: 2015-08-16 — End: 2015-08-16
  Administered 2015-08-16: 500 [IU]
  Filled 2015-08-16: qty 5

## 2015-08-16 MED ORDER — SODIUM CHLORIDE 0.9 % IV SOLN
Freq: Once | INTRAVENOUS | Status: AC
  Administered 2015-08-16: 09:00:00 via INTRAVENOUS
  Filled 2015-08-16: qty 1000

## 2015-08-16 MED ORDER — SODIUM CHLORIDE 0.9 % IV SOLN
750.0000 mg/m2 | Freq: Once | INTRAVENOUS | Status: AC
  Administered 2015-08-16: 1620 mg via INTRAVENOUS
  Filled 2015-08-16: qty 50

## 2015-08-16 MED ORDER — SODIUM CHLORIDE 0.9 % IV SOLN
Freq: Once | INTRAVENOUS | Status: AC
  Administered 2015-08-16: 09:00:00 via INTRAVENOUS
  Filled 2015-08-16: qty 8

## 2015-08-17 ENCOUNTER — Inpatient Hospital Stay: Payer: Medicare Other

## 2015-08-17 ENCOUNTER — Inpatient Hospital Stay: Payer: Medicare Other | Attending: Oncology

## 2015-08-17 DIAGNOSIS — Z79899 Other long term (current) drug therapy: Secondary | ICD-10-CM | POA: Insufficient documentation

## 2015-08-17 DIAGNOSIS — D6481 Anemia due to antineoplastic chemotherapy: Secondary | ICD-10-CM | POA: Diagnosis not present

## 2015-08-17 DIAGNOSIS — Z7689 Persons encountering health services in other specified circumstances: Secondary | ICD-10-CM | POA: Diagnosis not present

## 2015-08-17 DIAGNOSIS — Z87891 Personal history of nicotine dependence: Secondary | ICD-10-CM | POA: Diagnosis not present

## 2015-08-17 DIAGNOSIS — M791 Myalgia: Secondary | ICD-10-CM | POA: Insufficient documentation

## 2015-08-17 DIAGNOSIS — T451X5S Adverse effect of antineoplastic and immunosuppressive drugs, sequela: Secondary | ICD-10-CM | POA: Insufficient documentation

## 2015-08-17 DIAGNOSIS — C8448 Peripheral T-cell lymphoma, not classified, lymph nodes of multiple sites: Secondary | ICD-10-CM

## 2015-08-17 DIAGNOSIS — Z7982 Long term (current) use of aspirin: Secondary | ICD-10-CM | POA: Insufficient documentation

## 2015-08-17 DIAGNOSIS — Z5111 Encounter for antineoplastic chemotherapy: Secondary | ICD-10-CM | POA: Diagnosis not present

## 2015-08-17 DIAGNOSIS — E7800 Pure hypercholesterolemia, unspecified: Secondary | ICD-10-CM | POA: Insufficient documentation

## 2015-08-17 DIAGNOSIS — I10 Essential (primary) hypertension: Secondary | ICD-10-CM | POA: Insufficient documentation

## 2015-08-17 DIAGNOSIS — D72828 Other elevated white blood cell count: Secondary | ICD-10-CM | POA: Diagnosis not present

## 2015-08-17 DIAGNOSIS — Z8673 Personal history of transient ischemic attack (TIA), and cerebral infarction without residual deficits: Secondary | ICD-10-CM | POA: Diagnosis not present

## 2015-08-17 MED ORDER — HEPARIN SOD (PORK) LOCK FLUSH 100 UNIT/ML IV SOLN
500.0000 [IU] | Freq: Once | INTRAVENOUS | Status: AC
  Administered 2015-08-17: 500 [IU] via INTRAVENOUS

## 2015-08-17 MED ORDER — SODIUM CHLORIDE 0.9 % IV SOLN
Freq: Once | INTRAVENOUS | Status: AC
  Administered 2015-08-17: 14:00:00 via INTRAVENOUS
  Filled 2015-08-17: qty 4

## 2015-08-17 MED ORDER — HEPARIN SOD (PORK) LOCK FLUSH 100 UNIT/ML IV SOLN
INTRAVENOUS | Status: AC
  Filled 2015-08-17: qty 5

## 2015-08-17 MED ORDER — SODIUM CHLORIDE 0.9 % IV SOLN
INTRAVENOUS | Status: DC
  Administered 2015-08-17: 14:00:00 via INTRAVENOUS
  Filled 2015-08-17: qty 1000

## 2015-08-17 MED ORDER — SODIUM CHLORIDE 0.9 % IV SOLN
80.0000 mg/m2 | Freq: Once | INTRAVENOUS | Status: AC
  Administered 2015-08-17: 170 mg via INTRAVENOUS
  Filled 2015-08-17: qty 8.5

## 2015-08-18 ENCOUNTER — Inpatient Hospital Stay: Payer: Medicare Other

## 2015-08-18 DIAGNOSIS — C8448 Peripheral T-cell lymphoma, not classified, lymph nodes of multiple sites: Secondary | ICD-10-CM | POA: Diagnosis not present

## 2015-08-18 MED ORDER — SODIUM CHLORIDE 0.9 % IV SOLN
INTRAVENOUS | Status: DC
  Administered 2015-08-18: 13:00:00 via INTRAVENOUS
  Filled 2015-08-18: qty 1000

## 2015-08-18 MED ORDER — SODIUM CHLORIDE 0.9 % IV SOLN
Freq: Once | INTRAVENOUS | Status: AC
  Administered 2015-08-18: 13:00:00 via INTRAVENOUS
  Filled 2015-08-18: qty 4

## 2015-08-18 MED ORDER — HEPARIN SOD (PORK) LOCK FLUSH 100 UNIT/ML IV SOLN
500.0000 [IU] | Freq: Once | INTRAVENOUS | Status: AC
  Administered 2015-08-18: 500 [IU] via INTRAVENOUS

## 2015-08-18 MED ORDER — PEGFILGRASTIM 6 MG/0.6ML ~~LOC~~ PSKT
6.0000 mg | PREFILLED_SYRINGE | Freq: Once | SUBCUTANEOUS | Status: AC
  Administered 2015-08-18: 6 mg via SUBCUTANEOUS
  Filled 2015-08-18: qty 0.6

## 2015-08-18 MED ORDER — HEPARIN SOD (PORK) LOCK FLUSH 100 UNIT/ML IV SOLN
INTRAVENOUS | Status: AC
  Filled 2015-08-18: qty 5

## 2015-08-18 MED ORDER — SODIUM CHLORIDE 0.9 % IV SOLN
80.0000 mg/m2 | Freq: Once | INTRAVENOUS | Status: AC
  Administered 2015-08-18: 170 mg via INTRAVENOUS
  Filled 2015-08-18: qty 8.5

## 2015-08-20 NOTE — Progress Notes (Signed)
[place]  [phone] [phone]  ID: Velvet Bathe OB: 09/18/1940  MR#: [id number]  [id number]  Patient Care Team: [name], MD as PCP - General (Internal Medicine)  CHIEF COMPLAINT:  Chief Complaint  Patient presents with  . Lymphoma    INTERVAL HISTORY: Patient returns to clinic today for further evaluation, discussion of his imaging results, and consideration of cycle 6 of etoposide plus CVP. He was admitted to the hospital recently with increasing weakness as well as neutropenic fever. No obvious infectious source was identified. Patient now feels improved and is back to his baseline. He denies any further fevers.  He does not complain of weakness or fatigue today. He does not complain of pain today. His nausea and appetite have improved. He has no chest pain or shortness of breath. He denies any vomiting, constipation, or diarrhea. Patient offers no further specific complaints today.  REVIEW OF SYSTEMS:   Review of Systems  Constitutional: Negative for fever and malaise/fatigue.  Respiratory: Negative.   Cardiovascular: Negative.   Gastrointestinal: Negative for nausea and abdominal pain.  Musculoskeletal: Negative.   Neurological: Negative.  Negative for weakness.  Endo/Heme/Allergies: Does not bruise/bleed easily.    As per HPI. Otherwise, a complete review of systems is negatve.  PAST MEDICAL HISTORY: Past Medical History  Diagnosis Date  . Stroke ([place])   . Seizures ([place])   . Hypercholesteremia   . HTN (hypertension)   . Cancer ([place])     T-Cell Lymphoma  . Seizures ([place])     PAST SURGICAL HISTORY: Past Surgical History  Procedure Laterality Date  . Peripheral vascular catheterization N/A 04/24/2015    Procedure: Glori Luis Cath Insertion;  Surgeon: Algernon Huxley, MD;  Location: [facility];  Service: Cardiovascular;  Laterality: N/A;    FAMILY HISTORY: Reviewed and unchanged. No reported history of  malignancy or chronic disease.     ADVANCED DIRECTIVES:    HEALTH MAINTENANCE: Social History  Substance Use Topics  . Smoking status: Former Research scientist (life sciences)  . Smokeless tobacco: Not on file     Comment: Quit  in 1990  . Alcohol Use: Yes     Comment: Occassional glass of wine     Colonoscopy:  PAP:  Bone density:  Lipid panel:  Allergies  Allergen Reactions  . No Known Allergies     Current Outpatient Prescriptions  Medication Sig Dispense Refill  . acetaminophen (TYLENOL) 325 MG tablet Take 650 mg by mouth every 6 (six) hours as needed for mild pain, moderate pain, fever or headache.    Marland Kitchen aspirin 81 MG tablet Take 81 mg by mouth daily.    Marland Kitchen lidocaine-prilocaine (EMLA) cream Apply 1 application topically as needed. 30 g 2  . losartan (COZAAR) 50 MG tablet Take 50 mg by mouth daily.    Marland Kitchen omeprazole (PRILOSEC) 40 MG capsule Take 1 capsule (40 mg total) by mouth daily. 30 capsule 2  . ondansetron (ZOFRAN) 4 MG tablet Take 1-2 tablets (4-8 mg total) by mouth every 8 (eight) hours as needed for nausea (not responsive to prochlorperazine (COMPAZINE)). 20 tablet 0  . oxyCODONE-acetaminophen (ROXICET) 5-325 MG per tablet Take 1 tablet by mouth every 4 (four) hours as needed for severe pain. 60 tablet 0  . pravastatin (PRAVACHOL) 80 MG tablet Take 80 mg by mouth daily.    . predniSONE (DELTASONE) 20 MG tablet Take 4.5 tablets (90mg ) daily on days 1 - 5 of chemotherapy 25 tablet 6  . senna-docusate (SENOKOT-S)  8.6-50 MG per tablet Take 1 tablet by mouth at bedtime as needed for mild constipation. 30 tablet 2   No current facility-administered medications for this visit.   Facility-Administered Medications Ordered in Other Visits  Medication Dose Route Frequency Provider Last Rate Last Dose  . sodium chloride 0.9 % injection 10 mL  10 mL Intracatheter PRN Lloyd Huger, MD   10 mL at 07/05/15 0900    OBJECTIVE: Filed Vitals:   08/14/15 1524  BP: 134/76  Pulse: 112  Temp: 98.1 F  (36.7 C)  Resp: 18     Body mass index is 29.1 kg/(m^2).    ECOG FS:0 - Asymptomatic  General: Well-developed, well-nourished, no acute distress. Eyes: Pink conjunctiva, anicteric sclera. Lungs: Clear to auscultation bilaterally. Heart: Regular rate and rhythm. No rubs, murmurs, or gallops. Abdomen: Soft, nontender, nondistended. No organomegaly noted, normoactive bowel sounds. Musculoskeletal: No edema, cyanosis, or clubbing. Neuro: Alert, answering all questions appropriately. Cranial nerves grossly intact. Skin: No rashes or petechiae noted. Psych: Normal affect. Lymphatics: No palpable lymphadenopathy.   LAB RESULTS:  Lab Results  Component Value Date   NA 137 08/14/2015   K 4.4 08/14/2015   CL 101 08/14/2015   CO2 26 08/14/2015   GLUCOSE 110* 08/14/2015   BUN 21* 08/14/2015   CREATININE 1.28* 08/14/2015   CALCIUM 9.1 08/14/2015   PROT 7.0 08/14/2015   ALBUMIN 3.9 08/14/2015   AST 22 08/14/2015   ALT 28 08/14/2015   ALKPHOS 208* 08/14/2015   BILITOT 0.5 08/14/2015   GFRNONAA 53* 08/14/2015   GFRAA >60 08/14/2015    Lab Results  Component Value Date   WBC 14.0* 08/14/2015   NEUTROABS 11.8* 08/14/2015   HGB 9.4* 08/14/2015   HCT 29.5* 08/14/2015   MCV 92.4 08/14/2015   PLT 423 08/14/2015     STUDIES: Dg Chest 2 View  08/04/2015  CLINICAL DATA:  Patient brought in by [facility], EMS was called out for fall and possible seizure like activity with incontience. Patient has temp of 102.6 per EMS. Heart rate 140, BP 156/82, CBG 164. Patient has a hx/o lymphoma, last chemo treatment was Thursday. EXAM: CHEST  2 VIEW COMPARISON:  12/10/2012 FINDINGS: Lung volumes are low leading to crowding of the bronchovascular structures. There is no lung consolidation to suggest lobar pneumonia and no convincing pulmonary edema. No pleural effusion or pneumothorax. Cardiac silhouette is top-normal in size. No mediastinal or hilar masses or convincing adenopathy. Right anterior chest wall  Port-A-Cath has its tip at the caval atrial junction, new since the prior study. Bony thorax is demineralized but intact. IMPRESSION: No acute cardiopulmonary disease. Electronically Signed   By: Lajean Manes M.D.   On: 08/04/2015 12:46   Ct Head Wo Contrast  08/04/2015  CLINICAL DATA:  Fall, possible seizure. EXAM: CT HEAD WITHOUT CONTRAST TECHNIQUE: Contiguous axial images were obtained from the base of the skull through the vertex without intravenous contrast. COMPARISON:  None. FINDINGS: Bony calvarium appears intact. Minimal diffuse cortical atrophy is noted. Mild chronic ischemic white matter disease is noted. No mass effect or midline shift is noted. Ventricular size is within normal limits. There is no evidence of mass lesion, hemorrhage or acute infarction. IMPRESSION: Minimal diffuse cortical atrophy. Mild chronic ischemic white matter disease. No acute intracranial abnormality seen. Electronically Signed   By: Marijo Conception, M.D.   On: 08/04/2015 12:49   Nm Pet Image Restag (ps) Skull Base To Thigh  07/24/2015  CLINICAL DATA:  Subsequent treatment strategy  for T-cell lymphoma. EXAM: NUCLEAR MEDICINE PET SKULL BASE TO THIGH TECHNIQUE: 12.26 mCi F-18 FDG was injected intravenously. Full-ring PET imaging was performed from the skull base to thigh after the radiotracer. CT data was obtained and used for attenuation correction and anatomic localization. FASTING BLOOD GLUCOSE:  Value: 101 mg/dl COMPARISON:  04/28/2015 FINDINGS: NECK No hypermetabolic lymph nodes in the neck. CHEST Index left axillary lymph node measure 1.3 cm and has an SUV max equal 2.04. Previously this measured 1.5 cm and had an SUV max equal to 3.79. The index right axillary node measures 1.5 cm and has an SUV max equal to 2.47. On the previous exam this measure 2.3 cm and had an SUV max equal to 3.79. Cardiac enlargement is again identified. There is aortic atherosclerosis as well as LAD coronary artery and RCA calcification. The  trachea appears patent and is midline. Normal appearance of the esophagus. The index AP window lymph node measures 1.2 cm and has an SUV max equal to 2.38. On the previous exam this measured 1.2 cm and had an SUV max equal to 4.14. Right paratracheal lymph node currently measures 1.2 cm and had an SUV max equal to 3.9. Previously this measured 1.3 cm and had an SUV max equal to 4.0. Right hilar node measures 1.4 cm and has an SUV max equal to 4.35. On the previous exam this measured 1.6 cm and had an SUV max equal to 4.55. No pleural fluid identified. No hypermetabolic pulmonary nodules or masses. ABDOMEN/PELVIS No abnormal hypermetabolic activity within the liver, pancreas, adrenal glands, or spleen. Bilateral renal cysts are identified. There is aortic atherosclerosis noted. Index gastrohepatic ligament lymph node measures 9 mm and has an SUV max equal to 3.06. On the previous exam this measured 1 cm and had an SUV max equal to 3.8. The index aortocaval lymph node measures 7 mm and has an SUV max equal to 1.85. Previously this measured 1 cm and had an SUV max equal to 3.3. Enlarged and hypermetabolic iliac and bilateral inguinal lymph nodes appear improved from previous exam. Index right external iliac node measure 9 mm and has an SUV max equal to 1.88. Previously this measured 1.4 cm and had an SUV max equal to 3.88. Index right inguinal lymph node Measures 1.8 cm and has an SUV max equal to 1.56. On the previous exam this measured 2 cm and had an SUV max equal to 2.8. The left inguinal lymph node Measures 1.1 cm and has an SUV max equal to 1.46. Previously this measured 1.3 cm and had an SUV max equal to 3.1. SKELETON Again noted is mild diffuse increased radiotracer uptake throughout the axial and appendicular skeleton. IMPRESSION: 1. Overall there has been an interval decrease in size and degree of FDG uptake associated with hypermetabolic axillary, mediastinal, hilar, retroperitoneal and pelvic adenopathy.  Electronically Signed   By: Kerby Moors M.D.   On: 07/24/2015 11:43    ASSESSMENT: Recurrent stage IV T-cell lymphoma.  PLAN:    1. T-cell lymphoma: PET scan results from July 24, 2015 reviewed independently with likely residual disease. Previously, his disease with CD30 positive, it is now CD30 negative. After lengthy discussion with patient and his wife, he would like to proceed with treatment. He has now reached his lifetime dose of Adriamycin and this will be dropped from his regimen. Proceed with cycle 6 of CVP plus etoposide as scheduled later this week. Continue with Onpro Neulasta on day 3 of treatment. Return to clinic  in 1 and 2 days for etoposide only and then in 3 weeks for consideration of cycle 7. Will reimage with PET in approximately February 2017.  2. Leukocytosis: Secondary to OnPro Neulasta as above. 3. Anemia: Secondary to chemotherapy, monitor. 4. Neutropenic fevers: Resolved. Patient completed empiric course of antibiotics yesterday.  Patient expressed understanding and was in agreement with this plan. He also understands that He can call clinic at any time with any questions, concerns, or complaints.   Lloyd Huger, MD   08/20/2015 8:30 AM

## 2015-08-24 ENCOUNTER — Inpatient Hospital Stay
Admission: EM | Admit: 2015-08-24 | Discharge: 2015-08-26 | DRG: 809 | Disposition: A | Discharge status: Home / Self Care | Payer: Medicare Other | Attending: Internal Medicine | Admitting: Internal Medicine

## 2015-08-24 ENCOUNTER — Emergency Department: Payer: Medicare Other

## 2015-08-24 ENCOUNTER — Encounter: Payer: Self-pay | Admitting: Emergency Medicine

## 2015-08-24 DIAGNOSIS — Z79899 Other long term (current) drug therapy: Secondary | ICD-10-CM

## 2015-08-24 DIAGNOSIS — D709 Neutropenia, unspecified: Secondary | ICD-10-CM

## 2015-08-24 DIAGNOSIS — D638 Anemia in other chronic diseases classified elsewhere: Secondary | ICD-10-CM | POA: Diagnosis present

## 2015-08-24 DIAGNOSIS — Z87891 Personal history of nicotine dependence: Secondary | ICD-10-CM

## 2015-08-24 DIAGNOSIS — K219 Gastro-esophageal reflux disease without esophagitis: Secondary | ICD-10-CM | POA: Diagnosis present

## 2015-08-24 DIAGNOSIS — E7800 Pure hypercholesterolemia, unspecified: Secondary | ICD-10-CM | POA: Diagnosis present

## 2015-08-24 DIAGNOSIS — C8440 Peripheral T-cell lymphoma, not classified, unspecified site: Secondary | ICD-10-CM | POA: Diagnosis present

## 2015-08-24 DIAGNOSIS — R627 Adult failure to thrive: Secondary | ICD-10-CM | POA: Diagnosis present

## 2015-08-24 DIAGNOSIS — Z9221 Personal history of antineoplastic chemotherapy: Secondary | ICD-10-CM

## 2015-08-24 DIAGNOSIS — R5081 Fever presenting with conditions classified elsewhere: Secondary | ICD-10-CM | POA: Diagnosis present

## 2015-08-24 DIAGNOSIS — E785 Hyperlipidemia, unspecified: Secondary | ICD-10-CM | POA: Diagnosis present

## 2015-08-24 DIAGNOSIS — I248 Other forms of acute ischemic heart disease: Secondary | ICD-10-CM | POA: Diagnosis present

## 2015-08-24 DIAGNOSIS — Z7982 Long term (current) use of aspirin: Secondary | ICD-10-CM

## 2015-08-24 DIAGNOSIS — R569 Unspecified convulsions: Secondary | ICD-10-CM | POA: Diagnosis present

## 2015-08-24 DIAGNOSIS — Z8673 Personal history of transient ischemic attack (TIA), and cerebral infarction without residual deficits: Secondary | ICD-10-CM

## 2015-08-24 DIAGNOSIS — I10 Essential (primary) hypertension: Secondary | ICD-10-CM | POA: Diagnosis present

## 2015-08-24 LAB — CBC WITH DIFFERENTIAL/PLATELET
BASOS ABS: 0 10*3/uL (ref 0–0.1)
BLASTS: 0 %
Band Neutrophils: 10 %
Basophils Relative: 0 %
Eosinophils Absolute: 0 10*3/uL (ref 0–0.7)
Eosinophils Relative: 0 %
HEMATOCRIT: 23.7 % — AB (ref 40.0–52.0)
Hemoglobin: 7.7 g/dL — ABNORMAL LOW (ref 13.0–18.0)
Lymphocytes Relative: 38 %
Lymphs Abs: 1.1 10*3/uL (ref 1.0–3.6)
MCH: 29.8 pg (ref 26.0–34.0)
MCHC: 32.4 g/dL (ref 32.0–36.0)
MCV: 92.1 fL (ref 80.0–100.0)
METAMYELOCYTES PCT: 3 %
MYELOCYTES: 5 %
Monocytes Absolute: 0.6 10*3/uL (ref 0.2–1.0)
Monocytes Relative: 21 %
NEUTROS PCT: 20 %
NRBC: 3 /100{WBCs} — AB
Neutro Abs: 1.1 10*3/uL — ABNORMAL LOW (ref 1.4–6.5)
Other: 2 %
PLATELETS: 156 10*3/uL (ref 150–440)
PROMYELOCYTES ABS: 1 %
RBC: 2.57 MIL/uL — AB (ref 4.40–5.90)
RDW: 19.2 % — ABNORMAL HIGH (ref 11.5–14.5)
WBC: 2.9 10*3/uL — AB (ref 3.8–10.6)

## 2015-08-24 LAB — COMPREHENSIVE METABOLIC PANEL
ALBUMIN: 3.2 g/dL — AB (ref 3.5–5.0)
ALT: 22 U/L (ref 17–63)
AST: 16 U/L (ref 15–41)
Alkaline Phosphatase: 94 U/L (ref 38–126)
Anion gap: 6 (ref 5–15)
BILIRUBIN TOTAL: 0.4 mg/dL (ref 0.3–1.2)
BUN: 21 mg/dL — AB (ref 6–20)
CHLORIDE: 102 mmol/L (ref 101–111)
CO2: 29 mmol/L (ref 22–32)
CREATININE: 0.98 mg/dL (ref 0.61–1.24)
Calcium: 8.4 mg/dL — ABNORMAL LOW (ref 8.9–10.3)
GFR calc Af Amer: >60 mL/min (ref >60)
GLUCOSE: 128 mg/dL — AB (ref 65–99)
POTASSIUM: 4 mmol/L (ref 3.5–5.1)
Sodium: 137 mmol/L (ref 135–145)
Total Protein: 5.4 g/dL — ABNORMAL LOW (ref 6.5–8.1)

## 2015-08-24 LAB — TROPONIN I: Troponin I: 0.14 ng/mL — ABNORMAL HIGH (ref <0.031)

## 2015-08-24 LAB — LACTIC ACID, PLASMA: Lactic Acid, Venous: 1.1 mmol/L (ref 0.5–2.0)

## 2015-08-24 LAB — LIPASE, BLOOD: LIPASE: 34 U/L (ref 11–51)

## 2015-08-24 MED ORDER — SODIUM CHLORIDE 0.9 % IV BOLUS (SEPSIS)
500.0000 mL | INTRAVENOUS | Status: AC
  Administered 2015-08-24: 500 mL via INTRAVENOUS

## 2015-08-24 MED ORDER — PIPERACILLIN-TAZOBACTAM 3.375 G IVPB
3.3750 g | Freq: Once | INTRAVENOUS | Status: DC
  Filled 2015-08-24: qty 50

## 2015-08-24 MED ORDER — SODIUM CHLORIDE 0.9 % IV BOLUS (SEPSIS)
1000.0000 mL | Freq: Once | INTRAVENOUS | Status: AC
  Administered 2015-08-24: 1000 mL via INTRAVENOUS

## 2015-08-24 MED ORDER — VANCOMYCIN HCL IN DEXTROSE 1-5 GM/200ML-% IV SOLN
1000.0000 mg | Freq: Once | INTRAVENOUS | Status: AC
  Administered 2015-08-24: 1000 mg via INTRAVENOUS
  Filled 2015-08-24: qty 200

## 2015-08-24 MED ORDER — PIPERACILLIN-TAZOBACTAM 3.375 G IVPB 30 MIN
3.3750 g | Freq: Once | INTRAVENOUS | Status: AC
  Administered 2015-08-24: 3.375 g via INTRAVENOUS

## 2015-08-24 MED ORDER — MORPHINE SULFATE (PF) 4 MG/ML IV SOLN
4.0000 mg | Freq: Once | INTRAVENOUS | Status: AC
  Administered 2015-08-24: 4 mg via INTRAVENOUS
  Filled 2015-08-24: qty 1

## 2015-08-24 MED ORDER — ONDANSETRON HCL 4 MG/2ML IJ SOLN
4.0000 mg | Freq: Once | INTRAMUSCULAR | Status: AC
  Administered 2015-08-24: 4 mg via INTRAVENOUS
  Filled 2015-08-24: qty 2

## 2015-08-24 NOTE — ED Notes (Signed)
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 Presents with sudden onset of lower back and lower abd pain that started at 15:00 today. Family also reports temperature of 99.9.

## 2015-08-24 NOTE — ED Notes (Addendum)
Pt presents to ED with fever and back pain. Wife and daughter at bedside. Per wife pt had same symptoms 3 weeks ago, 1 week after chemotherapy. Per wife, pt stated he was warm, wife took temperature about half hour before arrival.  Pt also has back pain. Pt is lying in bed, groaning. Pt received last chemo treatment on the 30th.

## 2015-08-25 DIAGNOSIS — Z7982 Long term (current) use of aspirin: Secondary | ICD-10-CM

## 2015-08-25 DIAGNOSIS — E7800 Pure hypercholesterolemia, unspecified: Secondary | ICD-10-CM | POA: Diagnosis present

## 2015-08-25 DIAGNOSIS — Z9221 Personal history of antineoplastic chemotherapy: Secondary | ICD-10-CM

## 2015-08-25 DIAGNOSIS — Z8673 Personal history of transient ischemic attack (TIA), and cerebral infarction without residual deficits: Secondary | ICD-10-CM | POA: Diagnosis not present

## 2015-08-25 DIAGNOSIS — E785 Hyperlipidemia, unspecified: Secondary | ICD-10-CM | POA: Diagnosis present

## 2015-08-25 DIAGNOSIS — R5081 Fever presenting with conditions classified elsewhere: Secondary | ICD-10-CM | POA: Diagnosis present

## 2015-08-25 DIAGNOSIS — D709 Neutropenia, unspecified: Secondary | ICD-10-CM | POA: Diagnosis present

## 2015-08-25 DIAGNOSIS — Z87891 Personal history of nicotine dependence: Secondary | ICD-10-CM | POA: Diagnosis not present

## 2015-08-25 DIAGNOSIS — T451X5S Adverse effect of antineoplastic and immunosuppressive drugs, sequela: Secondary | ICD-10-CM | POA: Diagnosis not present

## 2015-08-25 DIAGNOSIS — R627 Adult failure to thrive: Secondary | ICD-10-CM

## 2015-08-25 DIAGNOSIS — I10 Essential (primary) hypertension: Secondary | ICD-10-CM | POA: Diagnosis present

## 2015-08-25 DIAGNOSIS — I248 Other forms of acute ischemic heart disease: Secondary | ICD-10-CM | POA: Diagnosis present

## 2015-08-25 DIAGNOSIS — R5383 Other fatigue: Secondary | ICD-10-CM

## 2015-08-25 DIAGNOSIS — R531 Weakness: Secondary | ICD-10-CM

## 2015-08-25 DIAGNOSIS — D701 Agranulocytosis secondary to cancer chemotherapy: Secondary | ICD-10-CM | POA: Diagnosis not present

## 2015-08-25 DIAGNOSIS — D638 Anemia in other chronic diseases classified elsewhere: Secondary | ICD-10-CM | POA: Diagnosis present

## 2015-08-25 DIAGNOSIS — R05 Cough: Secondary | ICD-10-CM

## 2015-08-25 DIAGNOSIS — R109 Unspecified abdominal pain: Secondary | ICD-10-CM

## 2015-08-25 DIAGNOSIS — K219 Gastro-esophageal reflux disease without esophagitis: Secondary | ICD-10-CM | POA: Diagnosis present

## 2015-08-25 DIAGNOSIS — Z79899 Other long term (current) drug therapy: Secondary | ICD-10-CM

## 2015-08-25 DIAGNOSIS — C8448 Peripheral T-cell lymphoma, not classified, lymph nodes of multiple sites: Secondary | ICD-10-CM | POA: Diagnosis not present

## 2015-08-25 DIAGNOSIS — C8440 Peripheral T-cell lymphoma, not classified, unspecified site: Secondary | ICD-10-CM | POA: Diagnosis present

## 2015-08-25 DIAGNOSIS — R569 Unspecified convulsions: Secondary | ICD-10-CM | POA: Diagnosis present

## 2015-08-25 LAB — LACTIC ACID, PLASMA: LACTIC ACID, VENOUS: 1.1 mmol/L (ref 0.5–2.0)

## 2015-08-25 LAB — PATHOLOGIST SMEAR REVIEW

## 2015-08-25 LAB — URINALYSIS COMPLETE WITH MICROSCOPIC (ARMC ONLY)
BACTERIA UA: NONE SEEN
Bilirubin Urine: NEGATIVE
Glucose, UA: NEGATIVE mg/dL
HGB URINE DIPSTICK: NEGATIVE
Ketones, ur: NEGATIVE mg/dL
LEUKOCYTES UA: NEGATIVE
NITRITE: NEGATIVE
PH: 6 (ref 5.0–8.0)
Protein, ur: NEGATIVE mg/dL
Specific Gravity, Urine: 1.019 (ref 1.005–1.030)
Squamous Epithelial / LPF: NONE SEEN

## 2015-08-25 LAB — TROPONIN I
TROPONIN I: 0.13 ng/mL — AB (ref <0.031)
Troponin I: 0.15 ng/mL — ABNORMAL HIGH (ref <0.031)

## 2015-08-25 MED ORDER — OXYCODONE-ACETAMINOPHEN 5-325 MG PO TABS: 1.0000 | ORAL_TABLET | ORAL | Status: DC | PRN

## 2015-08-25 MED ORDER — PIPERACILLIN-TAZOBACTAM 4.5 G IVPB
4.5000 g | Freq: Three times a day (TID) | INTRAVENOUS | Status: DC
  Administered 2015-08-25 – 2015-08-26 (×3): 4.5 g via INTRAVENOUS
  Filled 2015-08-25 (×5): qty 100

## 2015-08-25 MED ORDER — ONDANSETRON HCL 4 MG PO TABS: 4.0000 mg | ORAL_TABLET | Freq: Four times a day (QID) | ORAL | Status: DC | PRN

## 2015-08-25 MED ORDER — ASPIRIN EC 325 MG PO TBEC
325.0000 mg | DELAYED_RELEASE_TABLET | Freq: Every day | ORAL | Status: DC
  Administered 2015-08-25 – 2015-08-26 (×2): 325 mg via ORAL
  Filled 2015-08-25 (×2): qty 1

## 2015-08-25 MED ORDER — DEXTROSE 5 % IV SOLN
2.0000 g | Freq: Three times a day (TID) | INTRAVENOUS | Status: DC
  Administered 2015-08-25 (×2): 2 g via INTRAVENOUS
  Filled 2015-08-25 (×3): qty 2

## 2015-08-25 MED ORDER — ENOXAPARIN SODIUM 40 MG/0.4ML ~~LOC~~ SOLN: 40.0000 mg | SUBCUTANEOUS | Status: DC

## 2015-08-25 MED ORDER — ONDANSETRON HCL 4 MG/2ML IJ SOLN: 4.0000 mg | Freq: Four times a day (QID) | INTRAMUSCULAR | Status: DC | PRN

## 2015-08-25 MED ORDER — PRAVASTATIN SODIUM 40 MG PO TABS
80.0000 mg | ORAL_TABLET | Freq: Every day | ORAL | Status: DC
  Administered 2015-08-25 – 2015-08-26 (×2): 80 mg via ORAL
  Filled 2015-08-25 (×2): qty 2

## 2015-08-25 MED ORDER — PANTOPRAZOLE SODIUM 40 MG PO TBEC
40.0000 mg | DELAYED_RELEASE_TABLET | Freq: Every day | ORAL | Status: DC
Start: 2015-08-25 — End: 2015-08-26
  Administered 2015-08-25 – 2015-08-26 (×2): 40 mg via ORAL
  Filled 2015-08-25 (×2): qty 1

## 2015-08-25 MED ORDER — IOHEXOL 240 MG/ML SOLN: 25.0000 mL | Freq: Once | INTRAMUSCULAR | Status: DC | PRN

## 2015-08-25 MED ORDER — SODIUM CHLORIDE 0.9 % IV SOLN
Freq: Once | INTRAVENOUS | Status: AC
  Administered 2015-08-25: 02:00:00 via INTRAVENOUS

## 2015-08-25 MED ORDER — MORPHINE SULFATE (PF) 2 MG/ML IV SOLN: 1.0000 mg | INTRAVENOUS | Status: DC | PRN

## 2015-08-25 MED ORDER — ACETAMINOPHEN 650 MG RE SUPP: 650.0000 mg | Freq: Four times a day (QID) | RECTAL | Status: DC | PRN

## 2015-08-25 MED ORDER — LOSARTAN POTASSIUM 50 MG PO TABS
50.0000 mg | ORAL_TABLET | Freq: Every day | ORAL | Status: DC
  Administered 2015-08-25 – 2015-08-26 (×2): 50 mg via ORAL
  Filled 2015-08-25 (×2): qty 1

## 2015-08-25 MED ORDER — ACETAMINOPHEN 325 MG PO TABS: 650.0000 mg | ORAL_TABLET | Freq: Four times a day (QID) | ORAL | Status: DC | PRN

## 2015-08-25 MED ORDER — ENOXAPARIN SODIUM 40 MG/0.4ML ~~LOC~~ SOLN
40.0000 mg | SUBCUTANEOUS | Status: DC
Start: 2015-08-25 — End: 2015-08-26
  Administered 2015-08-25 – 2015-08-26 (×2): 40 mg via SUBCUTANEOUS
  Filled 2015-08-25 (×2): qty 0.4

## 2015-08-25 NOTE — Progress Notes (Signed)
Erik Shelton at [place] NAME: Erik Shelton    MR#:  [id number]  DATE OF BIRTH:  [birth year]  SUBJECTIVE:  CHIEF COMPLAINT:   Chief Complaint  Patient presents with  . Back Pain  . Abdominal Pain  . Fever   Patient here due to low-grade fevers and also noted to be weak. No fever today and clinically feels better. No nausea, vomiting, abdominal pain.  REVIEW OF SYSTEMS:    Review of Systems  Constitutional: Positive for malaise/fatigue. Negative for fever and chills.  HENT: Negative for congestion and tinnitus.   Eyes: Negative for blurred vision and double vision.  Respiratory: Negative for cough, shortness of breath and wheezing.   Cardiovascular: Negative for chest pain, orthopnea and PND.  Gastrointestinal: Negative for nausea, vomiting, abdominal pain and diarrhea.  Genitourinary: Negative for dysuria and hematuria.  Neurological: Negative for dizziness, sensory change and focal weakness.  All other systems reviewed and are negative.   Nutrition: Regular Tolerating Diet: Yes Tolerating PT: Ambulatory    DRUG ALLERGIES:  No Known Allergies  VITALS:  Blood pressure 127/70, pulse 101, temperature 97.8 F (36.6 C), temperature source Oral, resp. rate 20, height 5\' 11"  (1.803 m), weight 91.944 kg (202 lb 11.2 oz), SpO2 100 %.  PHYSICAL EXAMINATION:   Physical Exam  GENERAL:  74 y.o.-year-old patient lying in the bed with no acute distress.  EYES: Pupils equal, round, reactive to light and accommodation. No scleral icterus. Extraocular muscles intact.  HEENT: Head atraumatic, normocephalic. Oropharynx and nasopharynx clear.  NECK:  Supple, no jugular venous distention. No thyroid enlargement, no tenderness.  LUNGS: Normal breath sounds bilaterally, no wheezing, rales, rhonchi. No use of accessory muscles of respiration.  CARDIOVASCULAR: S1, S2 normal. No murmurs, rubs, or gallops.  ABDOMEN: Soft, nontender,  nondistended. Bowel sounds present. No organomegaly or mass.  EXTREMITIES: No cyanosis, clubbing or edema b/l.    NEUROLOGIC: Cranial nerves II through XII are intact. No focal Motor or sensory deficits b/l.   PSYCHIATRIC: The patient is alert and oriented x 3.  SKIN: No obvious rash, lesion, or ulcer.    LABORATORY PANEL:   CBC  Recent Labs Lab 08/24/15 2136  WBC 2.9*  HGB 7.7*  HCT 23.7*  PLT 156   ------------------------------------------------------------------------------------------------------------------  Chemistries   Recent Labs Lab 08/24/15 2136  NA 137  K 4.0  CL 102  CO2 29  GLUCOSE 128*  BUN 21*  CREATININE 0.98  CALCIUM 8.4*  AST 16  ALT 22  ALKPHOS 94  BILITOT 0.4   ------------------------------------------------------------------------------------------------------------------  Cardiac Enzymes  Recent Labs Lab 08/25/15 0842  TROPONINI 0.13*   ------------------------------------------------------------------------------------------------------------------  RADIOLOGY:  Dg Chest Portable 1 View  08/24/2015  CLINICAL DATA:  Chest pain, history of lymphoma with recent chemotherapy EXAM: PORTABLE CHEST - 1 VIEW COMPARISON:  08/04/2015 FINDINGS: Cardiac shadow is stable but mildly enlarged. A right chest wall port is again seen and stable. Minimal left basilar atelectasis is noted. No focal confluent infiltrate is seen. IMPRESSION: Minimal left basilar atelectasis. Electronically Signed   By: Inez Catalina M.D.   On: 08/24/2015 21:23     ASSESSMENT AND PLAN:   74 year old male with past medical history of T-cell lymphoma currently getting chemotherapy, hypertension, chronic anemia/leukopenia, history of seizures, history of previous CVA, who presented to the hospital due to fever and weakness.  #1 neutropenic fevers-patient presented to the hospital with low-grade fever and noted to be leukopenic. -There is really no source  of infection  presently. Urinalysis is negative chest x-ray is negative. -Patient was given 1 dose of IV vancomycin and Zosyn and started on Fortaz.  Discussed with oncology and they're recommend switching the Fortaz to IV Zosyn. We'll follow blood and urine cultures and follow fever curve.  #2 T-cell lymphoma-currently getting chemotherapy. -Oncology following and continue care as per them. Currently leukopenic/anemic but no need for acute intervention.  #3 hypertension-continue losartan.  #4 hyperlipidemia-continue Pravachol.  #5 GERD-continue Protonix  #6 Elevated troponin-likely in the setting of demand ischemia. No evidence of acute chest pain or coronary syndrome. Continue aspirin, statin.     All the records are reviewed and case discussed with Care Management/Social Workerr. Management plans discussed with the patient, family and they are in agreement.  CODE STATUS: Full  DVT Prophylaxis: Lovenox  TOTAL TIME TAKING CARE OF THIS PATIENT: 30 minutes.   POSSIBLE D/C IN 1-2 DAYS, DEPENDING ON CLINICAL CONDITION.   Henreitta Leber M.D on 08/25/2015 at 2:46 PM  Between 7am to 6pm - Pager - [phone]  After 6pm go to [email or web address] - password [email or web address] [facility]  Office  [phone]  CC: Primary care physician; [name]., MD

## 2015-08-25 NOTE — Consult Note (Signed)
[place] NOTE  Patient Care Team: [name], MD as PCP - General (Internal Medicine) Requesting provider: Dr.Sainani   CHIEF COMPLAINTS/PURPOSE OF CONSULTATION:  Neutropenic fever   HISTORY OF PRESENTING ILLNESS:  Erik Shelton 74 y.o.  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 Grayland Ormond in the [place] is currently on chemotherapy with COEP (doxorubicin was admitted due to reaching a lifetime maximum dose.. Patient is currently status post 6 cycles D10; patient received Neulasta.  Patient claims that he started to feel poorly yesterday afternoon, with fatigue, likely weakness, abdominal pain. He claims that his symptoms appear to be similar to the ones he experienced after the previous round of chemotherapy, when he developed septic picture with high fevers.  He has chronic cough, but does not produce any sputum. He denies nausea, vomiting, diarrhea, constipation, chest pain, his abdominal pain has resolved.  ROS: A complete 10 point review of system is done which is negative except mentioned above in history of present illness  MEDICAL HISTORY:  Past Medical History  Diagnosis Date  . Stroke ([place])   . Seizures ([place])   . Hypercholesteremia   . HTN (hypertension)   . Cancer ([place])     T-Cell Lymphoma  . Seizures ([place])     SURGICAL HISTORY: Past Surgical History  Procedure Laterality Date  . Peripheral vascular catheterization N/A 04/24/2015    Procedure: Glori Luis Cath Insertion;  Surgeon: Algernon Huxley, MD;  Location: [facility];  Service: Cardiovascular;  Laterality: N/A;  . Portacath placement      SOCIAL HISTORY: Social History   Social History  . Marital Status: Married    Spouse Name: N/A  . Number of Children: N/A  . Years of Education: N/A   Occupational History  . Not on file.   Social History Main Topics  . Smoking status:  Former Research scientist (life sciences)  . Smokeless tobacco: Not on file     Comment: Quit  in 1990  . Alcohol Use: Yes     Comment: Occassional glass of wine  . Drug Use: No  . Sexual Activity: Not on file   Other Topics Concern  . Not on file   Social History Narrative    FAMILY HISTORY: No history of cancers in the family.  ALLERGIES:  has No Known Allergies.  MEDICATIONS:  Current Facility-Administered Medications  Medication Dose Route Frequency Provider Last Rate Last Dose  . acetaminophen (TYLENOL) tablet 650 mg  650 mg Oral Q6H PRN Fritzi Mandes, MD       Or  . acetaminophen (TYLENOL) suppository 650 mg  650 mg Rectal Q6H PRN Fritzi Mandes, MD      . aspirin EC tablet 325 mg  325 mg Oral Daily Fritzi Mandes, MD   325 mg at 08/25/15 0826  . cefTAZidime (FORTAZ) 2 g in dextrose 5 % 50 mL IVPB  2 g Intravenous 3 times per day Fritzi Mandes, MD   2 g at 08/25/15 [id number]  . enoxaparin (LOVENOX) injection 40 mg  40 mg Subcutaneous Q24H Fritzi Mandes, MD   40 mg at 08/25/15 0636  . losartan (COZAAR) tablet 50 mg  50 mg Oral Daily Fritzi Mandes, MD   50 mg at 08/25/15 0826  . morphine 2 MG/ML injection 1 mg  1 mg Intravenous Q4H PRN Fritzi Mandes, MD      . ondansetron (ZOFRAN) tablet 4 mg  4 mg  Oral Q6H PRN Fritzi Mandes, MD       Or  . ondansetron (ZOFRAN) injection 4 mg  4 mg Intravenous Q6H PRN Fritzi Mandes, MD      . oxyCODONE-acetaminophen (PERCOCET/ROXICET) 5-325 MG per tablet 1 tablet  1 tablet Oral Q4H PRN Fritzi Mandes, MD      . pantoprazole (PROTONIX) EC tablet 40 mg  40 mg Oral Daily Fritzi Mandes, MD   40 mg at 08/25/15 0827  . pravastatin (PRAVACHOL) tablet 80 mg  80 mg Oral Daily Fritzi Mandes, MD   80 mg at 08/25/15 0827   Facility-Administered Medications Ordered in Other Encounters  Medication Dose Route Frequency Provider Last Rate Last Dose  . sodium chloride 0.9 % injection 10 mL  10 mL Intracatheter PRN Lloyd Huger, MD   10 mL at 07/05/15 0900      .  PHYSICAL EXAMINATION:  Filed Vitals:   08/25/15  0204 08/25/15 0517  BP: 142/76 105/65  Pulse: 91 88  Temp: 97.9 F (36.6 C) 98.4 F (36.9 C)  Resp: 18 19   Filed Weights   08/24/15 2028 08/25/15 0204  Weight: 192 lb (87.091 kg) 202 lb 11.2 oz (91.944 kg)    GENERAL: Well-nourished well-developed Hispanic male; Alert, no distress and comfortable.   Accompanied by his wife. He is lying in bed. EYES: no pallor or icterus OROPHARYNX: no thrush or ulceration; good dentition  NECK: supple, no masses felt LYMPH:  no palpable lymphadenopathy in the cervical; positive for axillary adenopathy in the right underarm. LUNGS: clear to auscultation and  No wheeze or crackles HEART/CVS: regular rate & rhythm and no murmurs; No lower extremity edema ABDOMEN: abdomen soft, non-tender and normal bowel sounds Musculoskeletal:no cyanosis of digits and no clubbing  PSYCH: alert & oriented x 3 with fluent speech NEURO: no focal motor/sensory deficits SKIN:  no rashes or significant lesions, wet with perspiration  LABORATORY DATA:  I have reviewed the data as listed Lab Results  Component Value Date   WBC 2.9* 08/24/2015   HGB 7.7* 08/24/2015   HCT 23.7* 08/24/2015   MCV 92.1 08/24/2015   PLT 156 08/24/2015    Recent Labs  08/04/15 1109 08/05/15 0343 08/07/15 0535 08/14/15 1408 08/24/15 2136  NA 132* 136  --  137 137  K 3.8 3.6  --  4.4 4.0  CL 100* 104  --  101 102  CO2 27 27  --  26 29  GLUCOSE 119* 112*  --  110* 128*  BUN 15 13  --  21* 21*  CREATININE 0.74 0.80 0.80 1.28* 0.98  CALCIUM 8.8* 8.6*  --  9.1 8.4*  GFRNONAA >60 >60 >60 53* >60  GFRAA >60 >60 >60 >60 >60  PROT 6.1*  --   --  7.0 5.4*  ALBUMIN 3.5  --   --  3.9 3.2*  AST 185*  --   --  22 16  ALT 280*  --   --  28 22  ALKPHOS 490*  --   --  208* 94  BILITOT 0.9  --   --  0.5 0.4    RADIOGRAPHIC STUDIES: I have personally reviewed the radiological images as listed and agreed with the findings in the report. Dg Chest 2 View  08/04/2015  CLINICAL DATA:   Patient brought in by [facility], EMS was called out for fall and possible seizure like activity with incontience. Patient has temp of 102.6 per EMS. Heart rate 140, BP 156/82, CBG 164.  Patient has a hx/o lymphoma, last chemo treatment was Thursday. EXAM: CHEST  2 VIEW COMPARISON:  12/10/2012 FINDINGS: Lung volumes are low leading to crowding of the bronchovascular structures. There is no lung consolidation to suggest lobar pneumonia and no convincing pulmonary edema. No pleural effusion or pneumothorax. Cardiac silhouette is top-normal in size. No mediastinal or hilar masses or convincing adenopathy. Right anterior chest wall Port-A-Cath has its tip at the caval atrial junction, new since the prior study. Bony thorax is demineralized but intact. IMPRESSION: No acute cardiopulmonary disease. Electronically Signed   By: Lajean Manes M.D.   On: 08/04/2015 12:46   Ct Head Wo Contrast  08/04/2015  CLINICAL DATA:  Fall, possible seizure. EXAM: CT HEAD WITHOUT CONTRAST TECHNIQUE: Contiguous axial images were obtained from the base of the skull through the vertex without intravenous contrast. COMPARISON:  None. FINDINGS: Bony calvarium appears intact. Minimal diffuse cortical atrophy is noted. Mild chronic ischemic white matter disease is noted. No mass effect or midline shift is noted. Ventricular size is within normal limits. There is no evidence of mass lesion, hemorrhage or acute infarction. IMPRESSION: Minimal diffuse cortical atrophy. Mild chronic ischemic white matter disease. No acute intracranial abnormality seen. Electronically Signed   By: Marijo Conception, M.D.   On: 08/04/2015 12:49   Dg Chest Portable 1 View  08/24/2015  CLINICAL DATA:  Chest pain, history of lymphoma with recent chemotherapy EXAM: PORTABLE CHEST - 1 VIEW COMPARISON:  08/04/2015 FINDINGS: Cardiac shadow is stable but mildly enlarged. A right chest wall port is again seen and stable. Minimal left basilar atelectasis is noted. No focal  confluent infiltrate is seen. IMPRESSION: Minimal left basilar atelectasis. Electronically Signed   By: Inez Catalina M.D.   On: 08/24/2015 21:23    ASSESSMENT & PLAN:   74 year old male patient with above history of peripheral T-cell lymphoma NOS stage IV- currently on chemotherapy with C(H)OEP status post cycle #6 day #10 currently admitted to the hospital for failure to thrive, fatigue, generalized abdominal pain, was found to have temperature of 77F and leukopenia.  # Failure to thrive, fatigue-patient claims that after the previous rounds of chemotherapy he developed septic like picture, with high fevers, and that this time around his symptoms that resemble the early signs of febrile illness. It seems appropriate to admit the patient, especially in view of potential for further decline of white blood cell count over the next week or so. I would recommend to discontinue ceftazidime and switched to to Zosyn, which would be a more appropriate coverage for infection in the patient who may become neutropenic over the next few days, and monitor him over the next 48 hours. Blood cultures were obtained, and pending, chest x-ray is done, reviewed by me, without signs of infiltrates; urine cultures. There is no need for antifungal and antiviral prophylaxis at this time. Hematology and oncology will continue to follow over the weekend.  # Peripheral T-cell lymphoma- on chemotherapy cycle # 6 day #10; status post Neulasta; no need for Neupogen at this time   Recommendations:   # Agree with medical management with IV antibiotics. Monitor closely.  # If patient's clinical status does not improve/worsens- consider ID evaluation.  Thank you Dr.Sainani for allowing me to participate in the care of your pleasant patient. Please do not hesitate to contact me if any questions or concerns. The above plan was discussed with Dr.Sainani.   Reviewed the images independently.   All questions were answered. The  patient knows  to call the clinic with any problems, questions or concerns.      Roxana Hires, MD 08/25/2015 11:03 AM

## 2015-08-25 NOTE — Plan of Care (Signed)
Problem: Activity: Goal: Risk for activity intolerance will decrease Outcome: Completed/Met Date Met:  08/25/15 Pt is alert and oriented x 4, wife at bedside, wbc low, oncology consulted with patient, on IV antibiotics, good appetite, denies pain, vital signs stable, afebrile throughout shift, patient mobile in bed, uneventful shift.     

## 2015-08-25 NOTE — Progress Notes (Signed)
-  No c/o pain since arriving to unit. Morphine or Percocet available PRN -VSS, afebrile. WBC 2.9, Hgb 7.7, Trop 0.14 -Oncology consulted.  -High fall risk: bed alarm on, hourly rounding w/ toileting offered

## 2015-08-25 NOTE — ED Notes (Signed)
Dr. Patel at bedside 

## 2015-08-25 NOTE — Progress Notes (Addendum)
ANTIBIOTIC CONSULT NOTE - INITIAL  Pharmacy Consult for Zosyn  Indication: Neutropenia, sepsis  No Known Allergies  Patient Measurements: Height: 5\' 11"  (180.3 cm) Weight: 202 lb 11.2 oz (91.944 kg) IBW/kg (Calculated) : 75.3 Adjusted Body Weight:   Vital Signs: Temp: 97.8 F (36.6 C) (12/09 1413) Temp Source: Oral (12/09 1413) BP: 127/70 mmHg (12/09 1413) Pulse Rate: 101 (12/09 1413) Intake/Output from previous day: 12/08 0701 - 12/09 0700 In: -  Out: 650 [Urine:650] Intake/Output from this shift: Total I/O In: 410 [P.O.:360; IV Piggyback:50] Out: 750 [Urine:750]  Labs:  Recent Labs  08/24/15 2136  WBC 2.9*  HGB 7.7*  PLT 156  CREATININE 0.98   Estimated Creatinine Clearance: 76.6 mL/min (by C-G formula based on Cr of 0.98). No results for input(s): VANCOTROUGH, VANCOPEAK, VANCORANDOM, GENTTROUGH, GENTPEAK, GENTRANDOM, TOBRATROUGH, TOBRAPEAK, TOBRARND, AMIKACINPEAK, AMIKACINTROU, AMIKACIN in the last 72 hours.   Microbiology: Recent Results (from the past 720 hour(s))  Blood Culture (routine x 2)     Status: None   Collection Time: 08/04/15 11:05 AM  Result Value Ref Range Status   Specimen Description BLOOD LEFT PORTA CATH  Final   Special Requests BOTTLES DRAWN AEROBIC AND ANAEROBIC  [place]  Final   Culture NO GROWTH 5 DAYS  Final   Report Status 08/09/2015 FINAL  Final  Urine culture     Status: None   Collection Time: 08/04/15 11:09 AM  Result Value Ref Range Status   Specimen Description URINE, RANDOM  Final   Special Requests NONE  Final   Culture NO GROWTH 2 DAYS  Final   Report Status 08/06/2015 FINAL  Final  Blood Culture (routine x 2)     Status: None   Collection Time: 08/04/15 11:10 AM  Result Value Ref Range Status   Specimen Description BLOOD RIGHT AC  Final   Special Requests   Final    BOTTLES DRAWN AEROBIC AND ANAEROBIC  AER 6CC ANA 2CC   Culture NO GROWTH 5 DAYS  Final   Report Status 08/09/2015 FINAL  Final  Culture, blood (routine x  2)     Status: None (Preliminary result)   Collection Time: 08/24/15 11:42 PM  Result Value Ref Range Status   Specimen Description BLOOD RIGHT PORTA CATH  Final   Special Requests BOTTLES DRAWN AEROBIC AND ANAEROBIC 9ML  Final   Culture NO GROWTH < 12 HOURS  Final   Report Status PENDING  Incomplete  Culture, blood (routine x 2)     Status: None (Preliminary result)   Collection Time: 08/24/15 11:42 PM  Result Value Ref Range Status   Specimen Description BLOOD LEFT HAND  Final   Special Requests   Final    BOTTLES DRAWN AEROBIC AND ANAEROBIC 5MLAEROBIC,15MLANAEROBIC   Culture NO GROWTH < 12 HOURS  Final   Report Status PENDING  Incomplete    Medical History: Past Medical History  Diagnosis Date  . Stroke ([place])   . Seizures ([place])   . Hypercholesteremia   . HTN (hypertension)   . Cancer ([place])     T-Cell Lymphoma  . Seizures ([place])     Medications:  Prescriptions prior to admission  Medication Sig Dispense Refill Last Dose  . acetaminophen (TYLENOL) 325 MG tablet Take 650 mg by mouth every 6 (six) hours as needed for mild pain, moderate pain, fever or headache.   08/24/2015 at 1600  . aspirin EC 325 MG tablet Take 325 mg by mouth daily.   08/24/2015 at 0900  . lidocaine-prilocaine (  EMLA) cream Apply 1 application topically as needed. 30 g 2 Past Week at Unknown time  . losartan (COZAAR) 50 MG tablet Take 50 mg by mouth daily.   08/24/2015 at 1600  . omeprazole (PRILOSEC) 40 MG capsule Take 1 capsule (40 mg total) by mouth daily. 30 capsule 2 08/24/2015 at Unknown time  . oxyCODONE-acetaminophen (ROXICET) 5-325 MG per tablet Take 1 tablet by mouth every 4 (four) hours as needed for severe pain. 60 tablet 0 Past Month at Unknown time  . pravastatin (PRAVACHOL) 80 MG tablet Take 80 mg by mouth daily.   08/24/2015 at 1600  . predniSONE (DELTASONE) 20 MG tablet Take 4.5 tablets (90mg ) daily on days 1 - 5 of chemotherapy 25 tablet 6 Past Week at Unknown time   Assessment: Pharmacy  consulted to dose zosyn in this 74 year old male admitted with sepsis,  neutropenia, currently undergoing chemotherapy for T-Cell lymphoma, last chemo was 08/18/15.  Goal of Therapy:  resolution/prevention of infection  Plan:  Expected duration 7 days with resolution of temperature and/or normalization of WBC   Will order Zosyn 4.5 gm IV Q8H EI .   Bayan Hedstrom D 08/25/2015,2:50 PM

## 2015-08-25 NOTE — ED Provider Notes (Signed)
[name]  ____________________________________________  Time seen: 11:15 PM  I have reviewed the triage vital signs and the nursing notes.   HISTORY  Chief Complaint Back Pain; Abdominal Pain; and Fever      HPI Erik Shelton is a 74 y.o. male presents with temperature at home of 99.9 as well as low abdominal and low back pain. Patient stated symptoms started approximately 3:00 PM today. Of note the patient has a history of stage IV lymphoma currently receiving chemotherapy last treatment of which was on Friday of last week. Patient also has a history of sepsis. Patient denies any dyspnea no cough no vomiting or diarrhea.    Past Medical History  Diagnosis Date  . Stroke ([place])   . Seizures ([place])   . Hypercholesteremia   . HTN (hypertension)   . Cancer ([place])     T-Cell Lymphoma  . Seizures [facility])     Patient Active Problem List   Diagnosis Date Noted  . Neutropenia ([place]) 08/25/2015  . 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 Sepsis ([place]) 08/04/2015  . Fever and chills 08/04/2015  . Intractable pain 04/14/2015  . 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 Peripheral vascular catheterization N/A 04/24/2015    Procedure: Glori Luis Cath Insertion;  Surgeon: Algernon Huxley, MD;  Location: [place] [facility];  Service: Cardiovascular;  Laterality: N/A;  . Portacath placement      No current outpatient prescriptions on file.  Allergies Review of patient's allergies indicates no known allergies.  History reviewed. No pertinent family history.  Social History Social History  Substance Use Topics  . Smoking status: Former Research scientist (life sciences)  . Smokeless tobacco: None     Comment: Quit  in 1990  . Alcohol Use: Yes     Comment: Occassional  glass of wine    Review of Systems  Constitutional: Positive for fever. Eyes: Negative for visual changes. ENT: Negative for sore throat. Cardiovascular: Negative for chest pain. Respiratory: Negative for shortness of breath. Gastrointestinal: Positive for abdominal pain Genitourinary: Negative for dysuria. Musculoskeletal: Positive for back pain. Skin: Negative for rash. Neurological: Negative for headaches, focal weakness or numbness.   10-point ROS otherwise negative.  ____________________________________________   PHYSICAL EXAM:  VITAL SIGNS: ED Triage Vitals  Enc Vitals Group     BP 08/24/15 2028 150/84 mmHg     Pulse Rate 08/24/15 2028 107     Resp 08/24/15 2028 24     Temp 08/24/15 2028 97.4 F (36.3 C)     Temp Source 08/24/15 2028 Oral     SpO2 08/24/15 2028 98 %     Weight 08/24/15 2028 192 lb (87.091 kg)     Height 08/24/15 2028 5\' 10"  (1.778 m)     Head Cir --      Peak Flow --      Pain Score 08/24/15 2027 10     Pain Loc --      Pain Edu? --      Excl. in [place]? --      Constitutional: Alert and oriented. Well appearing and in no distress. Eyes: Conjunctivae are normal. PERRL. Normal extraocular movements. ENT   Head: Normocephalic and atraumatic.   Nose: No congestion/rhinnorhea.   Mouth/Throat: Mucous membranes are moist.  Neck: No stridor. Hematological/Lymphatic/Immunilogical: No cervical lymphadenopathy. Cardiovascular: Normal rate, regular rhythm. Normal and symmetric distal pulses are present in all extremities. No murmurs, rubs, or gallops. Respiratory: Normal respiratory effort without tachypnea nor retractions. Breath sounds are clear and equal bilaterally. No wheezes/rales/rhonchi. Gastrointestinal: Soft and nontender. No distention. There is no CVA tenderness. Genitourinary: deferred Musculoskeletal: Nontender with normal range of motion in all extremities. No joint effusions.  No lower extremity tenderness nor  edema. Neurologic:  Normal speech and language. No gross focal neurologic deficits are appreciated. Speech is normal.  Skin:  Skin is warm, dry and intact. No rash noted. Psychiatric: Mood and affect are normal. Speech and behavior are normal. Patient exhibits appropriate insight and judgment.  ____________________________________________    LABS (pertinent positives/negatives)  Labs Reviewed  CBC WITH DIFFERENTIAL/PLATELET - Abnormal; Notable for the following:    WBC 2.9 (*)    RBC 2.57 (*)    Hemoglobin 7.7 (*)    HCT 23.7 (*)    RDW 19.2 (*)    nRBC 3 (*)    Neutro Abs 1.1 (*)    All other components within normal limits  COMPREHENSIVE METABOLIC PANEL - Abnormal; Notable for the following:    Glucose, Bld 128 (*)    BUN 21 (*)    Calcium 8.4 (*)    Total Protein 5.4 (*)    Albumin 3.2 (*)    All other components within normal limits  TROPONIN I - Abnormal; Notable for the following:    Troponin I 0.14 (*)    All other components within normal limits  URINALYSIS COMPLETEWITH MICROSCOPIC ([facility] ONLY) - Abnormal; Notable for the following:    Color, Urine YELLOW (*)    APPearance CLEAR (*)    All other components within normal limits  TROPONIN I - Abnormal; Notable for the following:    Troponin I 0.15 (*)    All other components within normal limits  CULTURE, BLOOD (ROUTINE X 2)  CULTURE, BLOOD (ROUTINE X 2)  LIPASE, BLOOD  LACTIC ACID, PLASMA  LACTIC ACID, PLASMA  PATHOLOGIST SMEAR REVIEW  TROPONIN I  TROPONIN I     ____________________________________________   EKG  ED ECG REPORT I, Noam Franzen, [place], the attending physician, personally viewed and interpreted this ECG.   Date: 08/25/2015  EKG Time: 10:02 PM  Rate: 101  Rhythm: Sinus tachycardia  Axis: None  Intervals: Normal  ST&T Change: None   ____________________________________________    RADIOLOGY    DG Chest Portable 1 View (Final result) Result time: 08/24/15 21:23:51   Final result  by Rad Results In Interface (08/24/15 21:23:51)   Narrative:   CLINICAL DATA: Chest pain, history of lymphoma with recent chemotherapy  EXAM: PORTABLE CHEST - 1 VIEW  COMPARISON: 08/04/2015  FINDINGS: Cardiac shadow is stable but mildly enlarged. A right chest wall port is again seen and stable. Minimal left basilar atelectasis is noted. No focal confluent infiltrate is seen.  IMPRESSION: Minimal left basilar atelectasis.   Electronically Signed By: Inez Catalina M.D. On: 08/24/2015 21:23           Critical Care performed: CRITICAL CARE Performed by: Marjean Donna N   Total critical care time: 30 minutes  Critical care time was exclusive of separately billable procedures and treating other patients.  Critical care was necessary to treat or prevent imminent or life-threatening deterioration.  Critical care was time spent personally by me on the following activities: development of treatment plan with patient and/or surrogate as well as  nursing, discussions with consultants, evaluation of patient's response to treatment, examination of patient, obtaining history from patient or surrogate, ordering and performing treatments and interventions, ordering and review of laboratory studies, ordering and review of radiographic studies, pulse oximetry and re-evaluation of patient's condition.   INITIAL IMPRESSION / ASSESSMENT AND PLAN / ED COURSE  Pertinent labs & imaging results that were available during my care of the patient were reviewed by me and considered in my medical decision making (see chart for details).  History of physical exam concerning for neutropenic fever. As such patient received broad-spectrum antibiotics. Patient was discussed with Dr. Posey Pronto for hospital admission for further evaluation and management  ____________________________________________   FINAL CLINICAL IMPRESSION(S) / ED DIAGNOSES  Final diagnoses:  Neutropenic fever ([place])       Gregor Hams, MD 08/25/15 [phone]

## 2015-08-25 NOTE — H&P (Signed)
[facility] [place] NAME: Erik Shelton    MR#:  [id number]  DATE OF BIRTH:  1940/10/29  DATE OF ADMISSION:  08/24/2015  PRIMARY CARE PHYSICIAN: [name]., MD   REQUESTING/REFERRING PHYSICIAN: Dr. Owens Shark  CHIEF COMPLAINT:  Fever of 99.0 Leg weakness  HISTORY OF PRESENT ILLNESS:  Erik Shelton  is a 74 y.o. male with a known history of peripheral T-cell lymphoma with ongoing chemotherapy last chemotherapy was given on 08/18/2015, hypertension, history of seizures comes to the emergency room with symptoms of weakness in both lower extremities and fever of 99. Patient took Tylenol around 4 PM this evening. Currently he is afebrile  Denies any shortness of breath cough chest pain dysuria headache and neck pain rash. He earlier had some abdominal pain which he attributes to possible gassy stomach. He is pain-free at present. Denies any vomiting diarrhea or constipation  Patient received IV Vanco and Zosyn in the ER  He was here with similar symptoms prior to Thanksgiving holidays and just completed a course of Levaquin for 7 days  Patient is being admitted for fever and neutropenia  His last dose of Neulasta was on 08/18/2015   PAST MEDICAL HISTORY:   Past Medical History  Diagnosis Date  . Stroke ([place])   . Seizures ([place])   . Hypercholesteremia   . HTN (hypertension)   . Cancer ([place])     T-Cell Lymphoma  . Seizures ([place])     PAST SURGICAL HISTOIRY:   Past Surgical History  Procedure Laterality Date  . Peripheral vascular catheterization N/A 04/24/2015    Procedure: Glori Luis Cath Insertion;  Surgeon: Algernon Huxley, MD;  Location: [facility];  Service: Cardiovascular;  Laterality: N/A;  . Portacath placement      SOCIAL HISTORY:   Social History  Substance Use Topics  . Smoking status: Former Research scientist (life sciences)  . Smokeless tobacco: Not on file     Comment: Quit  in 1990  . Alcohol Use: Yes     Comment: Occassional  glass of wine    FAMILY HISTORY:  History reviewed. No pertinent family history.  DRUG ALLERGIES:  No Known Allergies  REVIEW OF SYSTEMS:  Review of Systems  Constitutional: Positive for fever. Negative for chills and weight loss.  HENT: Negative for ear discharge, ear pain and nosebleeds.   Eyes: Negative for blurred vision, pain and discharge.  Respiratory: Negative for sputum production, shortness of breath, wheezing and stridor.   Cardiovascular: Negative for chest pain, palpitations, orthopnea and PND.  Gastrointestinal: Negative for nausea, vomiting, abdominal pain and diarrhea.  Genitourinary: Negative for urgency and frequency.  Musculoskeletal: Negative for back pain and joint pain.  Neurological: Positive for weakness. Negative for sensory change, speech change and focal weakness.  Psychiatric/Behavioral: Negative for depression. The patient is not nervous/anxious.      MEDICATIONS AT HOME:   Prior to Admission medications   Medication Sig Start Date End Date Taking? Authorizing Provider  acetaminophen (TYLENOL) 325 MG tablet Take 650 mg by mouth every 6 (six) hours as needed for mild pain, moderate pain, fever or headache.   Yes Historical Provider, MD  aspirin EC 325 MG tablet Take 325 mg by mouth daily.   Yes Historical Provider, MD  lidocaine-prilocaine (EMLA) cream Apply 1 application topically as needed. 05/03/15  Yes Lloyd Huger, MD  losartan (COZAAR) 50 MG tablet Take 50 mg by mouth daily.   Yes Historical Provider, MD  omeprazole ([place])  40 MG capsule Take 1 capsule (40 mg total) by mouth daily. 07/26/15  Yes Lloyd Huger, MD  oxyCODONE-acetaminophen (ROXICET) 5-325 MG per tablet Take 1 tablet by mouth every 4 (four) hours as needed for severe pain. 05/04/15  Yes Lloyd Huger, MD  pravastatin (PRAVACHOL) 80 MG tablet Take 80 mg by mouth daily.   Yes Historical Provider, MD  predniSONE (DELTASONE) 20 MG tablet Take 4.5 tablets (90mg ) daily on  days 1 - 5 of chemotherapy 05/04/15  Yes Lloyd Huger, MD      VITAL SIGNS:  Blood pressure 133/76, pulse 89, temperature 98.3 F (36.8 C), temperature source Oral, resp. rate 21, height 5\' 10"  (1.778 m), weight 192 lb (87.091 kg), SpO2 99 %.  PHYSICAL EXAMINATION:  GENERAL:  74 y.o.-year-old patient lying in the bed with no acute distress.  EYES: Pupils equal, round, reactive to light and accommodation. No scleral icterus. Extraocular muscles intact.  HEENT: Head atraumatic, normocephalic. Oropharynx and nasopharynx clear.  NECK:  Supple, no jugular venous distention. No thyroid enlargement, no tenderness.  LUNGS: Normal breath sounds bilaterally, no wheezing, rales,rhonchi or crepitation. No use of accessory muscles of respiration. Right upper chest port plus  CARDIOVASCULAR: S1, S2 normal. No murmurs, rubs, or gallops.  ABDOMEN: Soft, nontender, nondistended. Bowel sounds present. No organomegaly or mass.  EXTREMITIES: No pedal edema, cyanosis, or clubbing.  NEUROLOGIC: Cranial nerves II through XII are intact. Muscle strength 5/5 in all extremities. Sensation intact. Gait not checked.  PSYCHIATRIC: The patient is alert and oriented x 3.  SKIN: No obvious rash, lesion, or ulcer.   LABORATORY PANEL:   CBC  Recent Labs Lab 08/24/15 2136  WBC 2.9*  HGB 7.7*  HCT 23.7*  PLT 156   ------------------------------------------------------------------------------------------------------------------  Chemistries   Recent Labs Lab 08/24/15 2136  NA 137  K 4.0  CL 102  CO2 29  GLUCOSE 128*  BUN 21*  CREATININE 0.98  CALCIUM 8.4*  AST 16  ALT 22  ALKPHOS 94  BILITOT 0.4   ------------------------------------------------------------------------------------------------------------------  Cardiac Enzymes  Recent Labs Lab 08/24/15 2136  TROPONINI 0.14*    ------------------------------------------------------------------------------------------------------------------  RADIOLOGY:  Dg Chest Portable 1 View  08/24/2015  CLINICAL DATA:  Chest pain, history of lymphoma with recent chemotherapy EXAM: PORTABLE CHEST - 1 VIEW COMPARISON:  08/04/2015 FINDINGS: Cardiac shadow is stable but mildly enlarged. A right chest wall port is again seen and stable. Minimal left basilar atelectasis is noted. No focal confluent infiltrate is seen. IMPRESSION: Minimal left basilar atelectasis. Electronically Signed   By: Inez Catalina M.D.   On: 08/24/2015 21:23    EKG:  Sinus tachycardia  IMPRESSION AND PLAN:  Erik Shelton  is a 74 y.o. male with a known history of peripheral T-cell lymphoma with ongoing chemotherapy last chemotherapy was given on 08/18/2015, hypertension, history of seizures comes to the emergency room with symptoms of weakness in both lower extremities and fever of 99. Patient took Tylenol around 4 PM this evening. Currently he is afebrile   1. Low-grade fever with neutropenia in patient with undergoing chemotherapy for peripheral T-cell lymphoma -Admit to oncology floor -Empiric IV cefepime. Pharmacy to dose -Follow up blood culture urine culture -Chest x-ray negative for infection -Oncology consultation for neutropenia. Patient had his last dose of Neulasta on 08/18/2015 for the recommendations per oncology -Neutropenic precautions  2. Peripheral T-cell lymphoma Most recent chemotherapy completed on December 2  3. Hypertension continue Cozaar  4. Mild abdominal pain earlier seems to have resolved  etiology unclear We'll hold off on CT scan of the abdomen at present Patient continues to spike fever and recurrent abdominal pain consider CT abdomen that. Patient and family okay with that this was discussed with the ER physician.  5. Hyperlipidemia continue pravastatin  6. DVT prophylaxis Subcutaneous Lovenox.   All the records  are reviewed and case discussed with ED provider. Management plans discussed with the patient, family and they are in agreement.  CODE STATUS: Full  TOTAL TIME TAKING CARE OF THIS PATIENT: 50  minutes.    Carmellia Kreisler M.D on 08/25/2015 at 1:18 AM  Between 7am to 6pm - Pager - [phone]  After 6pm go to [email or web address] - password [email or web address] [facility]  Office  [phone]  CC: Primary care physician; [name]., MD

## 2015-08-26 LAB — CBC
HEMATOCRIT: 24.2 % — AB (ref 40.0–52.0)
Hemoglobin: 7.8 g/dL — ABNORMAL LOW (ref 13.0–18.0)
MCH: 29.8 pg (ref 26.0–34.0)
MCHC: 32.1 g/dL (ref 32.0–36.0)
MCV: 92.8 fL (ref 80.0–100.0)
PLATELETS: 136 10*3/uL — AB (ref 150–440)
RBC: 2.6 MIL/uL — ABNORMAL LOW (ref 4.40–5.90)
RDW: 18.5 % — AB (ref 11.5–14.5)
WBC: 20.3 10*3/uL — AB (ref 3.8–10.6)

## 2015-08-26 MED ORDER — FERROUS SULFATE 325 (65 FE) MG PO TABS: 325.0000 mg | ORAL_TABLET | Freq: Every day | ORAL | Status: DC

## 2015-08-26 MED ORDER — POLYETHYLENE GLYCOL 3350 17 G PO PACK: 17.0000 g | PACK | Freq: Every day | ORAL | Status: DC | PRN

## 2015-08-26 MED ORDER — AMOXICILLIN-POT CLAVULANATE 875-125 MG PO TABS: 1.0000 | ORAL_TABLET | Freq: Two times a day (BID) | ORAL | Status: DC

## 2015-08-26 MED ORDER — AMOXICILLIN-POT CLAVULANATE 875-125 MG PO TABS
1.0000 | ORAL_TABLET | Freq: Two times a day (BID) | ORAL | Status: DC
  Administered 2015-08-26: 1 via ORAL
  Filled 2015-08-26: qty 1

## 2015-08-26 MED ORDER — HEPARIN SOD (PORK) LOCK FLUSH 100 UNIT/ML IV SOLN
500.0000 [IU] | Freq: Once | INTRAVENOUS | Status: AC
  Administered 2015-08-26: 500 [IU] via INTRAVENOUS
  Filled 2015-08-26: qty 5

## 2015-08-26 NOTE — Plan of Care (Signed)
Problem: Safety: Goal: Ability to remain free from injury will improve Pt is alert and oriented x 4,  wbc low awaiting am labs, on IV antibiotics, denies pain, vital signs stable, afebrile throughout shift, uneventful shift.

## 2015-08-26 NOTE — Progress Notes (Signed)
Pt discharged today, paperwork and medications reviewed with patient, he and his wife voiced understanding. Pt belongings returned to patient. Patient rolled out in wheelchair by staff, met wife at entrance.

## 2015-08-26 NOTE — Discharge Summary (Signed)
[facility] [place] NAME: Erik Shelton    MR#:  [id number]  DATE OF BIRTH:  April 26, 1941  DATE OF ADMISSION:  08/24/2015 ADMITTING PHYSICIAN: Fritzi Mandes, MD  DATE OF DISCHARGE: 08/26/2015  PRIMARY CARE PHYSICIAN: [name]., MD    ADMISSION DIAGNOSIS:  back pain and fever  DISCHARGE DIAGNOSIS:  Active Problems:   Neutropenia ([place])   SECONDARY DIAGNOSIS:   Past Medical History  Diagnosis Date  . Stroke ([place])   . Seizures ([place])   . Hypercholesteremia   . HTN (hypertension)   . Cancer ([place])     T-Cell Lymphoma  . Seizures ([place])     HOSPITAL COURSE:   1. Neutropenic fever. Patient and family think it's secondary to the chemotherapy. This will have to be discussed with Dr. Grayland Ormond as outpatient. Patient's blood cultures and urine culture so far are negative. Urinalysis is completely normal. Patient was given Zosyn while here. I will switch over to Augmentin upon discharge. Patient is afebrile. White count went up to 20.2. 2. Anemia, likely of chronic disease. I will prescribe iron. Consider Procrit as outpatient 3. 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 Hyperlipidemia unspecified continue statin 5. Essential hypertension- blood pressure stable 6. History of stroke on aspirin  DISCHARGE CONDITIONS:   Satisfactory  CONSULTS OBTAINED:  Treatment Team:  Roxana Hires, MD Lloyd Huger, MD  DRUG ALLERGIES:  No Known Allergies  DISCHARGE MEDICATIONS:   Current Discharge Medication List    START taking these medications   Details  amoxicillin-clavulanate (AUGMENTIN) 875-125 MG tablet Take 1 tablet by mouth every 12 (twelve) hours. Qty: 10 tablet, Refills: 0    ferrous sulfate (FERROUSUL) 325 (65 FE) MG tablet Take 1 tablet (325 mg total) by mouth daily with breakfast. Qty: 30 tablet, Refills: 0    polyethylene glycol (MIRALAX / GLYCOLAX) packet Take 17 g by mouth daily as needed  for moderate constipation. Qty: 30 each, Refills: 0      CONTINUE these medications which have NOT CHANGED   Details  acetaminophen (TYLENOL) 325 MG tablet Take 650 mg by mouth every 6 (six) hours as needed for mild pain, moderate pain, fever or headache.    aspirin EC 325 MG tablet Take 325 mg by mouth daily.    lidocaine-prilocaine (EMLA) cream Apply 1 application topically as needed. Qty: 30 g, Refills: 2    losartan (COZAAR) 50 MG tablet Take 50 mg by mouth daily.    omeprazole (PRILOSEC) 40 MG capsule Take 1 capsule (40 mg total) by mouth daily. Qty: 30 capsule, Refills: 2    oxyCODONE-acetaminophen (ROXICET) 5-325 MG per tablet Take 1 tablet by mouth every 4 (four) hours as needed for severe pain. Qty: 60 tablet, Refills: 0    pravastatin (PRAVACHOL) 80 MG tablet Take 80 mg by mouth daily.    predniSONE (DELTASONE) 20 MG tablet Take 4.5 tablets (90mg ) daily on days 1 - 5 of chemotherapy Qty: 25 tablet, Refills: 6         DISCHARGE INSTRUCTIONS:   Follow-up with Dr. Grayland Ormond as outpatient  If you experience worsening of your admission symptoms, develop shortness of breath, life threatening emergency, suicidal or homicidal thoughts you must seek medical attention immediately by calling 911 or calling your MD immediately  if symptoms less severe.  You Must read complete instructions/literature along with all the possible adverse reactions/side effects for all the Medicines you take and that have been prescribed to you. Take any  new Medicines after you have completely understood and accept all the possible adverse reactions/side effects.   Please note  You were cared for by a hospitalist during your hospital stay. If you have any questions about your discharge medications or the care you received while you were in the hospital after you are discharged, you can call the unit and asked to speak with the hospitalist on call if the hospitalist that took care of you is not  available. Once you are discharged, your primary care physician will handle any further medical issues. Please note that NO REFILLS for any discharge medications will be authorized once you are discharged, as it is imperative that you return to your primary care physician (or establish a relationship with a primary care physician if you do not have one) for your aftercare needs so that they can reassess your need for medications and monitor your lab values.    Today   CHIEF COMPLAINT:   Chief Complaint  Patient presents with  . Back Pain  . Abdominal Pain  . Fever    HISTORY OF PRESENT ILLNESS:  Erik Shelton  is a 74 y.o. male with a known history of T-cell lymphoma presents with fever and joint pain after chemotherapy. Patient was also neutropenic at the time.  VITAL SIGNS:  Blood pressure 117/69, pulse 102, temperature 98.7 F (37.1 C), temperature source Oral, resp. rate 20, height 5\' 11"  (1.803 m), weight 91.944 kg (202 lb 11.2 oz), SpO2 98 %.    PHYSICAL EXAMINATION:  GENERAL:  74 y.o.-year-old patient lying in the bed with no acute distress.  EYES: Pupils equal, round, reactive to light and accommodation. No scleral icterus. Extraocular muscles intact.  HEENT: Head atraumatic, normocephalic. Oropharynx and nasopharynx clear.  NECK:  Supple, no jugular venous distention. No thyroid enlargement, no tenderness.  LUNGS: Normal breath sounds bilaterally, no wheezing, rales,rhonchi or crepitation. No use of accessory muscles of respiration.  CARDIOVASCULAR: S1, S2 normal. No murmurs, rubs, or gallops.  ABDOMEN: Soft, non-tender, non-distended. Bowel sounds present. No organomegaly or mass.  EXTREMITIES: No pedal edema, cyanosis, or clubbing.  NEUROLOGIC: Cranial nerves II through XII are intact. Muscle strength 5/5 in all extremities. Sensation intact. Gait not checked.  PSYCHIATRIC: The patient is alert and oriented x 3.  SKIN: No obvious rash, lesion, or ulcer.   DATA  REVIEW:   CBC  Recent Labs Lab 08/26/15 0501  WBC 20.3*  HGB 7.8*  HCT 24.2*  PLT 136*    Chemistries   Recent Labs Lab 08/24/15 2136  NA 137  K 4.0  CL 102  CO2 29  GLUCOSE 128*  BUN 21*  CREATININE 0.98  CALCIUM 8.4*  AST 16  ALT 22  ALKPHOS 94  BILITOT 0.4    Cardiac Enzymes  Recent Labs Lab 08/25/15 0842  TROPONINI 0.13*    Microbiology Results  Results for orders placed or performed during the hospital encounter of 08/24/15  Culture, blood (routine x 2)     Status: None (Preliminary result)   Collection Time: 08/24/15 11:42 PM  Result Value Ref Range Status   Specimen Description BLOOD RIGHT PORTA CATH  Final   Special Requests BOTTLES DRAWN AEROBIC AND ANAEROBIC 9ML  Final   Culture NO GROWTH 2 DAYS  Final   Report Status PENDING  Incomplete  Culture, blood (routine x 2)     Status: None (Preliminary result)   Collection Time: 08/24/15 11:42 PM  Result Value Ref Range Status   Specimen Description  BLOOD LEFT HAND  Final   Special Requests   Final    BOTTLES DRAWN AEROBIC AND ANAEROBIC 5MLAEROBIC,15MLANAEROBIC   Culture NO GROWTH 2 DAYS  Final   Report Status PENDING  Incomplete  Urine culture     Status: None (Preliminary result)   Collection Time: 08/25/15  3:54 PM  Result Value Ref Range Status   Specimen Description URINE, RANDOM  Final   Special Requests Immunocompromised  Final   Culture NO GROWTH < 24 HOURS  Final   Report Status PENDING  Incomplete    RADIOLOGY:  Dg Chest Portable 1 View  08/24/2015  CLINICAL DATA:  Chest pain, history of lymphoma with recent chemotherapy EXAM: PORTABLE CHEST - 1 VIEW COMPARISON:  08/04/2015 FINDINGS: Cardiac shadow is stable but mildly enlarged. A right chest wall port is again seen and stable. Minimal left basilar atelectasis is noted. No focal confluent infiltrate is seen. IMPRESSION: Minimal left basilar atelectasis. Electronically Signed   By: Inez Catalina M.D.   On: 08/24/2015 21:23     Management plans discussed with the patient, family and they are in agreement.  CODE STATUS:     Code Status Orders        Start     Ordered   08/25/15 0159  Full code   Continuous     08/25/15 0158      TOTAL TIME TAKING CARE OF THIS PATIENT: 35 minutes.    Loletha Grayer M.D on 08/26/2015 at 8:44 AM  Between 7am to 6pm - Pager - [phone]  After 6pm go to [email or web address] - password [email or web address] [facility]  Office  [phone]  CC: Primary care physician; [name]., MD

## 2015-08-26 NOTE — Discharge Instructions (Signed)
Neutropenia (Neutropenia) La neurtopenia es una alteracin que se produce cuando cierto tipo de glbulos blancos llamados neutrfilos tienen un nivel ms bajo que lo normal. Estas clulas se producen en la mdula sea y combaten las infecciones. Protegen contra las bacterias y los virus. Cunto ms bajo sea el nivel de neutrfilos y cuanto ms tiempo se mantenga bajo, ms riesgos tendr de sufrir una infeccin grave.  CAUSAS La causa de la neutropenia puede ser difcil de Teacher, adult education. Sin embargo, generalmente se debe a tres problemas principales:   Disminucin en la produccin de los neutrfilos. Puede deberse a:  Ciertos Research scientist (medical) quimioterapia.  Problemas genticos.  Cncer.  Tratamientos de radioterapia.  Dficit de vitaminas.  Algunos pesticidas.  Aumento de Advertising account planner de los neutrfilos. Puede deberse a:  Infecciones muy destructivas.  Anemia hemoltica. En este trastorno el organismo destruye sus propias glbulos rojos.  Quimioterapia.  Los neutrfilos movilizados a zonas del organismo donde no pueden Pension scheme manager las infecciones. Puede deberse a:  Procedimientos de dilisis.  Trastornos en los que se agranda el bazo. Los neutrfilos se mantienen en el bazo y no estn disponibles para combatir las infecciones en el resto del organismo.  Infecciones muy destructivas. Los neutrfilos se mantienen en la zona de la infeccin y no estn disponibles para combatir las infecciones en el resto del organismo. SNTOMAS No hay sntomas especficos para la neutropenia. La falta de neutrfilos puede causar una infeccin, y Ardelia Mems infeccin puede causar varios problemas.  DIAGNSTICO El diagnstico se realiza mediante anlisis de [place]. Para ello se realiza un recuento completo de Herbalist. El nivel normal de neutrfilos en la sangre humana vara con la edad y la raza. Los bebs tienen nivel ms bajo que los nios y [place]. Los afroamericanos tienen niveles ms bajos  que los caucsicos o asiticos. El nivel promedio de los adultos es de 1500 clulas/mm3 de [place]. La cantidad de neutrfilos (en clulas/mm3) se interpretan como sigue:  Ms de 1000 ofrece una proteccin contra cualquier infeccin.  Entre 500 y 1000 significa un mayor riesgo de infeccin.  Entre 200 y 500 es un riesgo mayor de infeccin severa.  Menos de 200 es un riesgo importante de infeccin. Esto puede requerir hospitalizacin y Clinical research associate con antibiticos. TRATAMIENTO El tratamiento depende de la causa subyacente, la gravedad y la presencia de infecciones o sntomas. Tambin depende de la salud del [place]. El mdico comentar el tratamiento con usted. Los casos leves suelen ser fciles de tratar y tienen un buen pronstico. Tambin pueden iniciarse medidas preventivas limitar el riesgo de infecciones. Las opciones de tratamiento son:  Tomar antibiticos.  Suspender los medicamentos que se sabe que provocan neutropenia.  Correccin de las deficiencias nutricionales consumiendo vegetales verdes para obtener cido flico, y suplementos de vitamina B.  Suspensin a la exposicin a pesticidas si su neutropenia se relaciona con la exposicin a estas sustancias.  Se debe administrar un factor de crecimiento de sangre llamado sargramostim, pegfilgrastim o filgrastim, si est recibiendo quimioterapia para el cncer. Estos medicamentos estimulan la produccin de glbulos blancos.  Extirpacin del bazo, si sufre el sndrome de [place] y tienen infecciones repetidas. INSTRUCCIONES PARA EL CUIDADO EN EL HOGAR   Sigue las indicaciones de su mdico acerca de cuando debe [employer] de [place].  Lave sus manos con frecuencia. Asegrese de los que que estn en contacto con usted tambin se laven las manos.  Amberg frutas y verduras crudas antes de comerlas. Pueden transmitir bacterias y hongos.  Evite a las personas resfriadas o con enfermedades que se transmiten (contagiosas)  (varicela, herpes zoster, influenza).  Evite las multitudes.  Evite las zonas de construccin. El polvo puede liberar hongos en el aire.  Tenga cuidado con los nios en guarderas o en la escuela.  Cuide el sistema respiratorio tosiendo y respirando profundamente.  [employer].  Proteja su piel de heridas y [place].  No trabaje en el jardn o con flores y plantas.  Cuide su boca antes y despus de las comidas cepillndose con un cepillo de [place]. Si usted tiene la mucositis, no use enjuague bucal. El enjuague bucal contiene alcohol y se pueden Radiographer, therapeutic boca an ms.  Limpie el rea entre los genitales y el ano (rea perineal) despus de Garment/textile technologist y mover el intestino. Las mujeres deben limpiarse de adelante hacia atrs.  Use un lubricante soluble en agua durante las relaciones sexuales e higiencese bien despus. No tenga relaciones sexuales si sufre neutropenia grave. Consulte con el profesional para que lo gue.  [place], segn lo que [place].  Evite a las Advertising copywriter fueron vacunadas con una vacuna con virus vivos durante los ltimos 30 das. Usted no debe recibir vacunas con virus vivos (poliomielitis, fiebre tifoidea).  No cuide directamente a sus mascotas. Evite los excrementos de los [place]. No limpie las cajas de arena y las [place] para pjaros.  No comparta utensilios personales.  No use tampones, enemas o supositorios rectales excepto que lo indique su mdico.  Use una rasuradora elctrica para depilarse.  Lvese las manos despus de [place], cartas y peridicos. SOLICITE ATENCIN MDICA DE INMEDIATO SI:   Tiene fiebre.  Siente escalofros y comienza a Higher education careers adviser.  Se siente nauseoso o vomita.  Tiene llagas en la boca.  Siente dolores y [place].  Observa enrojecimiento e hinchazn alrededor de heridas abiertas.  La piel est caliente al tacto.  Observa pus que proviene de la herida.  Los ganglios se  hinchan.  Se siente dbil o fatigado.  Tiene rayas rojas en la piel. ASEGRESE DE QUE:   Comprende estas instrucciones.  Controlar su enfermedad.  Solicitar ayuda de inmediato si no mejora o si empeora.   Esta informacin no tiene Marine scientist el consejo del mdico. Asegrese de hacerle al mdico cualquier pregunta que tenga.   Document Released: 06/12/2005 Document Revised: 11/25/2011 Elsevier Interactive Patient Education 2016 [place] en los adultos (Fever, Adult) La fiebre es el aumento de la Firefighter. A menudo se la define como una temperatura de 100F (38C) o ms. Los episodios de fiebre leve o moderada que duran poco tiempo no tienen efectos a Barrister's clerk y, en general, tampoco requieren Clinical research associate. Los episodios de fiebre moderada o alta pueden resultar molestos. A veces, tambin pueden ser un signo de una enfermedad grave. La sudoracin que se puede presentar cuando los episodios de fiebre son reiterados o duran algn tiempo tambin pueden llevar a que falte lquido en el organismo (deshidratacin). Puede usar un termmetro para tomarse la temperatura y saber si tiene fiebre. La medicin de la temperatura puede cambiar en funcin de los siguientes factores:  La edad.  La hora del da.  El lugar donde se coloca el termmetro:  La boca (oral).  El recto (rectal).  El odo (timpnico).  Debajo del brazo Art therapist).  La frente (temporal). CUIDADOS EN EL HOGAR Est atento a cualquier cambio en los sntomas. Tome estas medidas para controlar la afeccin:  [employer]  los medicamentos de venta libre y los recetados solamente como se lo haya indicado el mdico. Siga cuidadosamente las indicaciones en cuanto a la dosis.  Si le recetaron un antibitico, tmelo como se lo haya indicado el mdico. No deje de tomar los antibiticos aunque comience a Sports administrator.  Descanse todo lo que sea necesario.  Beba suficiente lquido para mantener el  pis (orina) claro o de color amarillo plido.  Higiencese con Reece Packer o dese un bao con agua a temperatura ambiente, si es necesario. Esto ayuda a Mining engineer. No use agua helada.  No se tape con muchas mantas ni use ropa abrigada. SOLICITE AYUDA SI:  Vomita.  No puede comer o tomar lquido sin vomitar.  La materia fecal es lquida (diarrea).  Siente dolor al [employer].  Los sntomas no mejoran con Dispensing optician.  Aparecen nuevos sntomas.  Se siente muy dbil. SOLICITE AYUDA DE INMEDIATO SI:  Siente falta de aire o dificultad para respirar.  Est mareado o se desvanece (se desmaya).  Se siente confundido.  Tiene signos de falta de lquido en el organismo, por ejemplo:  Sequedad en la boca.  Menor cantidad [place].  Palidez.  Tiene dolor muy intenso de vientre (abdomen).  No deja de vomitar o las heces son lquidas.  Le aparece una erupcin cutnea.  Los sntomas empeoran repentinamente.   Esta informacin no tiene Marine scientist el consejo del mdico. Asegrese de hacerle al mdico cualquier pregunta que tenga.   Document Released: 05/27/2012 Document Revised: 05/24/2015 Elsevier Interactive Patient Education [employer].

## 2015-08-26 NOTE — Progress Notes (Signed)
[place] @ [facility] Telephone:(336) [phone]  Fax:(336) [place] OB: 1940-10-30  MR#: [id number]  OEU#:[id number]  Patient Care Team: [name], MD as PCP - General (Internal Medicine)  CHIEF COMPLAINT:  Chief Complaint  Patient presents with  . Back Pain  . Abdominal Pain  . Fever  HISTORY OF PRESENTING ILLNESS:  Erik Shelton 74 y.o. 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 Grayland Ormond in the [place] is currently on chemotherapy with COEP (doxorubicin was admitted due to reaching a lifetime maximum dose.. Patient is currently status post 6 cycles D10; patient received Neulasta. Patient was admitted 2 days ago feeling tired, complaining of abdominal pain, with temperature 33F.   No history exists.    Oncology Flowsheet 07/28/2015 08/16/2015 08/17/2015 08/18/2015 08/24/2015 08/25/2015 08/26/2015  Day, Cycle Day 3, Cycle 5 Day 1, Cycle 6 Day 2, Cycle 6 Day 3, Cycle 6 - - -  cyclophosphamide (CYTOXAN) IV - 750 mg/m2 - - - - -  dexamethasone (DECADRON) IV [ 10 mg ] [ 10 mg ] [ 10 mg ] [ 10 mg ] - - -  DOXOrubicin (ADRIAMYCIN) IV - - - - - - -  enoxaparin (LOVENOX) [place] - - - - - 40 mg 40 mg  etoposide (VEPESID) IV 80 mg/m2 80 mg/m2 80 mg/m2 80 mg/m2 - - -  ondansetron (ZOFRAN) IV [ 8 mg ] [ 16 mg ] [ 8 mg ] [ 8 mg ] 4 mg - -  pegfilgrastim (NEULASTA ONPRO KIT) [place] 6 mg - - 6 mg - - -  vinCRIStine (ONCOVIN) IV - 2 mg - - - - -    INTERVAL HISTORY:  Since yesterday patient has felt significantly better. He feels stronger, denies any fever, nausea, vomiting, diarrhea. He denies any pain, he has good appetite today.  REVIEW OF SYSTEMS:   As per HPI. Otherwise, a complete review of systems is negatve.  PAST MEDICAL HISTORY: Past Medical History  Diagnosis Date  . Stroke ([place])   . Seizures ([place])   . Hypercholesteremia   . HTN (hypertension)   . Cancer ([place])      T-Cell Lymphoma  . Seizures ([place])     PAST SURGICAL HISTORY: Past Surgical History  Procedure Laterality Date  . Peripheral vascular catheterization N/A 04/24/2015    Procedure: Glori Luis Cath Insertion;  Surgeon: Algernon Huxley, MD;  Location: [place] [facility];  Service: Cardiovascular;  Laterality: N/A;  . Portacath placement      FAMILY HISTORY History reviewed. No pertinent family history.  ADVANCED DIRECTIVES:  No flowsheet data found.  HEALTH MAINTENANCE: Social History  Substance Use Topics  . Smoking status: Former Research scientist (life sciences)  . Smokeless tobacco: None     Comment: Quit  in 1990  . Alcohol Use: Yes     Comment: Occassional glass of wine      No Known Allergies  Current Facility-Administered Medications  Medication Dose Route Frequency Provider Last Rate Last Dose  . acetaminophen (TYLENOL) tablet 650 mg  650 mg Oral Q6H PRN Fritzi Mandes, MD       Or  . acetaminophen (TYLENOL) suppository 650 mg  650 mg Rectal Q6H PRN Fritzi Mandes, MD      . amoxicillin-clavulanate (AUGMENTIN) 875-125 MG per tablet 1 tablet  1 tablet Oral Q12H Loletha Grayer, MD      . aspirin EC tablet 325 mg  325  mg Oral Daily Fritzi Mandes, MD   325 mg at 08/26/15 0912  . enoxaparin (LOVENOX) injection 40 mg  40 mg Subcutaneous Q24H Fritzi Mandes, MD   40 mg at 08/26/15 0503  . heparin lock flush 100 unit/mL  500 Units Intravenous Once Loletha Grayer, MD      . losartan (COZAAR) tablet 50 mg  50 mg Oral Daily Fritzi Mandes, MD   50 mg at 08/26/15 0912  . morphine 2 MG/ML injection 1 mg  1 mg Intravenous Q4H PRN Fritzi Mandes, MD      . ondansetron (ZOFRAN) tablet 4 mg  4 mg Oral Q6H PRN Fritzi Mandes, MD       Or  . ondansetron (ZOFRAN) injection 4 mg  4 mg Intravenous Q6H PRN Fritzi Mandes, MD      . oxyCODONE-acetaminophen (PERCOCET/ROXICET) 5-325 MG per tablet 1 tablet  1 tablet Oral Q4H PRN Fritzi Mandes, MD      . pantoprazole (PROTONIX) EC tablet 40 mg  40 mg Oral Daily Fritzi Mandes, MD   40 mg at 08/26/15 0912  .  polyethylene glycol (MIRALAX / GLYCOLAX) packet 17 g  17 g Oral Daily PRN Loletha Grayer, MD      . pravastatin (PRAVACHOL) tablet 80 mg  80 mg Oral Daily Fritzi Mandes, MD   80 mg at 08/26/15 0932   Facility-Administered Medications Ordered in Other Encounters  Medication Dose Route Frequency Provider Last Rate Last Dose  . sodium chloride 0.9 % injection 10 mL  10 mL Intracatheter PRN Lloyd Huger, MD   10 mL at 07/05/15 0900    OBJECTIVE:  Filed Vitals:   08/26/15 0437 08/26/15 0826  BP: 112/62 117/69  Pulse: 97 102  Temp: 97.8 F (36.6 C) 98.7 F (37.1 C)  Resp:       Body mass index is 28.28 kg/(m^2).    ECOG FS:2 - Symptomatic, <50% confined to bed  PHYSICAL EXAM: GENERAL: Well-nourished well-developed Hispanic male; Alert, no distress and comfortable. Accompanied by his wife. He is lying in bed. EYES: no pallor or icterus OROPHARYNX: no thrush or ulceration; good dentition  NECK: supple, no masses felt LYMPH: no palpable lymphadenopathy in the cervical; positive for axillary adenopathy in the right underarm. LUNGS: clear to auscultation and No wheeze or crackles HEART/CVS: regular rate & rhythm and no murmurs; No lower extremity edema ABDOMEN: abdomen soft, non-tender and normal bowel sounds Musculoskeletal:no cyanosis of digits and no clubbing  PSYCH: alert & oriented x 3 with fluent speech NEURO: no focal motor/sensory deficits SKIN: no rashes or significant lesions  LAB RESULTS:  CBC Latest Ref Rng 08/26/2015 08/24/2015  WBC 3.8 - 10.6 K/uL 20.3(H) 2.9(L)  Hemoglobin 13.0 - 18.0 g/dL 7.8(L) 7.7(L)  Hematocrit 40.0 - 52.0 % 24.2(L) 23.7(L)  Platelets 150 - 440 K/uL 136(L) 156    Admission on 08/24/2015  Component Date Value Ref Range Status  . WBC 08/24/2015 2.9* 3.8 - 10.6 K/uL Final  . RBC 08/24/2015 2.57* 4.40 - 5.90 MIL/uL Final  . Hemoglobin 08/24/2015 7.7* 13.0 - 18.0 g/dL Final  . HCT 08/24/2015 23.7* 40.0 - 52.0 % Final  . MCV 08/24/2015  92.1  80.0 - 100.0 fL Final  . MCH 08/24/2015 29.8  26.0 - 34.0 pg Final  . MCHC 08/24/2015 32.4  32.0 - 36.0 g/dL Final  . RDW 08/24/2015 19.2* 11.5 - 14.5 % Final  . Platelets 08/24/2015 156  150 - 440 K/uL Final  . Neutrophils Relative % 08/24/2015 20  Final  . Lymphocytes Relative 08/24/2015 38   Final  . Monocytes Relative 08/24/2015 21   Final  . Eosinophils Relative 08/24/2015 0   Final  . Basophils Relative 08/24/2015 0   Final  . Band Neutrophils 08/24/2015 10   Final  . Metamyelocytes Relative 08/24/2015 3   Final  . Myelocytes 08/24/2015 5   Final  . Promyelocytes Absolute 08/24/2015 1   Final  . Blasts 08/24/2015 0   Final  . nRBC 08/24/2015 3* 0 /100 WBC Final  . Other 08/24/2015 2   Final  . Neutro Abs 08/24/2015 1.1* 1.4 - 6.5 K/uL Final  . Lymphs Abs 08/24/2015 1.1  1.0 - 3.6 K/uL Final  . Monocytes Absolute 08/24/2015 0.6  0.2 - 1.0 K/uL Final  . Eosinophils Absolute 08/24/2015 0.0  0 - 0.7 K/uL Final  . Basophils Absolute 08/24/2015 0.0  0 - 0.1 K/uL Final  . WBC Morphology 08/24/2015 TOXIC GRANULATION   Final   DOHLE BODIES  . Smear Review 08/24/2015 LARGE PLATELETS PRESENT   Final   PENDING PATHOLOGIST REVIEW  . Sodium 08/24/2015 137  135 - 145 mmol/L Final  . Potassium 08/24/2015 4.0  3.5 - 5.1 mmol/L Final  . Chloride 08/24/2015 102  101 - 111 mmol/L Final  . CO2 08/24/2015 29  22 - 32 mmol/L Final  . Glucose, Bld 08/24/2015 128* 65 - 99 mg/dL Final  . BUN 08/24/2015 21* 6 - 20 mg/dL Final  . Creatinine, Ser 08/24/2015 0.98  0.61 - 1.24 mg/dL Final  . Calcium 08/24/2015 8.4* 8.9 - 10.3 mg/dL Final  . Total Protein 08/24/2015 5.4* 6.5 - 8.1 g/dL Final  . Albumin 08/24/2015 3.2* 3.5 - 5.0 g/dL Final  . AST 08/24/2015 16  15 - 41 U/L Final  . ALT 08/24/2015 22  17 - 63 U/L Final  . Alkaline Phosphatase 08/24/2015 94  38 - 126 U/L Final  . Total Bilirubin 08/24/2015 0.4  0.3 - 1.2 mg/dL Final  . GFR calc non Af Amer 08/24/2015 >60  >60 mL/min Final  . GFR  calc Af Amer 08/24/2015 >60  >60 mL/min Final   Comment: (NOTE) The eGFR has been calculated using the CKD EPI equation. This calculation has not been validated in all clinical situations. eGFR's persistently <60 mL/min signify possible Chronic Kidney Disease.   . Anion gap 08/24/2015 6  5 - 15 Final  . Lipase 08/24/2015 34  11 - 51 U/L Final  . Troponin I 08/24/2015 0.14* <0.031 ng/mL Final   Comment: READ BACK AND VERIFIED WITH KATE BUMGARNER AT 2217 08/24/2015 BY TFK        PERSISTENTLY INCREASED TROPONIN VALUES IN THE RANGE OF 0.04-0.49 ng/mL CAN BE SEEN IN:       -UNSTABLE ANGINA       -CONGESTIVE HEART FAILURE       -MYOCARDITIS       -CHEST TRAUMA       -ARRYHTHMIAS       -LATE PRESENTING MYOCARDIAL INFARCTION       -COPD   CLINICAL FOLLOW-UP RECOMMENDED.   Marland Kitchen Color, Urine 08/25/2015 YELLOW* YELLOW Final  . APPearance 08/25/2015 CLEAR* CLEAR Final  . Glucose, UA 08/25/2015 NEGATIVE  NEGATIVE mg/dL Final  . Bilirubin Urine 08/25/2015 NEGATIVE  NEGATIVE Final  . Ketones, ur 08/25/2015 NEGATIVE  NEGATIVE mg/dL Final  . Specific Gravity, Urine 08/25/2015 1.019  1.005 - 1.030 Final  . Hgb urine dipstick 08/25/2015 NEGATIVE  NEGATIVE Final  . pH 08/25/2015 6.0  5.0 - 8.0 Final  . Protein, ur 08/25/2015 NEGATIVE  NEGATIVE mg/dL Final  . Nitrite 08/25/2015 NEGATIVE  NEGATIVE Final  . Leukocytes, UA 08/25/2015 NEGATIVE  NEGATIVE Final  . RBC / HPF 08/25/2015 0-5  0 - 5 RBC/hpf Final  . WBC, UA 08/25/2015 0-5  0 - 5 WBC/hpf Final  . Bacteria, UA 08/25/2015 NONE SEEN  NONE SEEN Final  . Squamous Epithelial / LPF 08/25/2015 NONE SEEN  NONE SEEN Final  . Lactic Acid, Venous 08/24/2015 1.1  0.5 - 2.0 mmol/L Final  . Lactic Acid, Venous 08/25/2015 1.1  0.5 - 2.0 mmol/L Final  . Specimen Description 08/24/2015 BLOOD RIGHT PORTA CATH   Final  . Special Requests 08/24/2015 BOTTLES DRAWN AEROBIC AND ANAEROBIC 9ML   Final  . Culture 08/24/2015 NO GROWTH 2 DAYS   Final  . Report Status  08/24/2015 PENDING   Incomplete  . Specimen Description 08/24/2015 BLOOD LEFT HAND   Final  . Special Requests 08/24/2015 BOTTLES DRAWN AEROBIC AND ANAEROBIC 5MLAEROBIC,15MLANAEROBIC   Final  . Culture 08/24/2015 NO GROWTH 2 DAYS   Final  . Report Status 08/24/2015 PENDING   Incomplete  . Path Review 08/24/2015 Peripheral blood smear with leukopenia and normocytic anemia. Myeloid series with slight shift in maturity.   Final   Comment: Occasional nucleated RBCs. Findings consistent with recent chemotherapy. Reviewed by Dellia Nims Rubinas, M.D.   . Troponin I 08/25/2015 0.15* <0.031 ng/mL Final   Comment: PREVIOUS RESULT CALLED TO KATE BUMGARNER AT 2217 ON 08/24/15 BY TFK.Marland KitchenMarland KitchenSedgwick        PERSISTENTLY INCREASED TROPONIN VALUES IN THE RANGE OF 0.04-0.49 ng/mL CAN BE SEEN IN:       -UNSTABLE ANGINA       -CONGESTIVE HEART FAILURE       -MYOCARDITIS       -CHEST TRAUMA       -ARRYHTHMIAS       -LATE PRESENTING MYOCARDIAL INFARCTION       -COPD   CLINICAL FOLLOW-UP RECOMMENDED.   Marland Kitchen Troponin I 08/25/2015 0.13* <0.031 ng/mL Final   Comment: PREVIOUS RESULT CALLED TO KATE BUMGARNER ON12/8/16 AT 2217 [place]        PERSISTENTLY INCREASED TROPONIN VALUES IN THE RANGE OF 0.04-0.49 ng/mL CAN BE SEEN IN:       -UNSTABLE ANGINA       -CONGESTIVE HEART FAILURE       -MYOCARDITIS       -CHEST TRAUMA       -ARRYHTHMIAS       -LATE PRESENTING MYOCARDIAL INFARCTION       -COPD   CLINICAL FOLLOW-UP RECOMMENDED.   Marland Kitchen Specimen Description 08/25/2015 URINE, RANDOM   Final  . Special Requests 08/25/2015 Immunocompromised   Final  . Culture 08/25/2015 NO GROWTH < 24 HOURS   Final  . Report Status 08/25/2015 PENDING   Incomplete  . WBC 08/26/2015 20.3* 3.8 - 10.6 K/uL Final  . RBC 08/26/2015 2.60* 4.40 - 5.90 MIL/uL Final  . Hemoglobin 08/26/2015 7.8* 13.0 - 18.0 g/dL Final  . HCT 08/26/2015 24.2* 40.0 - 52.0 % Final  . MCV 08/26/2015 92.8  80.0 - 100.0 fL Final  . MCH 08/26/2015 29.8  26.0 - 34.0 pg  Final  . MCHC 08/26/2015 32.1  32.0 - 36.0 g/dL Final  . RDW 08/26/2015 18.5* 11.5 - 14.5 % Final  . Platelets 08/26/2015 136* 150 - 440 K/uL Final       STUDIES: Dg Chest 2 View  08/04/2015  CLINICAL  DATA:  Patient brought in by [facility], EMS was called out for fall and possible seizure like activity with incontience. Patient has temp of 102.6 per EMS. Heart rate 140, BP 156/82, CBG 164. Patient has a hx/o lymphoma, last chemo treatment was Thursday. EXAM: CHEST  2 VIEW COMPARISON:  12/10/2012 FINDINGS: Lung volumes are low leading to crowding of the bronchovascular structures. There is no lung consolidation to suggest lobar pneumonia and no convincing pulmonary edema. No pleural effusion or pneumothorax. Cardiac silhouette is top-normal in size. No mediastinal or hilar masses or convincing adenopathy. Right anterior chest wall Port-A-Cath has its tip at the caval atrial junction, new since the prior study. Bony thorax is demineralized but intact. IMPRESSION: No acute cardiopulmonary disease. Electronically Signed   By: Lajean Manes M.D.   On: 08/04/2015 12:46   Ct Head Wo Contrast  08/04/2015  CLINICAL DATA:  Fall, possible seizure. EXAM: CT HEAD WITHOUT CONTRAST TECHNIQUE: Contiguous axial images were obtained from the base of the skull through the vertex without intravenous contrast. COMPARISON:  None. FINDINGS: Bony calvarium appears intact. Minimal diffuse cortical atrophy is noted. Mild chronic ischemic white matter disease is noted. No mass effect or midline shift is noted. Ventricular size is within normal limits. There is no evidence of mass lesion, hemorrhage or acute infarction. IMPRESSION: Minimal diffuse cortical atrophy. Mild chronic ischemic white matter disease. No acute intracranial abnormality seen. Electronically Signed   By: Marijo Conception, M.D.   On: 08/04/2015 12:49   Dg Chest Portable 1 View  08/24/2015  CLINICAL DATA:  Chest pain, history of lymphoma with recent  chemotherapy EXAM: PORTABLE CHEST - 1 VIEW COMPARISON:  08/04/2015 FINDINGS: Cardiac shadow is stable but mildly enlarged. A right chest wall port is again seen and stable. Minimal left basilar atelectasis is noted. No focal confluent infiltrate is seen. IMPRESSION: Minimal left basilar atelectasis. Electronically Signed   By: Inez Catalina M.D.   On: 08/24/2015 21:23    ASSESSMENT & PLAN:   74 year old male patient with above history of peripheral T-cell lymphoma NOS stage IV- currently on chemotherapy with C(H)OEP status post cycle #6 day #11 currently admitted to the hospital for failure to thrive, fatigue, generalized abdominal pain, was found to have temperature of 55F and leukopenia.  # Failure to thrive, fatigue- patient has improved significantly on IV antibiotics, white blood cell count has completely recovered, patient expressed desire to go home. I reviewed chest x-ray images, CBC, blood and urine cultures. So far  there has been no obvious infection found. This appears to be a safe discharge, he will go home with Augmentin to complete 7 days of treatment. We will follow the results of the blood cultures and urine cultures, and if needed, will adjust the treatment. He has a follow-up appointment with Dr. Grayland Ormond next week.   # Peripheral T-cell lymphoma- on chemotherapy cycle # 6 day # 11; status post Neulasta; no need for Neupogen at this time , white blood cell count has completely recovered. He will discuss the plans for further chemotherapy with Dr. Grayland Ormond.    No matching staging information was found for the patient.  Roxana Hires, MD   08/26/2015 10:19 AM

## 2015-08-27 LAB — URINE CULTURE

## 2015-08-29 LAB — CULTURE, BLOOD (ROUTINE X 2)
CULTURE: NO GROWTH
Culture: NO GROWTH

## 2015-09-06 ENCOUNTER — Ambulatory Visit: Payer: Medicare Other

## 2015-09-06 ENCOUNTER — Other Ambulatory Visit: Payer: Medicare Other

## 2015-09-06 ENCOUNTER — Inpatient Hospital Stay (HOSPITAL_BASED_OUTPATIENT_CLINIC_OR_DEPARTMENT_OTHER): Payer: Medicare Other | Admitting: Oncology

## 2015-09-06 ENCOUNTER — Ambulatory Visit: Payer: Medicare Other | Admitting: Oncology

## 2015-09-06 ENCOUNTER — Inpatient Hospital Stay: Payer: Medicare Other

## 2015-09-06 VITALS — BP 129/76 | HR 103 | Temp 98.3°F | Resp 16 | Wt 193.6 lb

## 2015-09-06 DIAGNOSIS — Z7982 Long term (current) use of aspirin: Secondary | ICD-10-CM | POA: Diagnosis not present

## 2015-09-06 DIAGNOSIS — C8448 Peripheral T-cell lymphoma, not classified, lymph nodes of multiple sites: Secondary | ICD-10-CM | POA: Diagnosis not present

## 2015-09-06 DIAGNOSIS — D6481 Anemia due to antineoplastic chemotherapy: Secondary | ICD-10-CM

## 2015-09-06 DIAGNOSIS — I10 Essential (primary) hypertension: Secondary | ICD-10-CM | POA: Diagnosis not present

## 2015-09-06 DIAGNOSIS — T451X5S Adverse effect of antineoplastic and immunosuppressive drugs, sequela: Secondary | ICD-10-CM

## 2015-09-06 DIAGNOSIS — Z8673 Personal history of transient ischemic attack (TIA), and cerebral infarction without residual deficits: Secondary | ICD-10-CM

## 2015-09-06 DIAGNOSIS — D72828 Other elevated white blood cell count: Secondary | ICD-10-CM | POA: Diagnosis not present

## 2015-09-06 DIAGNOSIS — Z79899 Other long term (current) drug therapy: Secondary | ICD-10-CM | POA: Diagnosis not present

## 2015-09-06 DIAGNOSIS — M791 Myalgia: Secondary | ICD-10-CM | POA: Diagnosis not present

## 2015-09-06 DIAGNOSIS — E7800 Pure hypercholesterolemia, unspecified: Secondary | ICD-10-CM | POA: Diagnosis not present

## 2015-09-06 DIAGNOSIS — Z87891 Personal history of nicotine dependence: Secondary | ICD-10-CM

## 2015-09-06 DIAGNOSIS — Z7689 Persons encountering health services in other specified circumstances: Secondary | ICD-10-CM | POA: Diagnosis not present

## 2015-09-06 DIAGNOSIS — C8442 Peripheral T-cell lymphoma, not classified, intrathoracic lymph nodes: Secondary | ICD-10-CM

## 2015-09-06 DIAGNOSIS — Z5111 Encounter for antineoplastic chemotherapy: Secondary | ICD-10-CM | POA: Diagnosis not present

## 2015-09-06 LAB — CBC WITH DIFFERENTIAL/PLATELET
BASOS PCT: 0 %
Basophils Absolute: 0 10*3/uL (ref 0–0.1)
EOS ABS: 0.1 10*3/uL (ref 0–0.7)
EOS PCT: 1 %
HCT: 29.7 % — ABNORMAL LOW (ref 40.0–52.0)
HEMOGLOBIN: 9.5 g/dL — AB (ref 13.0–18.0)
LYMPHS ABS: 1.5 10*3/uL (ref 1.0–3.6)
Lymphocytes Relative: 12 %
MCH: 29.2 pg (ref 26.0–34.0)
MCHC: 32 g/dL (ref 32.0–36.0)
MCV: 91.4 fL (ref 80.0–100.0)
MONO ABS: 0.8 10*3/uL (ref 0.2–1.0)
MONOS PCT: 6 %
Neutro Abs: 10.3 10*3/uL — ABNORMAL HIGH (ref 1.4–6.5)
Neutrophils Relative %: 81 %
PLATELETS: 357 10*3/uL (ref 150–440)
RBC: 3.25 MIL/uL — ABNORMAL LOW (ref 4.40–5.90)
RDW: 18.6 % — AB (ref 11.5–14.5)
WBC: 12.7 10*3/uL — ABNORMAL HIGH (ref 3.8–10.6)

## 2015-09-06 LAB — COMPREHENSIVE METABOLIC PANEL
ALK PHOS: 153 U/L — AB (ref 38–126)
ALT: 22 U/L (ref 17–63)
AST: 18 U/L (ref 15–41)
Albumin: 3.7 g/dL (ref 3.5–5.0)
Anion gap: 10 (ref 5–15)
BUN: 13 mg/dL (ref 6–20)
CALCIUM: 8.8 mg/dL — AB (ref 8.9–10.3)
CHLORIDE: 100 mmol/L — AB (ref 101–111)
CO2: 23 mmol/L (ref 22–32)
CREATININE: 0.69 mg/dL (ref 0.61–1.24)
GFR calc Af Amer: >60 mL/min (ref >60)
GFR calc non Af Amer: >60 mL/min (ref >60)
GLUCOSE: 141 mg/dL — AB (ref 65–99)
Potassium: 4 mmol/L (ref 3.5–5.1)
SODIUM: 133 mmol/L — AB (ref 135–145)
Total Bilirubin: 0.3 mg/dL (ref 0.3–1.2)
Total Protein: 6.7 g/dL (ref 6.5–8.1)

## 2015-09-06 MED ORDER — SODIUM CHLORIDE 0.9 % IV SOLN
Freq: Once | INTRAVENOUS | Status: AC
  Administered 2015-09-06: 10:00:00 via INTRAVENOUS
  Filled 2015-09-06: qty 1000

## 2015-09-06 MED ORDER — SODIUM CHLORIDE 0.9 % IV SOLN
80.0000 mg/m2 | Freq: Once | INTRAVENOUS | Status: AC
  Administered 2015-09-06: 170 mg via INTRAVENOUS
  Filled 2015-09-06: qty 8.5

## 2015-09-06 MED ORDER — SODIUM CHLORIDE 0.9 % IV SOLN
2.0000 mg | Freq: Once | INTRAVENOUS | Status: AC
  Administered 2015-09-06: 2 mg via INTRAVENOUS
  Filled 2015-09-06: qty 2

## 2015-09-06 MED ORDER — SODIUM CHLORIDE 0.9 % IV SOLN
750.0000 mg/m2 | Freq: Once | INTRAVENOUS | Status: AC
  Administered 2015-09-06: 1620 mg via INTRAVENOUS
  Filled 2015-09-06: qty 81

## 2015-09-06 MED ORDER — HEPARIN SOD (PORK) LOCK FLUSH 100 UNIT/ML IV SOLN
500.0000 [IU] | Freq: Once | INTRAVENOUS | Status: AC
  Administered 2015-09-06: 500 [IU] via INTRAVENOUS

## 2015-09-06 MED ORDER — SODIUM CHLORIDE 0.9 % IV SOLN
Freq: Once | INTRAVENOUS | Status: AC
  Administered 2015-09-06: 10:00:00 via INTRAVENOUS
  Filled 2015-09-06: qty 8

## 2015-09-06 MED ORDER — SODIUM CHLORIDE 0.9 % IJ SOLN
10.0000 mL | INTRAMUSCULAR | Status: DC | PRN
  Filled 2015-09-06: qty 10

## 2015-09-06 NOTE — Progress Notes (Signed)
After last treatment patient had a temperature of 99 with back pain so they decided to go to ER.

## 2015-09-06 NOTE — Progress Notes (Signed)
[place]  Telephone:(336) [phone] Fax:(336) [phone]  ID: Erik Shelton OB: 04-15-1941  MR#: [id number]  [id number]  Patient Care Team: [name], MD as PCP - General (Internal Medicine)  CHIEF COMPLAINT:  Chief Complaint  Patient presents with   Lymphoma    INTERVAL HISTORY: Patient returns to clinic today for further evaluation and consideration of cycle 7 of etoposide plus CVP. He was admitted to the hospital recently with increasing weakness as well as neutropenic fever. No obvious infectious source was identified. Patient now feels improved and is back to his baseline. He denies any further fevers.  He does not complain of weakness or fatigue today. He does complain of intermittent left side pain today. He does not complain of nausea today and his appetite is improved. He has no chest pain or shortness of breath. He denies any vomiting, constipation, or diarrhea. Patient offers no further specific complaints today.  REVIEW OF SYSTEMS:   Review of Systems  Constitutional: Negative for fever and malaise/fatigue.  Respiratory: Negative.   Cardiovascular: Negative.   Gastrointestinal: Negative for nausea and abdominal pain.  Musculoskeletal: Positive for pain.   Neurological: Negative.  Negative for weakness.  Endo/Heme/Allergies: Does not bruise/bleed easily.    As per HPI. Otherwise, a complete review of systems is negatve.  PAST MEDICAL HISTORY: Past Medical History  Diagnosis Date   Stroke [facility])    Seizures ([place])    Hypercholesteremia    HTN (hypertension)    Cancer ([facility])     T-Cell Lymphoma   Seizures ([place])     PAST SURGICAL HISTORY: Past Surgical History  Procedure Laterality Date   Peripheral vascular catheterization N/A 04/24/2015    Procedure: Porta Cath Insertion;  Surgeon: Algernon Huxley, MD;  Location: [place] [facility];  Service: Cardiovascular;  Laterality: N/A;   Portacath placement      FAMILY HISTORY:  Reviewed and unchanged. No reported history of malignancy or chronic disease.     ADVANCED DIRECTIVES:    HEALTH MAINTENANCE: Social History  Substance Use Topics   Smoking status: Former Smoker   Smokeless tobacco: Not on file     Comment: Quit  in 1990   Alcohol Use: Yes     Comment: Occassional glass of wine     Colonoscopy:  PAP:  Bone density:  Lipid panel:  No Known Allergies  Current Outpatient Prescriptions  Medication Sig Dispense Refill   acetaminophen (TYLENOL) 325 MG tablet Take 650 mg by mouth every 6 (six) hours as needed for mild pain, moderate pain, fever or headache.     amoxicillin-clavulanate (AUGMENTIN) 875-125 MG tablet Take 1 tablet by mouth every 12 (twelve) hours. 10 tablet 0   aspirin EC 325 MG tablet Take 325 mg by mouth daily.     ferrous sulfate (FERROUSUL) 325 (65 FE) MG tablet Take 1 tablet (325 mg total) by mouth daily with breakfast. 30 tablet 0   lidocaine-prilocaine (EMLA) cream Apply 1 application topically as needed. 30 g 2   losartan (COZAAR) 50 MG tablet Take 50 mg by mouth daily.     omeprazole (PRILOSEC) 40 MG capsule Take 1 capsule (40 mg total) by mouth daily. 30 capsule 2   oxyCODONE-acetaminophen (ROXICET) 5-325 MG per tablet Take 1 tablet by mouth every 4 (four) hours as needed for severe pain. 60 tablet 0   polyethylene glycol (MIRALAX / GLYCOLAX) packet Take 17 g by mouth daily as needed for moderate constipation. 30 each 0   pravastatin (PRAVACHOL)  80 MG tablet Take 80 mg by mouth daily.     predniSONE (DELTASONE) 20 MG tablet Take 4.5 tablets (90mg ) daily on days 1 - 5 of chemotherapy 25 tablet 6   No current facility-administered medications for this visit.   Facility-Administered Medications Ordered in Other Visits  Medication Dose Route Frequency Provider Last Rate Last Dose   0.9 %  sodium chloride infusion   Intravenous Once Lloyd Huger, MD       cyclophosphamide (CYTOXAN) 1,620 mg in sodium  chloride 0.9 % 250 mL chemo infusion  750 mg/m2 (Treatment Plan Actual) Intravenous Once Lloyd Huger, MD       etoposide (VEPESID) 170 mg in sodium chloride 0.9 % 500 mL chemo infusion  80 mg/m2 (Treatment Plan Actual) Intravenous Once Lloyd Huger, MD       sodium chloride 0.9 % injection 10 mL  10 mL Intracatheter PRN Lloyd Huger, MD   10 mL at 07/05/15 0900    OBJECTIVE: Filed Vitals:   09/06/15 0910  BP: 129/76  Pulse: 103  Temp: 98.3 F (36.8 C)  Resp: 16     Body mass index is 27.01 kg/(m^2).    ECOG FS:0 - Asymptomatic  General: Well-developed, well-nourished, no acute distress. Eyes: Pink conjunctiva, anicteric sclera. Lungs: Clear to auscultation bilaterally. Heart: Regular rate and rhythm. No rubs, murmurs, or gallops. Abdomen: Soft, nontender, nondistended. No organomegaly noted, normoactive bowel sounds. Musculoskeletal: No edema, cyanosis, or clubbing. Neuro: Alert, answering all questions appropriately. Cranial nerves grossly intact. Skin: No rashes or petechiae noted. Psych: Normal affect. Lymphatics: No palpable lymphadenopathy.   LAB RESULTS:  Lab Results  Component Value Date   NA 133* 09/06/2015   K 4.0 09/06/2015   CL 100* 09/06/2015   CO2 23 09/06/2015   GLUCOSE 141* 09/06/2015   BUN 13 09/06/2015   CREATININE 0.69 09/06/2015   CALCIUM 8.8* 09/06/2015   PROT 6.7 09/06/2015   ALBUMIN 3.7 09/06/2015   AST 18 09/06/2015   ALT 22 09/06/2015   ALKPHOS 153* 09/06/2015   BILITOT 0.3 09/06/2015   GFRNONAA >60 09/06/2015   GFRAA >60 09/06/2015    Lab Results  Component Value Date   WBC 12.7* 09/06/2015   NEUTROABS 10.3* 09/06/2015   HGB 9.5* 09/06/2015   HCT 29.7* 09/06/2015   MCV 91.4 09/06/2015   PLT 357 09/06/2015     STUDIES: Dg Chest Portable 1 View  08/24/2015  CLINICAL DATA:  Chest pain, history of lymphoma with recent chemotherapy EXAM: PORTABLE CHEST - 1 VIEW COMPARISON:  08/04/2015 FINDINGS: Cardiac shadow is  stable but mildly enlarged. A right chest wall port is again seen and stable. Minimal left basilar atelectasis is noted. No focal confluent infiltrate is seen. IMPRESSION: Minimal left basilar atelectasis. Electronically Signed   By: Inez Catalina M.D.   On: 08/24/2015 21:23    ASSESSMENT: Recurrent stage IV T-cell lymphoma.  PLAN:    1. T-cell lymphoma: Proceed with cycle 7 of CVP plus etoposide. He has now reached his lifetime dose of Adriamycin and this will be dropped from his regimen. Continue with Onpro Neulasta on day 3 of treatment. Return to clinic in 1 and 2 days for etoposide only and then in 3 weeks for consideration of cycle 8. Will reimage with PET in approximately February 2017.  2. Leukocytosis: Secondary to OnPro Neulasta as above. 3. Anemia: Secondary to chemotherapy, monitor. 4. Neutropenic fevers: Resolved. Patient completed empiric course of antibiotics. 5. Pain: Monitor, if worsens possibly  consider imaging prior to February.  Patient expressed understanding and was in agreement with this plan. He also understands that He can call clinic at any time with any questions, concerns, or complaints.   Graylon Gunning [place]   09/06/2015 10:53 AM   Patient was seen and evaluated and I agree with the assessment and plan as above.

## 2015-09-07 ENCOUNTER — Inpatient Hospital Stay: Payer: Medicare Other

## 2015-09-07 VITALS — BP 113/69 | HR 100 | Resp 18

## 2015-09-07 DIAGNOSIS — C8448 Peripheral T-cell lymphoma, not classified, lymph nodes of multiple sites: Secondary | ICD-10-CM | POA: Diagnosis not present

## 2015-09-07 MED ORDER — SODIUM CHLORIDE 0.9 % IV SOLN
80.0000 mg/m2 | Freq: Once | INTRAVENOUS | Status: AC
  Administered 2015-09-07: 170 mg via INTRAVENOUS
  Filled 2015-09-07: qty 8.5

## 2015-09-07 MED ORDER — HEPARIN SOD (PORK) LOCK FLUSH 100 UNIT/ML IV SOLN
INTRAVENOUS | Status: AC
  Filled 2015-09-07: qty 5

## 2015-09-07 MED ORDER — SODIUM CHLORIDE 0.9 % IV SOLN
Freq: Once | INTRAVENOUS | Status: AC
  Administered 2015-09-07: 14:00:00 via INTRAVENOUS
  Filled 2015-09-07: qty 4

## 2015-09-08 ENCOUNTER — Inpatient Hospital Stay: Payer: Medicare Other

## 2015-09-08 ENCOUNTER — Telehealth: Payer: Self-pay | Admitting: *Deleted

## 2015-09-08 VITALS — BP 123/73 | HR 90 | Temp 96.2°F | Resp 18

## 2015-09-08 DIAGNOSIS — C8448 Peripheral T-cell lymphoma, not classified, lymph nodes of multiple sites: Secondary | ICD-10-CM

## 2015-09-08 MED ORDER — HEPARIN SOD (PORK) LOCK FLUSH 100 UNIT/ML IV SOLN
INTRAVENOUS | Status: AC
Start: 2015-09-08 — End: 2015-09-08
  Filled 2015-09-08: qty 5

## 2015-09-08 MED ORDER — SODIUM CHLORIDE 0.9 % IV SOLN
Freq: Once | INTRAVENOUS | Status: AC
  Administered 2015-09-08: 11:00:00 via INTRAVENOUS
  Filled 2015-09-08: qty 4

## 2015-09-08 MED ORDER — FERROUS SULFATE 325 (65 FE) MG PO TABS: 325.0000 mg | ORAL_TABLET | Freq: Every day | ORAL | Status: DC

## 2015-09-08 MED ORDER — SODIUM CHLORIDE 0.9 % IV SOLN
INTRAVENOUS | Status: DC
  Administered 2015-09-08: 11:00:00 via INTRAVENOUS
  Filled 2015-09-08: qty 1000

## 2015-09-08 MED ORDER — PEGFILGRASTIM 6 MG/0.6ML ~~LOC~~ PSKT
6.0000 mg | PREFILLED_SYRINGE | Freq: Once | SUBCUTANEOUS | Status: AC
  Administered 2015-09-08: 6 mg via SUBCUTANEOUS
  Filled 2015-09-08: qty 0.6

## 2015-09-08 MED ORDER — SODIUM CHLORIDE 0.9 % IV SOLN
80.0000 mg/m2 | Freq: Once | INTRAVENOUS | Status: AC
  Administered 2015-09-08: 170 mg via INTRAVENOUS
  Filled 2015-09-08: qty 8.5

## 2015-09-08 MED ORDER — HEPARIN SOD (PORK) LOCK FLUSH 100 UNIT/ML IV SOLN: 500.0000 [IU] | Freq: Once | INTRAVENOUS | Status: DC

## 2015-09-08 NOTE — Telephone Encounter (Signed)
Escribed

## 2015-09-14 ENCOUNTER — Telehealth: Payer: Self-pay | Admitting: *Deleted

## 2015-09-14 NOTE — Telephone Encounter (Signed)
Called to erport that he has had 4 diarrhea stools since last evening even awakening him during night. He feels weak in the knees and is nauseated. Was asking if he could take Pepto, but I advised he not take that but to get some Imodium AD and that I would discuss with MD and call her back.

## 2015-09-14 NOTE — Telephone Encounter (Signed)
Per Dr Derry Lory he is to use the Imodium AD and instructed to call back if gets worse or no better

## 2015-09-27 ENCOUNTER — Inpatient Hospital Stay: Payer: Medicare Other | Attending: Oncology | Admitting: Oncology

## 2015-09-27 ENCOUNTER — Inpatient Hospital Stay: Payer: Medicare Other

## 2015-09-27 VITALS — BP 142/86 | HR 110 | Temp 98.4°F | Wt 195.1 lb

## 2015-09-27 VITALS — BP 112/74 | HR 94

## 2015-09-27 DIAGNOSIS — R05 Cough: Secondary | ICD-10-CM | POA: Diagnosis not present

## 2015-09-27 DIAGNOSIS — Z79899 Other long term (current) drug therapy: Secondary | ICD-10-CM | POA: Diagnosis not present

## 2015-09-27 DIAGNOSIS — T451X5S Adverse effect of antineoplastic and immunosuppressive drugs, sequela: Secondary | ICD-10-CM | POA: Diagnosis not present

## 2015-09-27 DIAGNOSIS — C8442 Peripheral T-cell lymphoma, not classified, intrathoracic lymph nodes: Secondary | ICD-10-CM

## 2015-09-27 DIAGNOSIS — C8448 Peripheral T-cell lymphoma, not classified, lymph nodes of multiple sites: Secondary | ICD-10-CM | POA: Diagnosis not present

## 2015-09-27 DIAGNOSIS — Z5111 Encounter for antineoplastic chemotherapy: Secondary | ICD-10-CM | POA: Diagnosis not present

## 2015-09-27 DIAGNOSIS — D6481 Anemia due to antineoplastic chemotherapy: Secondary | ICD-10-CM

## 2015-09-27 DIAGNOSIS — Z7689 Persons encountering health services in other specified circumstances: Secondary | ICD-10-CM | POA: Diagnosis not present

## 2015-09-27 DIAGNOSIS — Z7982 Long term (current) use of aspirin: Secondary | ICD-10-CM

## 2015-09-27 DIAGNOSIS — R062 Wheezing: Secondary | ICD-10-CM

## 2015-09-27 DIAGNOSIS — E7800 Pure hypercholesterolemia, unspecified: Secondary | ICD-10-CM | POA: Diagnosis not present

## 2015-09-27 DIAGNOSIS — I10 Essential (primary) hypertension: Secondary | ICD-10-CM

## 2015-09-27 DIAGNOSIS — Z87891 Personal history of nicotine dependence: Secondary | ICD-10-CM

## 2015-09-27 DIAGNOSIS — D72828 Other elevated white blood cell count: Secondary | ICD-10-CM | POA: Diagnosis not present

## 2015-09-27 DIAGNOSIS — Z8673 Personal history of transient ischemic attack (TIA), and cerebral infarction without residual deficits: Secondary | ICD-10-CM | POA: Diagnosis not present

## 2015-09-27 LAB — CBC WITH DIFFERENTIAL/PLATELET
Basophils Absolute: 0.1 10*3/uL (ref 0–0.1)
Basophils Relative: 1 %
EOS ABS: 0 10*3/uL (ref 0–0.7)
EOS PCT: 0 %
HCT: 31.2 % — ABNORMAL LOW (ref 40.0–52.0)
Hemoglobin: 10 g/dL — ABNORMAL LOW (ref 13.0–18.0)
LYMPHS ABS: 2.2 10*3/uL (ref 1.0–3.6)
LYMPHS PCT: 18 %
MCH: 28.6 pg (ref 26.0–34.0)
MCHC: 32.1 g/dL (ref 32.0–36.0)
MCV: 89.1 fL (ref 80.0–100.0)
MONOS PCT: 6 %
Monocytes Absolute: 0.7 10*3/uL (ref 0.2–1.0)
Neutro Abs: 8.9 10*3/uL — ABNORMAL HIGH (ref 1.4–6.5)
Neutrophils Relative %: 75 %
PLATELETS: 383 10*3/uL (ref 150–440)
RBC: 3.5 MIL/uL — AB (ref 4.40–5.90)
RDW: 18.2 % — AB (ref 11.5–14.5)
WBC: 11.9 10*3/uL — AB (ref 3.8–10.6)

## 2015-09-27 LAB — COMPREHENSIVE METABOLIC PANEL
ALBUMIN: 3.7 g/dL (ref 3.5–5.0)
ALT: 20 U/L (ref 17–63)
AST: 16 U/L (ref 15–41)
Alkaline Phosphatase: 154 U/L — ABNORMAL HIGH (ref 38–126)
Anion gap: 8 (ref 5–15)
BILIRUBIN TOTAL: 0.4 mg/dL (ref 0.3–1.2)
BUN: 11 mg/dL (ref 6–20)
CO2: 26 mmol/L (ref 22–32)
CREATININE: 0.7 mg/dL (ref 0.61–1.24)
Calcium: 8.9 mg/dL (ref 8.9–10.3)
Chloride: 100 mmol/L — ABNORMAL LOW (ref 101–111)
GFR calc Af Amer: >60 mL/min (ref >60)
GLUCOSE: 148 mg/dL — AB (ref 65–99)
POTASSIUM: 3.9 mmol/L (ref 3.5–5.1)
Sodium: 134 mmol/L — ABNORMAL LOW (ref 135–145)
TOTAL PROTEIN: 6.6 g/dL (ref 6.5–8.1)

## 2015-09-27 MED ORDER — VINCRISTINE SULFATE CHEMO INJECTION 1 MG/ML
2.0000 mg | Freq: Once | INTRAVENOUS | Status: AC
  Administered 2015-09-27: 2 mg via INTRAVENOUS
  Filled 2015-09-27: qty 2

## 2015-09-27 MED ORDER — SODIUM CHLORIDE 0.9 % IV SOLN
Freq: Once | INTRAVENOUS | Status: AC
Start: 2015-09-27 — End: 2015-09-27
  Administered 2015-09-27: 10:00:00 via INTRAVENOUS
  Filled 2015-09-27: qty 1000

## 2015-09-27 MED ORDER — SODIUM CHLORIDE 0.9 % IJ SOLN
10.0000 mL | INTRAMUSCULAR | Status: DC | PRN
  Filled 2015-09-27: qty 10

## 2015-09-27 MED ORDER — SODIUM CHLORIDE 0.9 % IV SOLN
80.0000 mg/m2 | Freq: Once | INTRAVENOUS | Status: AC
  Administered 2015-09-27: 170 mg via INTRAVENOUS
  Filled 2015-09-27: qty 8.5

## 2015-09-27 MED ORDER — HEPARIN SOD (PORK) LOCK FLUSH 100 UNIT/ML IV SOLN
500.0000 [IU] | Freq: Once | INTRAVENOUS | Status: AC | PRN
  Administered 2015-09-27: 500 [IU]
  Filled 2015-09-27: qty 5

## 2015-09-27 MED ORDER — SODIUM CHLORIDE 0.9 % IV SOLN
Freq: Once | INTRAVENOUS | Status: AC
  Administered 2015-09-27: 11:00:00 via INTRAVENOUS
  Filled 2015-09-27: qty 8

## 2015-09-27 MED ORDER — ALBUTEROL SULFATE (2.5 MG/3ML) 0.083% IN NEBU: 2.5000 mg | INHALATION_SOLUTION | Freq: Four times a day (QID) | RESPIRATORY_TRACT | Status: DC | PRN

## 2015-09-27 MED ORDER — SODIUM CHLORIDE 0.9 % IV SOLN
750.0000 mg/m2 | Freq: Once | INTRAVENOUS | Status: AC
  Administered 2015-09-27: 1620 mg via INTRAVENOUS
  Filled 2015-09-27: qty 81

## 2015-09-27 NOTE — Progress Notes (Signed)
[place]  [phone] [phone]  ID: Erik Shelton OB: [birth year]  MR#: [id number]  [id number]  Patient Care Team: [name], MD as PCP - General (Internal Medicine)  CHIEF COMPLAINT:  Chief Complaint  Patient presents with  . Lymphoma    follow up     INTERVAL HISTORY: Patient returns to clinic today for further evaluation and consideration of cycle 8 of etoposide plus CVP. Patient complains of intermittent coughing spells and wheezing.  He denies fevers or chills. He does not complain of weakness or fatigue today. He has no complaints of pain. He does not complain of nausea today and his appetite is good. He has no chest pain or shortness of breath. He denies any vomiting, constipation, or diarrhea. Patient offers no further specific complaints today.  REVIEW OF SYSTEMS:   Review of Systems  Constitutional: Negative for fever and malaise/fatigue.  Respiratory: Positive for cough, wheezing.   Cardiovascular: Negative.   Gastrointestinal: Negative for nausea and abdominal pain.  Musculoskeletal: Negative.   Neurological: Negative for weakness.  Endo/Heme/Allergies: Does not bruise/bleed easily.    As per HPI. Otherwise, a complete review of systems is negatve.  PAST MEDICAL HISTORY: Past Medical History  Diagnosis Date  . Stroke ([place])   . Seizures ([place])   . Hypercholesteremia   . HTN (hypertension)   . Cancer ([place])     T-Cell Lymphoma  . Seizures ([place])     PAST SURGICAL HISTORY: Past Surgical History  Procedure Laterality Date  . Peripheral vascular catheterization N/A 04/24/2015    Procedure: Glori Luis Cath Insertion;  Surgeon: Algernon Huxley, MD;  Location: [facility];  Service: Cardiovascular;  Laterality: N/A;  . Portacath placement      FAMILY HISTORY: Reviewed and unchanged. No reported history of malignancy or chronic disease.     ADVANCED DIRECTIVES:    HEALTH MAINTENANCE: Social History    Substance Use Topics  . Smoking status: Former Research scientist (life sciences)  . Smokeless tobacco: Not on file     Comment: Quit  in 1990  . Alcohol Use: Yes     Comment: Occassional glass of wine     Colonoscopy:  PAP:  Bone density:  Lipid panel:  No Known Allergies  Current Outpatient Prescriptions  Medication Sig Dispense Refill  . acetaminophen (TYLENOL) 325 MG tablet Take 650 mg by mouth every 6 (six) hours as needed for mild pain, moderate pain, fever or headache.    Marland Kitchen aspirin EC 325 MG tablet Take 325 mg by mouth daily.    . ferrous sulfate (FERROUSUL) 325 (65 FE) MG tablet Take 1 tablet (325 mg total) by mouth daily with breakfast. 90 tablet 0  . lidocaine-prilocaine (EMLA) cream Apply 1 application topically as needed. 30 g 2  . losartan (COZAAR) 50 MG tablet Take 50 mg by mouth daily.    Marland Kitchen omeprazole (PRILOSEC) 40 MG capsule Take 1 capsule (40 mg total) by mouth daily. 30 capsule 2  . pravastatin (PRAVACHOL) 80 MG tablet Take 80 mg by mouth daily.    . predniSONE (DELTASONE) 20 MG tablet Take 4.5 tablets (90mg ) daily on days 1 - 5 of chemotherapy 25 tablet 6  . albuterol (PROVENTIL) (2.5 MG/3ML) 0.083% nebulizer solution Take 3 mLs (2.5 mg total) by nebulization every 6 (six) hours as needed for wheezing or shortness of breath. 75 mL 12  . amoxicillin-clavulanate (AUGMENTIN) 875-125 MG tablet Take 1 tablet by mouth every 12 (twelve) hours. (Patient not taking: Reported on  09/27/2015) 10 tablet 0  . oxyCODONE-acetaminophen (ROXICET) 5-325 MG per tablet Take 1 tablet by mouth every 4 (four) hours as needed for severe pain. (Patient not taking: Reported on 09/27/2015) 60 tablet 0  . polyethylene glycol (MIRALAX / GLYCOLAX) packet Take 17 g by mouth daily as needed for moderate constipation. (Patient not taking: Reported on 09/27/2015) 30 each 0   No current facility-administered medications for this visit.   Facility-Administered Medications Ordered in Other Visits  Medication Dose Route Frequency  Provider Last Rate Last Dose  . etoposide (VEPESID) 170 mg in sodium chloride 0.9 % 500 mL chemo infusion  80 mg/m2 (Treatment Plan Actual) Intravenous Once Lloyd Huger, MD 509 mL/hr at 09/27/15 1211 170 mg at 09/27/15 1211  . heparin lock flush 100 unit/mL  500 Units Intracatheter Once PRN Lloyd Huger, MD      . sodium chloride 0.9 % injection 10 mL  10 mL Intracatheter PRN Lloyd Huger, MD   10 mL at 07/05/15 0900  . sodium chloride 0.9 % injection 10 mL  10 mL Intracatheter PRN Lloyd Huger, MD        OBJECTIVE: Filed Vitals:   09/27/15 0907  BP: 142/86  Pulse: 110  Temp: 98.4 F (36.9 C)     Body mass index is 27.22 kg/(m^2).    ECOG FS:0 - Asymptomatic  General: Well-developed, well-nourished, no acute distress. Eyes: Pink conjunctiva, anicteric sclera. Lungs: Clear to auscultation bilaterally. Heart: Regular rate and rhythm. No rubs, murmurs, or gallops. Abdomen: Soft, nontender, nondistended. No organomegaly noted, normoactive bowel sounds. Musculoskeletal: No edema, cyanosis, or clubbing. Neuro: Alert, answering all questions appropriately. Cranial nerves grossly intact. Skin: No rashes or petechiae noted. Psych: Normal affect. Lymphatics: No palpable lymphadenopathy.   LAB RESULTS:  Lab Results  Component Value Date   NA 134* 09/27/2015   K 3.9 09/27/2015   CL 100* 09/27/2015   CO2 26 09/27/2015   GLUCOSE 148* 09/27/2015   BUN 11 09/27/2015   CREATININE 0.70 09/27/2015   CALCIUM 8.9 09/27/2015   PROT 6.6 09/27/2015   ALBUMIN 3.7 09/27/2015   AST 16 09/27/2015   ALT 20 09/27/2015   ALKPHOS 154* 09/27/2015   BILITOT 0.4 09/27/2015   GFRNONAA >60 09/27/2015   GFRAA >60 09/27/2015    Lab Results  Component Value Date   WBC 11.9* 09/27/2015   NEUTROABS 8.9* 09/27/2015   HGB 10.0* 09/27/2015   HCT 31.2* 09/27/2015   MCV 89.1 09/27/2015   PLT 383 09/27/2015     STUDIES: No results found.  ASSESSMENT: Recurrent stage IV T-cell  lymphoma.  PLAN:    1. T-cell lymphoma: Proceed with cycle 8 of CVP plus etoposide. He has now reached his lifetime dose of Adriamycin and this will be dropped from his regimen. Continue with Onpro Neulasta on day 3 of treatment. Return to clinic in 1 and 2 days for etoposide only and then on February 8th for PET results and consideration of cycle 9. Will reimage with PET two days prior to Feb 8 appt.  2. Leukocytosis: Secondary to OnPro Neulasta as above. 3. Anemia: Secondary to chemotherapy, monitor. 4. Neutropenic fevers: Resolved. 5. Pain: Resolved. 6. Wheezing: Albuterol called to pharmacy today.  Patient expressed understanding and was in agreement with this plan. He also understands that He can call clinic at any time with any questions, concerns, or complaints.   Mayra Reel, NP   09/27/2015 12:20 PM   Patient was seen and evaluated and I  agree with the assessment and plan as above.  Lloyd Huger, MD

## 2015-09-28 ENCOUNTER — Telehealth: Payer: Self-pay | Admitting: *Deleted

## 2015-09-28 ENCOUNTER — Other Ambulatory Visit: Payer: Self-pay

## 2015-09-28 ENCOUNTER — Inpatient Hospital Stay: Payer: Medicare Other

## 2015-09-28 VITALS — BP 113/70 | HR 102 | Temp 97.8°F

## 2015-09-28 DIAGNOSIS — C8448 Peripheral T-cell lymphoma, not classified, lymph nodes of multiple sites: Secondary | ICD-10-CM

## 2015-09-28 MED ORDER — SODIUM CHLORIDE 0.9 % IV SOLN
INTRAVENOUS | Status: DC
  Administered 2015-09-28: 14:00:00 via INTRAVENOUS
  Filled 2015-09-28: qty 1000

## 2015-09-28 MED ORDER — HEPARIN SOD (PORK) LOCK FLUSH 100 UNIT/ML IV SOLN
500.0000 [IU] | Freq: Once | INTRAVENOUS | Status: AC
  Administered 2015-09-28: 500 [IU] via INTRAVENOUS
  Filled 2015-09-28: qty 5

## 2015-09-28 MED ORDER — SODIUM CHLORIDE 0.9 % IV SOLN
Freq: Once | INTRAVENOUS | Status: AC
  Administered 2015-09-28: 14:00:00 via INTRAVENOUS
  Filled 2015-09-28: qty 4

## 2015-09-28 MED ORDER — ALBUTEROL SULFATE 108 (90 BASE) MCG/ACT IN AEPB: 1.0000 | INHALATION_SPRAY | Freq: Four times a day (QID) | RESPIRATORY_TRACT | Status: AC | PRN

## 2015-09-28 MED ORDER — ETOPOSIDE CHEMO INJECTION 1 GM/50ML
80.0000 mg/m2 | Freq: Once | INTRAVENOUS | Status: AC
  Administered 2015-09-28: 170 mg via INTRAVENOUS
  Filled 2015-09-28: qty 8.5

## 2015-09-28 MED ORDER — SODIUM CHLORIDE 0.9 % IJ SOLN
10.0000 mL | Freq: Once | INTRAMUSCULAR | Status: AC
  Administered 2015-09-28: 10 mL via INTRAVENOUS
  Filled 2015-09-28: qty 10

## 2015-09-28 NOTE — Telephone Encounter (Signed)
The correct prescription was for Albuterol inhaler (not neb solution).  New rx called in to pharmacy.

## 2015-09-28 NOTE — Telephone Encounter (Signed)
Got med for neb machine, but does not have a nebulizer. Can you please order a neb machine for him?

## 2015-09-28 NOTE — Telephone Encounter (Signed)
Called pt back and left msg on VM that new rx sent to pharmacy

## 2015-09-29 ENCOUNTER — Inpatient Hospital Stay: Payer: Medicare Other

## 2015-09-29 VITALS — BP 118/69 | HR 96 | Temp 96.1°F | Resp 20

## 2015-09-29 DIAGNOSIS — C8448 Peripheral T-cell lymphoma, not classified, lymph nodes of multiple sites: Secondary | ICD-10-CM | POA: Diagnosis not present

## 2015-09-29 MED ORDER — SODIUM CHLORIDE 0.9 % IV SOLN
INTRAVENOUS | Status: DC
  Administered 2015-09-29: 13:00:00 via INTRAVENOUS
  Filled 2015-09-29: qty 1000

## 2015-09-29 MED ORDER — SODIUM CHLORIDE 0.9 % IJ SOLN
10.0000 mL | Freq: Once | INTRAMUSCULAR | Status: AC
  Administered 2015-09-29: 10 mL via INTRAVENOUS
  Filled 2015-09-29: qty 10

## 2015-09-29 MED ORDER — PEGFILGRASTIM 6 MG/0.6ML ~~LOC~~ PSKT
6.0000 mg | PREFILLED_SYRINGE | Freq: Once | SUBCUTANEOUS | Status: AC
  Administered 2015-09-29: 6 mg via SUBCUTANEOUS
  Filled 2015-09-29: qty 0.6

## 2015-09-29 MED ORDER — SODIUM CHLORIDE 0.9 % IV SOLN
Freq: Once | INTRAVENOUS | Status: AC
  Administered 2015-09-29: 13:00:00 via INTRAVENOUS
  Filled 2015-09-29: qty 4

## 2015-09-29 MED ORDER — HEPARIN SOD (PORK) LOCK FLUSH 100 UNIT/ML IV SOLN
500.0000 [IU] | Freq: Once | INTRAVENOUS | Status: AC
  Administered 2015-09-29: 500 [IU] via INTRAVENOUS
  Filled 2015-09-29: qty 5

## 2015-09-29 MED ORDER — SODIUM CHLORIDE 0.9 % IV SOLN
80.0000 mg/m2 | Freq: Once | INTRAVENOUS | Status: AC
  Administered 2015-09-29: 170 mg via INTRAVENOUS
  Filled 2015-09-29: qty 8.5

## 2015-10-17 ENCOUNTER — Telehealth: Payer: Self-pay | Admitting: *Deleted

## 2015-10-17 NOTE — Telephone Encounter (Signed)
Denies fever, has dry cough, but has sinus congestion. Asking if he can take Nyquil. Suggested he try mucinex, claritin or zyrtec and some robitussin and if not batter in a couple of days to call back

## 2015-10-20 ENCOUNTER — Ambulatory Visit
Admission: RE | Admit: 2015-10-20 | Discharge: 2015-10-20 | Disposition: A | Discharge status: Home / Self Care | Payer: Medicare Other | Source: Ambulatory Visit | Attending: Oncology | Admitting: Oncology

## 2015-10-20 DIAGNOSIS — C8442 Peripheral T-cell lymphoma, not classified, intrathoracic lymph nodes: Secondary | ICD-10-CM | POA: Diagnosis present

## 2015-10-20 DIAGNOSIS — Z0189 Encounter for other specified special examinations: Secondary | ICD-10-CM | POA: Diagnosis present

## 2015-10-20 DIAGNOSIS — J90 Pleural effusion, not elsewhere classified: Secondary | ICD-10-CM | POA: Insufficient documentation

## 2015-10-20 LAB — GLUCOSE, CAPILLARY: GLUCOSE-CAPILLARY: 120 mg/dL — AB (ref 65–99)

## 2015-10-20 MED ORDER — FLUDEOXYGLUCOSE F - 18 (FDG) INJECTION
11.7300 | Freq: Once | INTRAVENOUS | Status: AC | PRN
  Administered 2015-10-20: 11.73 via INTRAVENOUS

## 2015-10-24 ENCOUNTER — Encounter: Payer: Self-pay | Admitting: Medical Oncology

## 2015-10-24 ENCOUNTER — Emergency Department: Payer: Medicare Other

## 2015-10-24 ENCOUNTER — Other Ambulatory Visit: Payer: Self-pay

## 2015-10-24 ENCOUNTER — Telehealth: Payer: Self-pay | Admitting: *Deleted

## 2015-10-24 ENCOUNTER — Emergency Department
Admission: EM | Admit: 2015-10-24 | Discharge: 2015-10-24 | Disposition: A | Discharge status: Home / Self Care | Payer: Medicare Other | Attending: Emergency Medicine | Admitting: Emergency Medicine

## 2015-10-24 DIAGNOSIS — Z7982 Long term (current) use of aspirin: Secondary | ICD-10-CM | POA: Diagnosis not present

## 2015-10-24 DIAGNOSIS — Z79899 Other long term (current) drug therapy: Secondary | ICD-10-CM | POA: Diagnosis not present

## 2015-10-24 DIAGNOSIS — Z87891 Personal history of nicotine dependence: Secondary | ICD-10-CM | POA: Diagnosis not present

## 2015-10-24 DIAGNOSIS — J90 Pleural effusion, not elsewhere classified: Secondary | ICD-10-CM | POA: Diagnosis not present

## 2015-10-24 DIAGNOSIS — R05 Cough: Secondary | ICD-10-CM | POA: Diagnosis present

## 2015-10-24 DIAGNOSIS — R600 Localized edema: Secondary | ICD-10-CM | POA: Insufficient documentation

## 2015-10-24 DIAGNOSIS — R0600 Dyspnea, unspecified: Secondary | ICD-10-CM

## 2015-10-24 DIAGNOSIS — I272 Other secondary pulmonary hypertension: Secondary | ICD-10-CM | POA: Insufficient documentation

## 2015-10-24 DIAGNOSIS — C8442 Peripheral T-cell lymphoma, not classified, intrathoracic lymph nodes: Secondary | ICD-10-CM | POA: Diagnosis not present

## 2015-10-24 LAB — BASIC METABOLIC PANEL
Anion gap: 9 (ref 5–15)
BUN: 10 mg/dL (ref 6–20)
CHLORIDE: 104 mmol/L (ref 101–111)
CO2: 26 mmol/L (ref 22–32)
CREATININE: 0.7 mg/dL (ref 0.61–1.24)
Calcium: 8.9 mg/dL (ref 8.9–10.3)
GFR calc Af Amer: >60 mL/min (ref >60)
GFR calc non Af Amer: >60 mL/min (ref >60)
GLUCOSE: 135 mg/dL — AB (ref 65–99)
POTASSIUM: 3.7 mmol/L (ref 3.5–5.1)
SODIUM: 139 mmol/L (ref 135–145)

## 2015-10-24 LAB — CBC
HEMATOCRIT: 31.3 % — AB (ref 40.0–52.0)
Hemoglobin: 10 g/dL — ABNORMAL LOW (ref 13.0–18.0)
MCH: 27.3 pg (ref 26.0–34.0)
MCHC: 31.9 g/dL — ABNORMAL LOW (ref 32.0–36.0)
MCV: 85.7 fL (ref 80.0–100.0)
Platelets: 430 10*3/uL (ref 150–440)
RBC: 3.66 MIL/uL — ABNORMAL LOW (ref 4.40–5.90)
RDW: 17.8 % — AB (ref 11.5–14.5)
WBC: 11.1 10*3/uL — AB (ref 3.8–10.6)

## 2015-10-24 LAB — LACTIC ACID, PLASMA: Lactic Acid, Venous: 0.8 mmol/L (ref 0.5–2.0)

## 2015-10-24 LAB — TROPONIN I: Troponin I: 0.07 ng/mL — ABNORMAL HIGH (ref <0.031)

## 2015-10-24 MED ORDER — HEPARIN SOD (PORK) LOCK FLUSH 100 UNIT/ML IV SOLN
INTRAVENOUS | Status: AC
  Filled 2015-10-24: qty 5

## 2015-10-24 MED ORDER — IOHEXOL 350 MG/ML SOLN
100.0000 mL | Freq: Once | INTRAVENOUS | Status: AC | PRN
  Administered 2015-10-24: 100 mL via INTRAVENOUS

## 2015-10-24 NOTE — ED Notes (Signed)
Pt reports cough, wheezing and sob for a few days. Pt also reports weakness, pt is currently being treated for lymphoma and last chemo treatment was jan 11. Pt also reports epigastric pain.

## 2015-10-24 NOTE — ED Notes (Signed)
Pt discharged home after verbalizing understanding of discharge instructions; nad noted.  Port d/c heparin flush

## 2015-10-24 NOTE — ED Provider Notes (Signed)
[facility] Emergency Department Provider Note  Time seen: 11:12 AM  I have reviewed the triage vital signs and the nursing notes.   HISTORY  Chief Complaint Wheezing; Cough; and Shortness of Breath    HPI Erik Shelton is a 75 y.o. male with a past medical history of CVA, seizure disorder, hypertension, hyperlipidemia, lymphoma, on chemotherapy, who presents the emergency department with difficulty breathing, wheeze, cough for the past one week. According to the patient and the wife the past one week patient has had increased trouble breathing with wheezing cough especially when attempting to lie flat.States occasional chest discomfort when coughing. Denies any nausea, vomiting, diarrhea, abdominal pain, fever. Does state sputum production at times.   Past Medical History  Diagnosis Date  . Stroke ([address])   . Seizures ([place])   . Hypercholesteremia   . HTN (hypertension)   . Cancer ([place])     T-Cell Lymphoma  . Seizures [facility])     Patient Active Problem List   Diagnosis Date Noted  . Neutropenia ([place]) 08/25/2015  . 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 Sepsis ([place]) 08/04/2015  . Fever and chills 08/04/2015  . Intractable pain 04/14/2015  . 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 Peripheral vascular catheterization N/A 04/24/2015    Procedure: Glori Luis Cath Insertion;  Surgeon: Algernon Huxley, MD;  Location: [facility];  Service: Cardiovascular;  Laterality: N/A;  . Portacath placement      Current Outpatient Rx  Name  Route  Sig  Dispense  Refill  . acetaminophen (TYLENOL) 325 MG tablet   Oral   Take 650 mg by mouth every 6 (six) hours as needed for mild pain, moderate pain, fever or headache.         . Albuterol Sulfate 108 (90 Base) MCG/ACT AEPB    Inhalation   Inhale 1 puff into the lungs every 6 (six) hours as needed.   1 each   12   . amoxicillin-clavulanate (AUGMENTIN) 875-125 MG tablet   Oral   Take 1 tablet by mouth every 12 (twelve) hours. Patient not taking: Reported on 09/27/2015   10 tablet   0   . aspirin EC 325 MG tablet   Oral   Take 325 mg by mouth daily.         . ferrous sulfate (FERROUSUL) 325 (65 FE) MG tablet   Oral   Take 1 tablet (325 mg total) by mouth daily with breakfast.   90 tablet   0   . lidocaine-prilocaine (EMLA) cream   Topical   Apply 1 application topically as needed.   30 g   2   . losartan (COZAAR) 50 MG tablet   Oral   Take 50 mg by mouth daily.         Marland Kitchen omeprazole (PRILOSEC) 40 MG capsule   Oral   Take 1 capsule (40 mg total) by mouth daily.   30 capsule   2   . oxyCODONE-acetaminophen (ROXICET) 5-325 MG per tablet   Oral   Take 1 tablet by mouth every 4 (four) hours as needed for severe pain. Patient not taking: Reported on 09/27/2015   60 tablet   0   . polyethylene glycol (MIRALAX / GLYCOLAX) packet   Oral   Take  17 g by mouth daily as needed for moderate constipation. Patient not taking: Reported on 09/27/2015   30 each   0   . pravastatin (PRAVACHOL) 80 MG tablet   Oral   Take 80 mg by mouth daily.         . predniSONE (DELTASONE) 20 MG tablet      Take 4.5 tablets (90mg ) daily on days 1 - 5 of chemotherapy   25 tablet   6     Allergies Review of patient's allergies indicates no known allergies.  No family history on file.  Social History Social History  Substance Use Topics  . Smoking status: Former Research scientist (life sciences)  . Smokeless tobacco: None     Comment: Quit  in 1990  . Alcohol Use: Yes     Comment: Occassional glass of wine    Review of Systems Constitutional: Negative for fever. Cardiovascular: Occasional chest pain with coughing. Respiratory: Positive for shortness of breath, positive for cough, worse when lying flat. For wheeze per  wife. Gastrointestinal: Negative for abdominal pain Neurological: Negative for headache 10-point ROS otherwise negative.  ____________________________________________   PHYSICAL EXAM:  VITAL SIGNS: ED Triage Vitals  Enc Vitals Group     BP 10/24/15 1019 126/102 mmHg     Pulse Rate 10/24/15 1019 120     Resp 10/24/15 1019 20     Temp 10/24/15 1019 97.6 F (36.4 C)     Temp Source 10/24/15 1019 Oral     SpO2 10/24/15 1019 96 %     Weight 10/24/15 1019 195 lb (88.451 kg)     Height 10/24/15 1019 5\' 11"  (1.803 m)     Head Cir --      Peak Flow --      Pain Score 10/24/15 1020 0     Pain Loc --      Pain Edu? --      Excl. in [place]? --     Constitutional: Alert and oriented. Well appearing and in no distress. Eyes: Normal exam ENT   Head: Normocephalic and atraumatic   Mouth/Throat: Mucous membranes are moist. Cardiovascular: Normal rate, regular rhythm. No murmur Respiratory: Normal respiratory effort without tachypnea nor retractions. Breath sounds are clear at port present to right chest. Gastrointestinal: Soft and nontender. No distention.  Musculoskeletal: Nontender with normal range of motion in all extremities. Mild lower extremity edema. Nontender bilaterally. Right lower extremity edema is greater than left, patient states this is long-standing for many years. No erythema. No calf or popliteal tenderness. Neurologic:  Normal speech and language. No gross focal neurologic deficits Skin:  Skin is warm, dry and intact.  Psychiatric: Mood and affect are normal. Speech and behavior are normal.   ____________________________________________    EKG  EKG reviewed and interpreted by myself shows sinus tachycardia 121 bpm, narrow QRS, normal axis, normal intervals, no ST changes. Overall normal EKG besides the tachycardia.  ____________________________________________    RADIOLOGY  Chest x-ray shows increased right pleural  effusion  ____________________________________________   INITIAL IMPRESSION / ASSESSMENT AND PLAN / ED COURSE  Pertinent labs & imaging results that were available during my care of the patient were reviewed by me and considered in my medical decision making (see chart for details).  A presents with 1 week of worsening shortness of breath and cough, worse when lying flat. Denies fever. We will check labs. Chest x-ray shows a pleural effusion which could very likely be the cause of the patient's shortness of breath. If labs  allow we'll likely proceed with a CT chest given the patient's history of cancer to rule out pulmonary emboli or other causes of dyspnea.  CT scan shows lymph node enlargement with narrowing of the pulmonary arteries due to mass effect. Also there is a pleural effusion seen on x-ray. I believe this likely a combination of the pleural effusion and pulmonary hypertension/mass effect on the pulmonary arteries is causing the patient's shortness of breath. There is no signs of blood clot. I discussed the patient with oncology. They stated the patient could be admitted to the hospital, or possibly discharged if the patient felt well enough as his follow-up appointment is in the morning tomorrow. I discussed these options with the patient, and offered admission to the hospital. The patient strongly wishes to go home so he can go to his follow-up appointment in the morning.  Patient remains mildly tachycardic around 110 bpm. Continues to have a good oxygen saturation 95-99 percent on room air.  ____________________________________________   FINAL CLINICAL IMPRESSION(S) / ED DIAGNOSES  Dyspnea Right pleural effusion Pulmonary hypertension  Harvest Dark, MD 10/24/15 1436

## 2015-10-24 NOTE — Discharge Instructions (Signed)
Please follow-up with your oncologist tomorrow as scheduled. As we discussed please return to the emergency department for any worsening trouble breathing or any chest pain.   Falta de aire (Shortness of Breath) Falta de aire significa que tiene dificultad para respirar. Es necesario que reciba atencin mdica de inmediato. CUIDADOS EN EL HOGAR   No fume.  Evite estar cerca de sustancias qumicas (vapores de pintura, polvo) que puedan dificultar su respiracin.  Descanse todo lo que sea necesario. Retome lentamente sus actividades normales.  Tome solo los medicamentos segn le haya indicado el mdico.  Cumpla con las visitas al mdico segn las indicaciones. SOLICITE AYUDA DE INMEDIATO SI:   La falta de aire empeora.  Tiene mareos, pierde el conocimiento (se desmaya) o tiene tos que no mejora con medicamentos.  Tose y escupe sangre.  Siente dolor al respirar.  Tiene dolor en el pecho, los brazos, los hombros o el vientre (abdomen).  Tiene fiebre.  No puede subir escaleras o realizar ejercicio del modo en que lo haca habitualmente.  No mejora como se esperaba.  Le cuesta hacer las actividades normales, aun si ha descansado lo suficiente.  Tiene problemas con los medicamentos.  Aparece algn sntoma nuevo. ASEGRESE DE QUE:  Comprende estas instrucciones.  Controlar su afeccin.  Recibir ayuda de inmediato si no mejora o si empeora.   Esta informacin no tiene Marine scientist el consejo del mdico. Asegrese de hacerle al mdico cualquier pregunta que tenga.   Document Released: 02/20/2010 Document Revised: 09/07/2013 Elsevier Interactive Patient Education [employer].

## 2015-10-24 NOTE — Telephone Encounter (Signed)
C/o SOB, wheezing so loud she can hear him in the next room. Asking if he should come here or go to ER. Per Dr. Grayland Ormond, he is to go to the ER. Notified Kermit Balo and she said she will take him to Er

## 2015-10-24 NOTE — ED Notes (Signed)
Pt's wife reports that pt has had coughing for a couple of weeks, but wheezing became stronger recently. Pt has productive cough that makes it difficult to lie down because he "chokes."

## 2015-10-25 ENCOUNTER — Inpatient Hospital Stay: Payer: Medicare Other

## 2015-10-25 ENCOUNTER — Other Ambulatory Visit: Payer: Self-pay | Admitting: *Deleted

## 2015-10-25 ENCOUNTER — Inpatient Hospital Stay (HOSPITAL_BASED_OUTPATIENT_CLINIC_OR_DEPARTMENT_OTHER): Payer: Medicare Other | Admitting: Oncology

## 2015-10-25 ENCOUNTER — Inpatient Hospital Stay: Payer: Medicare Other | Attending: Oncology

## 2015-10-25 ENCOUNTER — Ambulatory Visit
Admission: RE | Admit: 2015-10-25 | Discharge: 2015-10-25 | Disposition: A | Discharge status: Home / Self Care | Payer: Medicare Other | Source: Ambulatory Visit | Attending: Radiation Oncology | Admitting: Radiation Oncology

## 2015-10-25 ENCOUNTER — Encounter: Payer: Self-pay | Admitting: Radiation Oncology

## 2015-10-25 VITALS — BP 147/82 | HR 122 | Temp 98.6°F | Resp 18 | Wt 196.9 lb

## 2015-10-25 DIAGNOSIS — C8448 Peripheral T-cell lymphoma, not classified, lymph nodes of multiple sites: Secondary | ICD-10-CM

## 2015-10-25 DIAGNOSIS — R0602 Shortness of breath: Secondary | ICD-10-CM | POA: Insufficient documentation

## 2015-10-25 DIAGNOSIS — Z7952 Long term (current) use of systemic steroids: Secondary | ICD-10-CM

## 2015-10-25 DIAGNOSIS — M7989 Other specified soft tissue disorders: Secondary | ICD-10-CM

## 2015-10-25 DIAGNOSIS — R5383 Other fatigue: Secondary | ICD-10-CM | POA: Insufficient documentation

## 2015-10-25 DIAGNOSIS — D6481 Anemia due to antineoplastic chemotherapy: Secondary | ICD-10-CM | POA: Insufficient documentation

## 2015-10-25 DIAGNOSIS — F419 Anxiety disorder, unspecified: Secondary | ICD-10-CM | POA: Insufficient documentation

## 2015-10-25 DIAGNOSIS — E7800 Pure hypercholesterolemia, unspecified: Secondary | ICD-10-CM | POA: Insufficient documentation

## 2015-10-25 DIAGNOSIS — D72828 Other elevated white blood cell count: Secondary | ICD-10-CM | POA: Diagnosis not present

## 2015-10-25 DIAGNOSIS — Z87891 Personal history of nicotine dependence: Secondary | ICD-10-CM | POA: Insufficient documentation

## 2015-10-25 DIAGNOSIS — R590 Localized enlarged lymph nodes: Secondary | ICD-10-CM

## 2015-10-25 DIAGNOSIS — R600 Localized edema: Secondary | ICD-10-CM

## 2015-10-25 DIAGNOSIS — R110 Nausea: Secondary | ICD-10-CM | POA: Insufficient documentation

## 2015-10-25 DIAGNOSIS — C8442 Peripheral T-cell lymphoma, not classified, intrathoracic lymph nodes: Secondary | ICD-10-CM

## 2015-10-25 DIAGNOSIS — T451X5S Adverse effect of antineoplastic and immunosuppressive drugs, sequela: Secondary | ICD-10-CM | POA: Diagnosis not present

## 2015-10-25 DIAGNOSIS — R630 Anorexia: Secondary | ICD-10-CM | POA: Insufficient documentation

## 2015-10-25 DIAGNOSIS — R000 Tachycardia, unspecified: Secondary | ICD-10-CM | POA: Insufficient documentation

## 2015-10-25 DIAGNOSIS — Z7982 Long term (current) use of aspirin: Secondary | ICD-10-CM | POA: Insufficient documentation

## 2015-10-25 DIAGNOSIS — Z8673 Personal history of transient ischemic attack (TIA), and cerebral infarction without residual deficits: Secondary | ICD-10-CM | POA: Insufficient documentation

## 2015-10-25 DIAGNOSIS — Z510 Encounter for antineoplastic radiation therapy: Secondary | ICD-10-CM | POA: Insufficient documentation

## 2015-10-25 DIAGNOSIS — R062 Wheezing: Secondary | ICD-10-CM | POA: Diagnosis not present

## 2015-10-25 DIAGNOSIS — I10 Essential (primary) hypertension: Secondary | ICD-10-CM | POA: Insufficient documentation

## 2015-10-25 DIAGNOSIS — R05 Cough: Secondary | ICD-10-CM

## 2015-10-25 LAB — CBC WITH DIFFERENTIAL/PLATELET
BASOS PCT: 0 %
Basophils Absolute: 0 10*3/uL (ref 0–0.1)
EOS ABS: 0.2 10*3/uL (ref 0–0.7)
EOS PCT: 2 %
HCT: 31.6 % — ABNORMAL LOW (ref 40.0–52.0)
Hemoglobin: 10.3 g/dL — ABNORMAL LOW (ref 13.0–18.0)
LYMPHS ABS: 2.1 10*3/uL (ref 1.0–3.6)
Lymphocytes Relative: 17 %
MCH: 27.8 pg (ref 26.0–34.0)
MCHC: 32.7 g/dL (ref 32.0–36.0)
MCV: 85 fL (ref 80.0–100.0)
MONOS PCT: 9 %
Monocytes Absolute: 1.1 10*3/uL — ABNORMAL HIGH (ref 0.2–1.0)
NEUTROS ABS: 8.6 10*3/uL — AB (ref 1.4–6.5)
NEUTROS PCT: 72 %
PLATELETS: 429 10*3/uL (ref 150–440)
RBC: 3.71 MIL/uL — ABNORMAL LOW (ref 4.40–5.90)
RDW: 17.4 % — ABNORMAL HIGH (ref 11.5–14.5)
WBC: 12.1 10*3/uL — AB (ref 3.8–10.6)

## 2015-10-25 LAB — COMPREHENSIVE METABOLIC PANEL
ALT: 15 U/L — ABNORMAL LOW (ref 17–63)
ANION GAP: 8 (ref 5–15)
AST: 13 U/L — ABNORMAL LOW (ref 15–41)
Albumin: 3.5 g/dL (ref 3.5–5.0)
Alkaline Phosphatase: 163 U/L — ABNORMAL HIGH (ref 38–126)
BUN: 11 mg/dL (ref 6–20)
CHLORIDE: 100 mmol/L — AB (ref 101–111)
CO2: 27 mmol/L (ref 22–32)
CREATININE: 0.69 mg/dL (ref 0.61–1.24)
Calcium: 8.7 mg/dL — ABNORMAL LOW (ref 8.9–10.3)
Glucose, Bld: 149 mg/dL — ABNORMAL HIGH (ref 65–99)
Potassium: 3.7 mmol/L (ref 3.5–5.1)
SODIUM: 135 mmol/L (ref 135–145)
Total Bilirubin: 0.6 mg/dL (ref 0.3–1.2)
Total Protein: 6.3 g/dL — ABNORMAL LOW (ref 6.5–8.1)

## 2015-10-25 MED ORDER — SODIUM CHLORIDE 0.9% FLUSH
10.0000 mL | INTRAVENOUS | Status: DC | PRN
  Administered 2015-10-25: 10 mL via INTRAVENOUS
  Filled 2015-10-25: qty 10

## 2015-10-25 MED ORDER — OXYCODONE-ACETAMINOPHEN 5-325 MG PO TABS: 1.0000 | ORAL_TABLET | ORAL | Status: DC | PRN

## 2015-10-25 MED ORDER — HEPARIN SOD (PORK) LOCK FLUSH 100 UNIT/ML IV SOLN
500.0000 [IU] | Freq: Once | INTRAVENOUS | Status: AC
  Administered 2015-10-25: 500 [IU] via INTRAVENOUS
  Filled 2015-10-25: qty 5

## 2015-10-25 MED ORDER — PREDNISONE 5 MG (21) PO TBPK: 5.0000 mg | ORAL_TABLET | Freq: Every day | ORAL | Status: DC

## 2015-10-25 NOTE — Progress Notes (Signed)
Patient went to ER yesterday with difficulty breathing and wheezing.  He has epigastric pain after eating and feels bloated.

## 2015-10-25 NOTE — Consult Note (Signed)
Except an outstanding is perfect of Radiation Oncology NEW PATIENT EVALUATION  Name: Erik Shelton  MRN: [id number]  Date:   10/25/2015     DOB: Aug 05, 1941   This 75 y.o. 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  REFERRING PHYSICIAN: [name], MD  CHIEF COMPLAINT: No chief complaint on file.   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   PREVIOUS INVESTIGATIONS:  PET CT scan and CT scans reviewed Clinical notes reviewed Pathology reports reviewed  HPI: Patient is a 75 year old male with known peripheral T-cell lymphoma stage IV who is been treated with multiple chemotherapy regimens most recently withCHOPE. He's had several hospitalizations for fever most notably yesterday was seen in the emergency room with increasing shortness of breath dyspnea on exertion and wheezing. CT scan show progressive lymphadenopathy in the mediastinum causing compression of primary vasculature as well as the mainstem takeoffs of his trachea. He's also having some dysphasia. Patient was seen by medical oncology today started on steroids and has canceled his chemotherapy for today. I been asked to evaluate the patient for possible palliative radiation therapy to his chest. 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 That was delivered back in 2014.  PLANNED TREATMENT REGIMEN: Palliative radiation therapy to mediastinum  PAST MEDICAL HISTORY:  has a past medical history of Stroke ([place]); Seizures ([place]); Hypercholesteremia; HTN (hypertension); Cancer ([place]); and Seizures ([place]).    PAST SURGICAL HISTORY:  Past Surgical History  Procedure Laterality Date  . Peripheral vascular  catheterization N/A 04/24/2015    Procedure: Glori Luis Cath Insertion;  Surgeon: Algernon Huxley, MD;  Location: [facility];  Service: Cardiovascular;  Laterality: N/A;  . Portacath placement      FAMILY HISTORY: family history is not on file.  SOCIAL HISTORY:  reports that he has quit smoking. He does not have any smokeless tobacco history on file. He reports that he drinks alcohol. He reports that he does not use illicit drugs.  ALLERGIES: Review of patient's allergies indicates no known allergies.  MEDICATIONS:  Current Outpatient Prescriptions  Medication Sig Dispense Refill  . acetaminophen (TYLENOL) 325 MG tablet Take 650 mg by mouth every 6 (six) hours as needed for mild pain, moderate pain, fever or headache.    . Albuterol Sulfate 108 (90 Base) MCG/ACT AEPB Inhale 1 puff into the lungs every 6 (six) hours as needed. 1 each 12  . aspirin EC 325 MG tablet Take 325 mg by mouth daily.    . ferrous sulfate (FERROUSUL) 325 (65 FE) MG tablet Take 1 tablet (325 mg total) by mouth daily with breakfast. 90 tablet 0  . lidocaine-prilocaine (EMLA) cream Apply 1 application topically as needed. 30 g 2  . losartan (COZAAR) 50 MG tablet Take 50 mg by mouth daily.    Marland Kitchen omeprazole (PRILOSEC) 40 MG capsule Take 1 capsule (40 mg total) by mouth daily. 30 capsule 2  . oxyCODONE-acetaminophen (ROXICET) 5-325 MG tablet Take 1 tablet by mouth every 4 (four) hours as needed for severe pain. 60 tablet 0  . polyethylene glycol (MIRALAX / GLYCOLAX) packet Take 17 g by mouth daily as needed for moderate constipation. 30 each 0  . pravastatin (PRAVACHOL) 80 MG tablet Take 80 mg by mouth daily.    Marland Kitchen  predniSONE (DELTASONE) 20 MG tablet Take 4.5 tablets (70m) daily on days 1 - 5 of chemotherapy 25 tablet 6  . predniSONE (STERAPRED UNI-PAK 21 TAB) 5 MG (21) TBPK tablet Take 1 tablet (5 mg total) by mouth daily. 21 tablet 0   No current facility-administered medications for this encounter.    Facility-Administered Medications Ordered in Other Encounters  Medication Dose Route Frequency Provider Last Rate Last Dose  . sodium chloride 0.9 % injection 10 mL  10 mL Intracatheter PRN TLloyd Huger MD   10 mL at 07/05/15 0900  . sodium chloride flush (NS) 0.9 % injection 10 mL  10 mL Intravenous PRN TLloyd Huger MD   10 mL at 10/25/15 0919    ECOG PERFORMANCE STATUS:  1 - Symptomatic but completely ambulatory  REVIEW OF SYSTEMS: Except for shortness breath dyspnea on exertion and dysphasia Patient denies any weight loss, fatigue, weakness, fever, chills or night sweats. Patient denies any loss of vision, blurred vision. Patient denies any ringing  of the ears or hearing loss. No irregular heartbeat. Patient denies heart murmur or history of fainting. Patient denies any chest pain or pain radiating to her upper extremities. Patient denies any shortness of breath, difficulty breathing at night, cough or hemoptysis. Patient denies any swelling in the lower legs. Patient denies any nausea vomiting, vomiting of blood, or coffee ground material in the vomitus. Patient denies any stomach pain. Patient states has had normal bowel movements no significant constipation or diarrhea. Patient denies any dysuria, hematuria or significant nocturia. Patient denies any problems walking, swelling in the joints or loss of balance. Patient denies any skin changes, loss of hair or loss of weight. Patient denies any excessive worrying or anxiety or significant depression. Patient denies any problems with insomnia. Patient denies excessive thirst, polyuria, polydipsia. Patient denies any swollen glands, patient denies easy bruising or easy bleeding. Patient denies any recent infections, allergies or URI. Patient "s visual fields have not changed significantly in recent time.    PHYSICAL EXAM: There were no vitals taken for this visit. A well-developed male in NAD no evidence of significant dyspnea on  exertion today. No cervical or supra clavicular adenopathy is appreciated lungs are clear to A&P. Well-developed well-nourished patient in NAD. HEENT reveals PERLA, EOMI, discs not visualized.  Oral cavity is clear. No oral mucosal lesions are identified. Neck is clear without evidence of cervical or supraclavicular adenopathy. Lungs are clear to A&P. Cardiac examination is essentially unremarkable with regular rate and rhythm without murmur rub or thrill. Abdomen is benign with no organomegaly or masses noted. Motor sensory and DTR levels are equal and symmetric in the upper and lower extremities. Cranial nerves II through XII are grossly intact. Proprioception is intact. No peripheral adenopathy or edema is identified. No motor or sensory levels are noted. Crude visual fields are within normal range.  LABORATORY DATA: Pathology reports reviewed    RADIOLOGY RESULTS: PET scan and CT scans reviewed compatible with the above-stated findings   IMPRESSION: Progressive mediastinal adenopathy causing pulmonary symptoms as described above in patient with known peripheral T-cell lymphoma  PLAN: At this time I recommended palliative radiation therapy to his chest trying to incorporate the areas of bulky adenopathy. Would plan on delivering 3000 cGy in 15 fractions to his chest. I've asked for to have nasal oxygen prescribed and he will remain on steroids until he begins his treatments. I have set up and ordered CT simulation on the patient. Risks and benefits of  radiation were reviewed with the patient and his family. They including possible dysphasia dysphasia from radiation esophagitis, skin reaction, fatigue, increased cough, alteration of blood counts and loss of normal lung volume. Patient and his family compromise treatment plan well.  I would like to take this opportunity for allowing me to participate in the care of your patient.Armstead Peaks., MD

## 2015-10-26 ENCOUNTER — Inpatient Hospital Stay: Payer: Medicare Other

## 2015-10-26 ENCOUNTER — Ambulatory Visit
Admission: RE | Admit: 2015-10-26 | Discharge: 2015-10-26 | Disposition: A | Discharge status: Home / Self Care | Payer: Medicare Other | Source: Ambulatory Visit | Attending: Oncology | Admitting: Oncology

## 2015-10-26 DIAGNOSIS — R590 Localized enlarged lymph nodes: Secondary | ICD-10-CM | POA: Diagnosis not present

## 2015-10-26 DIAGNOSIS — M7989 Other specified soft tissue disorders: Secondary | ICD-10-CM | POA: Insufficient documentation

## 2015-10-27 ENCOUNTER — Inpatient Hospital Stay: Payer: Medicare Other

## 2015-10-29 ENCOUNTER — Other Ambulatory Visit: Payer: Self-pay | Admitting: Oncology

## 2015-10-29 NOTE — Progress Notes (Signed)
[facility]  [phone] [phone]  ID: Erik Shelton OB: 1940/11/04  MR#: [id number]  [id number]  Patient Care Team: [name], MD as PCP - General (Internal Medicine)  CHIEF COMPLAINT:  Chief Complaint  Patient presents with  . Lymphoma    INTERVAL HISTORY: Patient returns to clinic today for further evaluation, discussion of his imaging results, and treatment planning. He was evaluated in the emergency room yesterday with difficulty breathing and increased wheezing. He also was complaining of epigastric pain. His symptoms have improved today.   He has increased swelling in his right lower extremity. He otherwise feels well. He has increased anxiety today.  He denies fevers or chills. He does not complain of weakness or fatigue today. He does not complain of nausea today and his appetite is fair.  He denies any vomiting, constipation, or diarrhea. Patient offers no further specific complaints today.  REVIEW OF SYSTEMS:   Review of Systems  Constitutional: Negative for fever and malaise/fatigue.  Respiratory: Positive for cough, wheezing.   Cardiovascular: Negative.   Gastrointestinal: Negative for nausea and abdominal pain.  Musculoskeletal: Negative.   Neurological: Negative for weakness.  Endo/Heme/Allergies: Does not bruise/bleed easily.    As per HPI. Otherwise, a complete review of systems is negatve.  PAST MEDICAL HISTORY: Past Medical History  Diagnosis Date  . Stroke ([place])   . Seizures ([place])   . Hypercholesteremia   . HTN (hypertension)   . Cancer ([place])     T-Cell Lymphoma  . Seizures ([place])     PAST SURGICAL HISTORY: Past Surgical History  Procedure Laterality Date  . Peripheral vascular catheterization N/A 04/24/2015    Procedure: Glori Luis Cath Insertion;  Surgeon: Algernon Huxley, MD;  Location: [facility];  Service: Cardiovascular;  Laterality: N/A;  . Portacath placement      FAMILY HISTORY:  Reviewed and unchanged. No reported history of malignancy or chronic disease.     ADVANCED DIRECTIVES:    HEALTH MAINTENANCE: Social History  Substance Use Topics  . Smoking status: Former Research scientist (life sciences)  . Smokeless tobacco: Not on file     Comment: Quit  in 1990  . Alcohol Use: Yes     Comment: Occassional glass of wine     Colonoscopy:  PAP:  Bone density:  Lipid panel:  No Known Allergies  Current Outpatient Prescriptions  Medication Sig Dispense Refill  . acetaminophen (TYLENOL) 325 MG tablet Take 650 mg by mouth every 6 (six) hours as needed for mild pain, moderate pain, fever or headache.    . Albuterol Sulfate 108 (90 Base) MCG/ACT AEPB Inhale 1 puff into the lungs every 6 (six) hours as needed. 1 each 12  . aspirin EC 325 MG tablet Take 325 mg by mouth daily.    . ferrous sulfate (FERROUSUL) 325 (65 FE) MG tablet Take 1 tablet (325 mg total) by mouth daily with breakfast. 90 tablet 0  . lidocaine-prilocaine (EMLA) cream Apply 1 application topically as needed. 30 g 2  . losartan (COZAAR) 50 MG tablet Take 50 mg by mouth daily.    Marland Kitchen omeprazole (PRILOSEC) 40 MG capsule Take 1 capsule (40 mg total) by mouth daily. 30 capsule 2  . oxyCODONE-acetaminophen (ROXICET) 5-325 MG tablet Take 1 tablet by mouth every 4 (four) hours as needed for severe pain. 60 tablet 0  . polyethylene glycol (MIRALAX / GLYCOLAX) packet Take 17 g by mouth daily as needed for moderate constipation. 30 each 0  . pravastatin (PRAVACHOL)  80 MG tablet Take 80 mg by mouth daily.    . predniSONE (DELTASONE) 20 MG tablet Take 4.5 tablets (90mg ) daily on days 1 - 5 of chemotherapy 25 tablet 6  . predniSONE (STERAPRED UNI-PAK 21 TAB) 5 MG (21) TBPK tablet Take 1 tablet (5 mg total) by mouth daily. 21 tablet 0   No current facility-administered medications for this visit.    OBJECTIVE: Filed Vitals:   10/25/15 1040 10/25/15 1211  BP: 147/82 147/82  Pulse: 122 122  Temp: 98.6 F (37 C) 98.6 F (37 C)  Resp:  18 18     Body mass index is 27.47 kg/(m^2).    ECOG FS:0 - Asymptomatic  General: Well-developed, well-nourished, no acute distress. Eyes: Pink conjunctiva, anicteric sclera. Lungs: Clear to auscultation bilaterally. Heart: Regular rate and rhythm. No rubs, murmurs, or gallops. Abdomen: Soft, nontender, nondistended. No organomegaly noted, normoactive bowel sounds. Musculoskeletal: No edema, cyanosis, or clubbing. Neuro: Alert, answering all questions appropriately. Cranial nerves grossly intact. Skin: No rashes or petechiae noted. Psych: Normal affect. Lymphatics: No palpable lymphadenopathy.   LAB RESULTS:  Lab Results  Component Value Date   NA 135 10/25/2015   K 3.7 10/25/2015   CL 100* 10/25/2015   CO2 27 10/25/2015   GLUCOSE 149* 10/25/2015   BUN 11 10/25/2015   CREATININE 0.69 10/25/2015   CALCIUM 8.7* 10/25/2015   PROT 6.3* 10/25/2015   ALBUMIN 3.5 10/25/2015   AST 13* 10/25/2015   ALT 15* 10/25/2015   ALKPHOS 163* 10/25/2015   BILITOT 0.6 10/25/2015   GFRNONAA >60 10/25/2015   GFRAA >60 10/25/2015    Lab Results  Component Value Date   WBC 12.1* 10/25/2015   NEUTROABS 8.6* 10/25/2015   HGB 10.3* 10/25/2015   HCT 31.6* 10/25/2015   MCV 85.0 10/25/2015   PLT 429 10/25/2015     STUDIES: Dg Chest 2 View  10/24/2015  CLINICAL DATA:  Cough, wheezing and shortness of breath for a few days, weakness, lymphoma post chemotherapy most recently 09/27/2015, epigastric pain, hypertension, prior stroke, former smoker EXAM: CHEST  2 VIEW COMPARISON:  08/24/2015 ; correlation PET-CT 10/20/2015 FINDINGS: RIGHT jugular Port-A-Cath with tip projecting over SVC. Enlargement of cardiac silhouette. Enlargement of both hila compatible with adenopathy is seen on PET-CT. Pulmonary vascularity normal. Increased RIGHT pleural effusion and basilar atelectasis since prior study. Mild LEFT basilar atelectasis as well. Eventration LEFT diaphragm again seen. Upper lungs clear. No  pneumothorax. Bones unremarkable. IMPRESSION: Enlargement of cardiac silhouette. BILATERAL hilar adenopathy, RIGHT pleural effusion and bibasilar atelectasis RIGHT greater than LEFT increased since 09/03/2015. Electronically Signed   By: Lavonia Dana M.D.   On: 10/24/2015 10:55   Ct Angio Chest Pe W/cm &/or Wo Cm  10/24/2015  CLINICAL DATA:  75 year old male with cough wheezing and shortness of Breath. Weakness. Lymphoma, recent chemotherapy in January. Initial encounter. EXAM: CT ANGIOGRAPHY CHEST WITH CONTRAST TECHNIQUE: Multidetector CT imaging of the chest was performed using the standard protocol during bolus administration of intravenous contrast. Multiplanar CT image reconstructions and MIPs were obtained to evaluate the vascular anatomy. CONTRAST:  182mL OMNIPAQUE IOHEXOL 350 MG/ML SOLN COMPARISON:  10/24/2015 chest radiographs. PET-CT 10/20/2015, and earlier FINDINGS: Good contrast bolus timing in the pulmonary arterial tree. No focal filling defect identified in the pulmonary arterial tree to suggest the presence of acute pulmonary embolism.However, there is mass effect on the bilateral pulmonary arteries due to progressive hilar soft tissue mass bilaterally. Bulky and confluent mediastinal and hilar lymphadenopathy has markedly increased since  July 2016, but not significantly changed from the recent PET-CT 4 days ago. There is associated superior diaphragmatic and retrocrural lymphadenopathy. Small layering pleural effusions greater on the right. No pericardial effusion. Negative visualized aorta. Axillary lymphadenopathy is stable from the recent PET-CT. No significant thoracic inlet lymphadenopathy identified. Stable visualized upper abdominal viscera and porta hepatis lymphadenopathy. Central airways remain patent. There is significant bilateral hilar airway mass effect similar to that on the pulmonary arteries. Dependent mild pulmonary opacity bilaterally. No pulmonary consolidation. No acute osseous  abnormality identified. Review of the MIP images confirms the above findings. IMPRESSION: 1.  No evidence of acute pulmonary embolus. 2. However, significant mass effect and narrowing of both the bilateral hilar pulmonary arteries and hilar airways from mediastinal and hilar adenopathy/tumor. Appearance not significantly changed from PET-CT 4 days ago. 3. Small right greater than left layering pleural effusions with atelectasis. Electronically Signed   By: Genevie Ann M.D.   On: 10/24/2015 13:47   Nm Pet Image Restag (ps) Skull Base To Thigh  10/20/2015  CLINICAL DATA:  Subsequent Treatment strategy for T-cell lymphoma. EXAM: NUCLEAR MEDICINE PET SKULL BASE TO THIGH TECHNIQUE: 11.7 mCi F-18 FDG was injected intravenously. Full-ring PET imaging was performed from the skull base to thigh after the radiotracer. CT data was obtained and used for attenuation correction and anatomic localization. FASTING BLOOD GLUCOSE:  Value: 120 mg/dl COMPARISON:  07/24/2015. FINDINGS: NECK No hypermetabolic lymph nodes in the neck. CHEST Index left axillary lymph node measures 1.6 cm short axis today ( SUV max = 2.8 today compared to 2.0 previously) compared to 1.3 cm previously. Index right axillary lymph node is 1.3 cm short axis today compared to 1.5 cm previously. SUV max = 3.5 today compared to 3.8 previously. Index AP window lymph node measured previously at 12 mm short axis is stable at 12 mm and remains hypermetabolic without significant change. The index right paratracheal lymph node measured previously at 1.2 cm short axis and SUV max = 3.9 has progressed in the interval, now measuring 2.1 cm short axis and SUV max = 4.9. Right hilar lymphadenopathy is progressed although discrete lymph nodes are difficult to measure given the lack of intravenous contrast material. Uptake of the right hilar disease shows SUV max = 7.8 today compared to 4.4 previously. Coronary artery calcification is evident. There is a new small right pleural  effusion with associated new tiny left pleural effusion. ABDOMEN/PELVIS No abnormal hypermetabolic activity within the liver, pancreas, adrenal glands, or spleen. There are cysts evident in the kidneys bilaterally. Diverticular changes are noted in the left colon. Small bilateral inguinal hernias contain only fat. Index gastrohepatic lymph node measured previously at 9 mm short axis is now 14 mm. 2 cm short axis hepatoduodenal ligament lymph node with SUV max = 4.8 today was only 1.1 cm short axis previously. The aortocaval lymph node described on the previous study at 7 mm short axis is stable at 7 mm today. Hypermetabolic lymph nodes are seen in both external iliac and inguinal regions. Index right inguinal lymph node measured previously at 1.8 cm short axis is now 1.9 cm the index left inguinal lymph node measured previously 1.1 cm is now 1.3 cm. SKELETON No focal hypermetabolic activity to suggest skeletal metastasis although diffuse marrow uptake is again evident. IMPRESSION: Some of the previously described index lymph nodes identified today in the mediastinum, right hilum, gastrohepatic ligaments, and hepatoduodenal ligament show interval increase in size and hypermetabolism, features suggesting disease progression. The axillary, pelvic  sidewall, and inguinal lymph index nodes have remained relatively stable in size and hypermetabolism. Overall, imaging features suggest a mixed response. New small right pleural effusion with new tiny left pleural effusion. Electronically Signed   By: Misty Stanley M.D.   On: 10/20/2015 13:12   US Venous Img Lower Unilateral Right  10/26/2015  CLINICAL DATA:  Right lower extremity swelling. EXAM: RIGHT LOWER EXTREMITY VENOUS DOPPLER ULTRASOUND TECHNIQUE: Gray-scale sonography with graded compression, as well as color Doppler and duplex ultrasound were performed to evaluate the lower extremity deep venous systems from the level of the common femoral vein and including the  common femoral, femoral, profunda femoral, popliteal and calf veins including the posterior tibial, peroneal and gastrocnemius veins when visible. The superficial great saphenous vein was also interrogated. Spectral Doppler was utilized to evaluate flow at rest and with distal augmentation maneuvers in the common femoral, femoral and popliteal veins. COMPARISON:  PET-CT 11/09/2015. FINDINGS: Contralateral Common Femoral Vein: Respiratory phasicity is normal and symmetric with the symptomatic side. No evidence of thrombus. Normal compressibility. Common Femoral Vein: No evidence of thrombus. Normal compressibility, respiratory phasicity and response to augmentation. Saphenofemoral Junction: No evidence of thrombus. Normal compressibility and flow on color Doppler imaging. Profunda Femoral Vein: No evidence of thrombus. Normal compressibility and flow on color Doppler imaging. Femoral Vein: No evidence of thrombus. Normal compressibility, respiratory phasicity and response to augmentation. Popliteal Vein: No evidence of thrombus. Normal compressibility, respiratory phasicity and response to augmentation. Calf Veins: No evidence of thrombus. Normal compressibility and flow on color Doppler imaging. Superficial Great Saphenous Vein: No evidence of thrombus. Normal compressibility and flow on color Doppler imaging. Other Findings:  1.2 x 2.6 x 4.5 cm lymph node right groin. IMPRESSION: No evidence of deep venous thrombosis. Right inguinal lymphadenopathy. Electronically Signed   By: Marcello Moores  Register   On: 10/26/2015 11:41   ONCOLOGIC TREATMENT HISTORY: Patient received 3 cycles of CHOP between January and March 2014 achieving a complete remission. Upon recurrence, patient received 4 cycle CHOP plus etoposide between August and October 2016. Patient received his lifetime dose of Adriamycin which was dropped from his regimen and he received 4 additional cycles of etoposide plus CVP from November 2016 through February  2017.  ASSESSMENT: Recurrent stage IV T-cell lymphoma.  PLAN:    1. T-cell lymphoma:  PET scan and CT scan results reviewed independently with evidence of progression of disease. After lengthy discussion with the patient, he wishes to proceed with salvage chemotherapy using gemcitabine and oxaliplatin. Will continue with Onpro Neulasta as well.  Return to clinic on November 15, 2015 to initiate cycle 1. 2. Leukocytosis: Secondary to OnPro Neulasta as above. 3. Anemia: Secondary to chemotherapy, monitor. 4. Neutropenic fevers: Resolved. 5. Pain: Resolved. 6. Wheezing /shortness of breath: Continue albuterol, possibly secondary to progression of disease. Patient had consultation with radiation oncology for XRT for symptomatic treatment.  Approximately 30 minutes was spent in discussion of which greater than 50% was consultation.  Patient expressed understanding and was in agreement with this plan. He also understands that He can call clinic at any time with any questions, concerns, or complaints.   Lloyd Huger, MD   10/29/2015 8:44 AM

## 2015-10-30 ENCOUNTER — Ambulatory Visit
Admission: RE | Admit: 2015-10-30 | Discharge: 2015-10-30 | Disposition: A | Discharge status: Home / Self Care | Payer: Medicare Other | Source: Ambulatory Visit | Attending: Radiation Oncology | Admitting: Radiation Oncology

## 2015-10-30 ENCOUNTER — Other Ambulatory Visit: Payer: Self-pay | Admitting: *Deleted

## 2015-10-30 DIAGNOSIS — C8448 Peripheral T-cell lymphoma, not classified, lymph nodes of multiple sites: Secondary | ICD-10-CM | POA: Diagnosis present

## 2015-10-30 DIAGNOSIS — R630 Anorexia: Secondary | ICD-10-CM | POA: Diagnosis not present

## 2015-10-30 DIAGNOSIS — Z510 Encounter for antineoplastic radiation therapy: Secondary | ICD-10-CM | POA: Diagnosis not present

## 2015-10-30 DIAGNOSIS — R000 Tachycardia, unspecified: Secondary | ICD-10-CM | POA: Diagnosis not present

## 2015-10-30 DIAGNOSIS — R5383 Other fatigue: Secondary | ICD-10-CM | POA: Diagnosis not present

## 2015-10-30 DIAGNOSIS — R110 Nausea: Secondary | ICD-10-CM | POA: Diagnosis not present

## 2015-10-30 MED ORDER — DEXAMETHASONE 4 MG PO TABS: 4.0000 mg | ORAL_TABLET | Freq: Two times a day (BID) | ORAL | Status: DC

## 2015-10-31 DIAGNOSIS — C8448 Peripheral T-cell lymphoma, not classified, lymph nodes of multiple sites: Secondary | ICD-10-CM | POA: Diagnosis not present

## 2015-11-01 ENCOUNTER — Other Ambulatory Visit: Payer: Self-pay

## 2015-11-01 ENCOUNTER — Ambulatory Visit
Admission: RE | Admit: 2015-11-01 | Discharge: 2015-11-01 | Disposition: A | Discharge status: Home / Self Care | Payer: Medicare Other | Source: Ambulatory Visit | Attending: Radiation Oncology | Admitting: Radiation Oncology

## 2015-11-01 DIAGNOSIS — C8448 Peripheral T-cell lymphoma, not classified, lymph nodes of multiple sites: Secondary | ICD-10-CM | POA: Diagnosis not present

## 2015-11-02 ENCOUNTER — Ambulatory Visit
Admission: RE | Admit: 2015-11-02 | Discharge: 2015-11-02 | Disposition: A | Discharge status: Home / Self Care | Payer: Medicare Other | Source: Ambulatory Visit | Attending: Radiation Oncology | Admitting: Radiation Oncology

## 2015-11-02 ENCOUNTER — Inpatient Hospital Stay: Payer: Medicare Other | Attending: Internal Medicine

## 2015-11-02 DIAGNOSIS — C8448 Peripheral T-cell lymphoma, not classified, lymph nodes of multiple sites: Secondary | ICD-10-CM | POA: Diagnosis not present

## 2015-11-03 ENCOUNTER — Inpatient Hospital Stay: Payer: Medicare Other

## 2015-11-03 ENCOUNTER — Ambulatory Visit
Admission: RE | Admit: 2015-11-03 | Discharge: 2015-11-03 | Disposition: A | Discharge status: Home / Self Care | Payer: Medicare Other | Source: Ambulatory Visit | Attending: Radiation Oncology | Admitting: Radiation Oncology

## 2015-11-03 DIAGNOSIS — C8448 Peripheral T-cell lymphoma, not classified, lymph nodes of multiple sites: Secondary | ICD-10-CM | POA: Diagnosis not present

## 2015-11-06 ENCOUNTER — Inpatient Hospital Stay: Payer: Medicare Other

## 2015-11-06 ENCOUNTER — Ambulatory Visit
Admission: RE | Admit: 2015-11-06 | Discharge: 2015-11-06 | Disposition: A | Discharge status: Home / Self Care | Payer: Medicare Other | Source: Ambulatory Visit | Attending: Radiation Oncology | Admitting: Radiation Oncology

## 2015-11-06 DIAGNOSIS — C8448 Peripheral T-cell lymphoma, not classified, lymph nodes of multiple sites: Secondary | ICD-10-CM | POA: Diagnosis not present

## 2015-11-07 ENCOUNTER — Inpatient Hospital Stay: Payer: Medicare Other

## 2015-11-07 ENCOUNTER — Ambulatory Visit
Admission: RE | Admit: 2015-11-07 | Discharge: 2015-11-07 | Disposition: A | Discharge status: Home / Self Care | Payer: Medicare Other | Source: Ambulatory Visit | Attending: Radiation Oncology | Admitting: Radiation Oncology

## 2015-11-07 DIAGNOSIS — C8448 Peripheral T-cell lymphoma, not classified, lymph nodes of multiple sites: Secondary | ICD-10-CM | POA: Diagnosis not present

## 2015-11-07 LAB — SURGICAL PATHOLOGY

## 2015-11-08 ENCOUNTER — Inpatient Hospital Stay: Payer: Medicare Other

## 2015-11-08 ENCOUNTER — Ambulatory Visit
Admission: RE | Admit: 2015-11-08 | Discharge: 2015-11-08 | Disposition: A | Discharge status: Home / Self Care | Payer: Medicare Other | Source: Ambulatory Visit | Attending: Radiation Oncology | Admitting: Radiation Oncology

## 2015-11-08 DIAGNOSIS — C8448 Peripheral T-cell lymphoma, not classified, lymph nodes of multiple sites: Secondary | ICD-10-CM | POA: Diagnosis not present

## 2015-11-09 ENCOUNTER — Inpatient Hospital Stay: Payer: Medicare Other

## 2015-11-09 ENCOUNTER — Ambulatory Visit
Admission: RE | Admit: 2015-11-09 | Discharge: 2015-11-09 | Disposition: A | Discharge status: Home / Self Care | Payer: Medicare Other | Source: Ambulatory Visit | Attending: Radiation Oncology | Admitting: Radiation Oncology

## 2015-11-09 DIAGNOSIS — C8448 Peripheral T-cell lymphoma, not classified, lymph nodes of multiple sites: Secondary | ICD-10-CM | POA: Diagnosis not present

## 2015-11-10 ENCOUNTER — Ambulatory Visit
Admission: RE | Admit: 2015-11-10 | Discharge: 2015-11-10 | Disposition: A | Discharge status: Home / Self Care | Payer: Medicare Other | Source: Ambulatory Visit | Attending: Radiation Oncology | Admitting: Radiation Oncology

## 2015-11-10 ENCOUNTER — Inpatient Hospital Stay: Payer: Medicare Other

## 2015-11-10 DIAGNOSIS — C8448 Peripheral T-cell lymphoma, not classified, lymph nodes of multiple sites: Secondary | ICD-10-CM | POA: Diagnosis not present

## 2015-11-13 ENCOUNTER — Ambulatory Visit
Admission: RE | Admit: 2015-11-13 | Discharge: 2015-11-13 | Disposition: A | Discharge status: Home / Self Care | Payer: Medicare Other | Source: Ambulatory Visit | Attending: Radiation Oncology | Admitting: Radiation Oncology

## 2015-11-13 ENCOUNTER — Inpatient Hospital Stay: Payer: Medicare Other

## 2015-11-13 ENCOUNTER — Other Ambulatory Visit: Payer: Self-pay | Admitting: Oncology

## 2015-11-13 DIAGNOSIS — C8448 Peripheral T-cell lymphoma, not classified, lymph nodes of multiple sites: Secondary | ICD-10-CM | POA: Diagnosis not present

## 2015-11-14 ENCOUNTER — Other Ambulatory Visit: Payer: Self-pay | Admitting: Oncology

## 2015-11-14 ENCOUNTER — Inpatient Hospital Stay: Payer: Medicare Other

## 2015-11-14 ENCOUNTER — Ambulatory Visit
Admission: RE | Admit: 2015-11-14 | Discharge: 2015-11-14 | Disposition: A | Discharge status: Home / Self Care | Payer: Medicare Other | Source: Ambulatory Visit | Attending: Radiation Oncology | Admitting: Radiation Oncology

## 2015-11-14 DIAGNOSIS — C8448 Peripheral T-cell lymphoma, not classified, lymph nodes of multiple sites: Secondary | ICD-10-CM | POA: Diagnosis not present

## 2015-11-15 ENCOUNTER — Inpatient Hospital Stay: Payer: Medicare Other | Attending: Oncology

## 2015-11-15 ENCOUNTER — Inpatient Hospital Stay: Payer: Medicare Other

## 2015-11-15 ENCOUNTER — Other Ambulatory Visit: Payer: Self-pay | Admitting: *Deleted

## 2015-11-15 ENCOUNTER — Ambulatory Visit
Admission: RE | Admit: 2015-11-15 | Discharge: 2015-11-15 | Disposition: A | Discharge status: Home / Self Care | Payer: Medicare Other | Source: Ambulatory Visit | Attending: Radiation Oncology | Admitting: Radiation Oncology

## 2015-11-15 ENCOUNTER — Inpatient Hospital Stay (HOSPITAL_BASED_OUTPATIENT_CLINIC_OR_DEPARTMENT_OTHER): Payer: Medicare Other | Admitting: Oncology

## 2015-11-15 VITALS — BP 154/93 | HR 86 | Temp 98.7°F | Resp 18 | Wt 190.7 lb

## 2015-11-15 DIAGNOSIS — Z87891 Personal history of nicotine dependence: Secondary | ICD-10-CM | POA: Insufficient documentation

## 2015-11-15 DIAGNOSIS — C8448 Peripheral T-cell lymphoma, not classified, lymph nodes of multiple sites: Secondary | ICD-10-CM | POA: Diagnosis not present

## 2015-11-15 DIAGNOSIS — E860 Dehydration: Secondary | ICD-10-CM | POA: Insufficient documentation

## 2015-11-15 DIAGNOSIS — A044 Other intestinal Escherichia coli infections: Secondary | ICD-10-CM | POA: Diagnosis not present

## 2015-11-15 DIAGNOSIS — R7982 Elevated C-reactive protein (CRP): Secondary | ICD-10-CM

## 2015-11-15 DIAGNOSIS — R600 Localized edema: Secondary | ICD-10-CM | POA: Insufficient documentation

## 2015-11-15 DIAGNOSIS — E7800 Pure hypercholesterolemia, unspecified: Secondary | ICD-10-CM | POA: Diagnosis not present

## 2015-11-15 DIAGNOSIS — C8442 Peripheral T-cell lymphoma, not classified, intrathoracic lymph nodes: Secondary | ICD-10-CM

## 2015-11-15 DIAGNOSIS — I10 Essential (primary) hypertension: Secondary | ICD-10-CM

## 2015-11-15 DIAGNOSIS — E876 Hypokalemia: Secondary | ICD-10-CM | POA: Insufficient documentation

## 2015-11-15 DIAGNOSIS — Z7689 Persons encountering health services in other specified circumstances: Secondary | ICD-10-CM | POA: Insufficient documentation

## 2015-11-15 DIAGNOSIS — G620 Drug-induced polyneuropathy: Secondary | ICD-10-CM | POA: Diagnosis not present

## 2015-11-15 DIAGNOSIS — R634 Abnormal weight loss: Secondary | ICD-10-CM | POA: Insufficient documentation

## 2015-11-15 DIAGNOSIS — R531 Weakness: Secondary | ICD-10-CM

## 2015-11-15 DIAGNOSIS — R140 Abdominal distension (gaseous): Secondary | ICD-10-CM

## 2015-11-15 DIAGNOSIS — R439 Unspecified disturbances of smell and taste: Secondary | ICD-10-CM | POA: Insufficient documentation

## 2015-11-15 DIAGNOSIS — R110 Nausea: Secondary | ICD-10-CM | POA: Insufficient documentation

## 2015-11-15 DIAGNOSIS — R0602 Shortness of breath: Secondary | ICD-10-CM | POA: Insufficient documentation

## 2015-11-15 DIAGNOSIS — T451X5S Adverse effect of antineoplastic and immunosuppressive drugs, sequela: Secondary | ICD-10-CM | POA: Diagnosis not present

## 2015-11-15 DIAGNOSIS — D72828 Other elevated white blood cell count: Secondary | ICD-10-CM

## 2015-11-15 DIAGNOSIS — Z79899 Other long term (current) drug therapy: Secondary | ICD-10-CM | POA: Diagnosis not present

## 2015-11-15 DIAGNOSIS — F329 Major depressive disorder, single episode, unspecified: Secondary | ICD-10-CM | POA: Insufficient documentation

## 2015-11-15 DIAGNOSIS — R062 Wheezing: Secondary | ICD-10-CM | POA: Diagnosis not present

## 2015-11-15 DIAGNOSIS — D696 Thrombocytopenia, unspecified: Secondary | ICD-10-CM | POA: Diagnosis not present

## 2015-11-15 DIAGNOSIS — R202 Paresthesia of skin: Secondary | ICD-10-CM | POA: Diagnosis not present

## 2015-11-15 DIAGNOSIS — R109 Unspecified abdominal pain: Secondary | ICD-10-CM | POA: Insufficient documentation

## 2015-11-15 DIAGNOSIS — Z5111 Encounter for antineoplastic chemotherapy: Secondary | ICD-10-CM | POA: Insufficient documentation

## 2015-11-15 DIAGNOSIS — Z8673 Personal history of transient ischemic attack (TIA), and cerebral infarction without residual deficits: Secondary | ICD-10-CM | POA: Insufficient documentation

## 2015-11-15 DIAGNOSIS — K4020 Bilateral inguinal hernia, without obstruction or gangrene, not specified as recurrent: Secondary | ICD-10-CM | POA: Diagnosis not present

## 2015-11-15 DIAGNOSIS — Z7982 Long term (current) use of aspirin: Secondary | ICD-10-CM | POA: Diagnosis not present

## 2015-11-15 DIAGNOSIS — E871 Hypo-osmolality and hyponatremia: Secondary | ICD-10-CM | POA: Diagnosis not present

## 2015-11-15 DIAGNOSIS — K5790 Diverticulosis of intestine, part unspecified, without perforation or abscess without bleeding: Secondary | ICD-10-CM | POA: Diagnosis not present

## 2015-11-15 DIAGNOSIS — D6481 Anemia due to antineoplastic chemotherapy: Secondary | ICD-10-CM | POA: Diagnosis not present

## 2015-11-15 LAB — COMPREHENSIVE METABOLIC PANEL
ALBUMIN: 3.8 g/dL (ref 3.5–5.0)
ALT: 57 U/L (ref 17–63)
AST: 16 U/L (ref 15–41)
Alkaline Phosphatase: 92 U/L (ref 38–126)
Anion gap: 6 (ref 5–15)
BUN: 31 mg/dL — AB (ref 6–20)
CHLORIDE: 96 mmol/L — AB (ref 101–111)
CO2: 26 mmol/L (ref 22–32)
CREATININE: 0.66 mg/dL (ref 0.61–1.24)
Calcium: 8.7 mg/dL — ABNORMAL LOW (ref 8.9–10.3)
GFR calc non Af Amer: >60 mL/min (ref >60)
Glucose, Bld: 262 mg/dL — ABNORMAL HIGH (ref 65–99)
Potassium: 4.2 mmol/L (ref 3.5–5.1)
SODIUM: 128 mmol/L — AB (ref 135–145)
Total Bilirubin: 0.4 mg/dL (ref 0.3–1.2)
Total Protein: 6.2 g/dL — ABNORMAL LOW (ref 6.5–8.1)

## 2015-11-15 LAB — CBC WITH DIFFERENTIAL/PLATELET
BASOS ABS: 0 10*3/uL (ref 0–0.1)
BASOS PCT: 0 %
EOS ABS: 0 10*3/uL (ref 0–0.7)
EOS PCT: 0 %
HCT: 36.5 % — ABNORMAL LOW (ref 40.0–52.0)
Hemoglobin: 11.6 g/dL — ABNORMAL LOW (ref 13.0–18.0)
Lymphocytes Relative: 5 %
Lymphs Abs: 0.6 10*3/uL — ABNORMAL LOW (ref 1.0–3.6)
MCH: 26.6 pg (ref 26.0–34.0)
MCHC: 31.7 g/dL — ABNORMAL LOW (ref 32.0–36.0)
MCV: 83.9 fL (ref 80.0–100.0)
Monocytes Absolute: 0.8 10*3/uL (ref 0.2–1.0)
Monocytes Relative: 6 %
Neutro Abs: 11.7 10*3/uL — ABNORMAL HIGH (ref 1.4–6.5)
Neutrophils Relative %: 89 %
PLATELETS: 291 10*3/uL (ref 150–440)
RBC: 4.36 MIL/uL — AB (ref 4.40–5.90)
RDW: 19.2 % — ABNORMAL HIGH (ref 11.5–14.5)
WBC: 13.2 10*3/uL — AB (ref 3.8–10.6)

## 2015-11-15 MED ORDER — HEPARIN SOD (PORK) LOCK FLUSH 100 UNIT/ML IV SOLN
500.0000 [IU] | Freq: Once | INTRAVENOUS | Status: AC | PRN
  Administered 2015-11-15: 500 [IU]
  Filled 2015-11-15: qty 5

## 2015-11-15 MED ORDER — SODIUM CHLORIDE 0.9% FLUSH
10.0000 mL | INTRAVENOUS | Status: DC | PRN
  Filled 2015-11-15: qty 10

## 2015-11-15 MED ORDER — DEXAMETHASONE 4 MG PO TABS: 4.0000 mg | ORAL_TABLET | Freq: Two times a day (BID) | ORAL | Status: DC

## 2015-11-15 MED ORDER — SODIUM CHLORIDE 0.9 % IV SOLN
10.0000 mg | Freq: Once | INTRAVENOUS | Status: AC
  Administered 2015-11-15: 10 mg via INTRAVENOUS
  Filled 2015-11-15: qty 1

## 2015-11-15 MED ORDER — PALONOSETRON HCL INJECTION 0.25 MG/5ML
0.2500 mg | Freq: Once | INTRAVENOUS | Status: AC
  Administered 2015-11-15: 0.25 mg via INTRAVENOUS
  Filled 2015-11-15: qty 5

## 2015-11-15 MED ORDER — PEGFILGRASTIM 6 MG/0.6ML ~~LOC~~ PSKT
6.0000 mg | PREFILLED_SYRINGE | Freq: Once | SUBCUTANEOUS | Status: AC
  Administered 2015-11-15: 6 mg via SUBCUTANEOUS
  Filled 2015-11-15: qty 0.6

## 2015-11-15 MED ORDER — OXALIPLATIN CHEMO INJECTION 100 MG/20ML
200.0000 mg | Freq: Once | INTRAVENOUS | Status: AC
  Administered 2015-11-15: 200 mg via INTRAVENOUS
  Filled 2015-11-15: qty 40

## 2015-11-15 MED ORDER — DEXTROSE 5 % IV SOLN
Freq: Once | INTRAVENOUS | Status: AC
  Administered 2015-11-15: 11:00:00 via INTRAVENOUS
  Filled 2015-11-15: qty 1000

## 2015-11-15 MED ORDER — GEMCITABINE HCL CHEMO INJECTION 1 GM/26.3ML
1000.0000 mg/m2 | Freq: Once | INTRAVENOUS | Status: AC
  Administered 2015-11-15: 2128 mg via INTRAVENOUS
  Filled 2015-11-15: qty 50.71

## 2015-11-15 NOTE — Progress Notes (Signed)
Patient feels better since starting radiation and his wheezing has improved.

## 2015-11-16 ENCOUNTER — Ambulatory Visit
Admission: RE | Admit: 2015-11-16 | Discharge: 2015-11-16 | Disposition: A | Discharge status: Home / Self Care | Payer: Medicare Other | Source: Ambulatory Visit | Attending: Radiation Oncology | Admitting: Radiation Oncology

## 2015-11-16 ENCOUNTER — Inpatient Hospital Stay: Payer: Medicare Other

## 2015-11-16 DIAGNOSIS — C8448 Peripheral T-cell lymphoma, not classified, lymph nodes of multiple sites: Secondary | ICD-10-CM | POA: Diagnosis not present

## 2015-11-17 ENCOUNTER — Ambulatory Visit
Admission: RE | Admit: 2015-11-17 | Discharge: 2015-11-17 | Disposition: A | Discharge status: Home / Self Care | Payer: Medicare Other | Source: Ambulatory Visit | Attending: Radiation Oncology | Admitting: Radiation Oncology

## 2015-11-17 ENCOUNTER — Inpatient Hospital Stay: Payer: Medicare Other

## 2015-11-17 DIAGNOSIS — C8448 Peripheral T-cell lymphoma, not classified, lymph nodes of multiple sites: Secondary | ICD-10-CM | POA: Diagnosis not present

## 2015-11-20 ENCOUNTER — Ambulatory Visit
Admission: RE | Admit: 2015-11-20 | Discharge: 2015-11-20 | Disposition: A | Discharge status: Home / Self Care | Payer: Medicare Other | Source: Ambulatory Visit | Attending: Radiation Oncology | Admitting: Radiation Oncology

## 2015-11-20 ENCOUNTER — Other Ambulatory Visit: Payer: Self-pay | Admitting: *Deleted

## 2015-11-20 ENCOUNTER — Inpatient Hospital Stay: Payer: Medicare Other

## 2015-11-20 DIAGNOSIS — C8448 Peripheral T-cell lymphoma, not classified, lymph nodes of multiple sites: Secondary | ICD-10-CM | POA: Diagnosis not present

## 2015-11-20 MED ORDER — DEXAMETHASONE 4 MG PO TABS: 4.0000 mg | ORAL_TABLET | Freq: Two times a day (BID) | ORAL | Status: DC

## 2015-11-21 ENCOUNTER — Inpatient Hospital Stay: Payer: Medicare Other

## 2015-11-21 ENCOUNTER — Ambulatory Visit
Admission: RE | Admit: 2015-11-21 | Discharge: 2015-11-21 | Disposition: A | Discharge status: Home / Self Care | Payer: Medicare Other | Source: Ambulatory Visit | Attending: Radiation Oncology | Admitting: Radiation Oncology

## 2015-11-21 ENCOUNTER — Other Ambulatory Visit: Payer: Self-pay | Admitting: *Deleted

## 2015-11-21 DIAGNOSIS — C8441 Peripheral T-cell lymphoma, not classified, lymph nodes of head, face, and neck: Secondary | ICD-10-CM

## 2015-11-21 DIAGNOSIS — C8448 Peripheral T-cell lymphoma, not classified, lymph nodes of multiple sites: Secondary | ICD-10-CM | POA: Diagnosis not present

## 2015-11-21 MED ORDER — HEPARIN SOD (PORK) LOCK FLUSH 100 UNIT/ML IV SOLN
500.0000 [IU] | Freq: Once | INTRAVENOUS | Status: AC
  Administered 2015-11-21: 500 [IU] via INTRAVENOUS

## 2015-11-21 MED ORDER — SODIUM CHLORIDE 0.9 % IV SOLN
Freq: Once | INTRAVENOUS | Status: AC
  Administered 2015-11-21: 11:00:00 via INTRAVENOUS
  Filled 2015-11-21: qty 1000

## 2015-11-22 ENCOUNTER — Emergency Department: Payer: Medicare Other

## 2015-11-22 ENCOUNTER — Inpatient Hospital Stay: Payer: Medicare Other

## 2015-11-22 ENCOUNTER — Other Ambulatory Visit: Payer: Self-pay

## 2015-11-22 ENCOUNTER — Ambulatory Visit: Payer: Medicare Other

## 2015-11-22 ENCOUNTER — Emergency Department
Admission: EM | Admit: 2015-11-22 | Discharge: 2015-11-22 | Disposition: A | Discharge status: Home / Self Care | Payer: Medicare Other | Attending: Emergency Medicine | Admitting: Emergency Medicine

## 2015-11-22 ENCOUNTER — Inpatient Hospital Stay: Payer: Medicare Other | Admitting: Oncology

## 2015-11-22 ENCOUNTER — Encounter: Payer: Self-pay | Admitting: Emergency Medicine

## 2015-11-22 DIAGNOSIS — I10 Essential (primary) hypertension: Secondary | ICD-10-CM | POA: Diagnosis not present

## 2015-11-22 DIAGNOSIS — R109 Unspecified abdominal pain: Secondary | ICD-10-CM | POA: Diagnosis not present

## 2015-11-22 DIAGNOSIS — Z79899 Other long term (current) drug therapy: Secondary | ICD-10-CM | POA: Insufficient documentation

## 2015-11-22 DIAGNOSIS — Z7982 Long term (current) use of aspirin: Secondary | ICD-10-CM | POA: Diagnosis not present

## 2015-11-22 DIAGNOSIS — Z87891 Personal history of nicotine dependence: Secondary | ICD-10-CM | POA: Diagnosis not present

## 2015-11-22 DIAGNOSIS — R110 Nausea: Secondary | ICD-10-CM | POA: Insufficient documentation

## 2015-11-22 DIAGNOSIS — R197 Diarrhea, unspecified: Secondary | ICD-10-CM

## 2015-11-22 LAB — GASTROINTESTINAL PANEL BY PCR, STOOL (REPLACES STOOL CULTURE)
Adenovirus F40/41: NOT DETECTED
Astrovirus: NOT DETECTED
CRYPTOSPORIDIUM: NOT DETECTED
Campylobacter species: NOT DETECTED
Cyclospora cayetanensis: NOT DETECTED
E. COLI O157: NOT DETECTED
ENTAMOEBA HISTOLYTICA: NOT DETECTED
ENTEROAGGREGATIVE E COLI (EAEC): DETECTED — AB
Enteropathogenic E coli (EPEC): NOT DETECTED
Enterotoxigenic E coli (ETEC): NOT DETECTED
GIARDIA LAMBLIA: NOT DETECTED
NOROVIRUS GI/GII: NOT DETECTED
PLESIMONAS SHIGELLOIDES: NOT DETECTED
Rotavirus A: NOT DETECTED
SALMONELLA SPECIES: DETECTED — AB
SAPOVIRUS (I, II, IV, AND V): NOT DETECTED
SHIGELLA/ENTEROINVASIVE E COLI (EIEC): NOT DETECTED
Shiga like toxin producing E coli (STEC): NOT DETECTED
VIBRIO CHOLERAE: NOT DETECTED
Vibrio species: NOT DETECTED
YERSINIA ENTEROCOLITICA: DETECTED — AB

## 2015-11-22 LAB — CBC WITH DIFFERENTIAL/PLATELET
BASOS ABS: 0 10*3/uL (ref 0–0.1)
Basophils Relative: 0 %
Eosinophils Absolute: 0.1 10*3/uL (ref 0–0.7)
Eosinophils Relative: 1 %
HEMATOCRIT: 38.4 % — AB (ref 40.0–52.0)
Hemoglobin: 12.2 g/dL — ABNORMAL LOW (ref 13.0–18.0)
LYMPHS ABS: 0.4 10*3/uL — AB (ref 1.0–3.6)
Lymphocytes Relative: 4 %
MCH: 26.3 pg (ref 26.0–34.0)
MCHC: 31.9 g/dL — AB (ref 32.0–36.0)
MCV: 82.5 fL (ref 80.0–100.0)
MONO ABS: 0.4 10*3/uL (ref 0.2–1.0)
MONOS PCT: 4 %
NEUTROS ABS: 7.9 10*3/uL — AB (ref 1.4–6.5)
Neutrophils Relative %: 91 %
PLATELETS: 92 10*3/uL — AB (ref 150–440)
RBC: 4.65 MIL/uL (ref 4.40–5.90)
RDW: 19.3 % — AB (ref 11.5–14.5)
WBC: 8.8 10*3/uL (ref 3.8–10.6)

## 2015-11-22 LAB — URINALYSIS COMPLETE WITH MICROSCOPIC (ARMC ONLY)
BILIRUBIN URINE: NEGATIVE
Bacteria, UA: NONE SEEN
GLUCOSE, UA: NEGATIVE mg/dL
Hgb urine dipstick: NEGATIVE
KETONES UR: NEGATIVE mg/dL
Leukocytes, UA: NEGATIVE
Nitrite: NEGATIVE
PROTEIN: NEGATIVE mg/dL
SPECIFIC GRAVITY, URINE: 1.038 — AB (ref 1.005–1.030)
SQUAMOUS EPITHELIAL / LPF: NONE SEEN
pH: 5 (ref 5.0–8.0)

## 2015-11-22 LAB — LIPASE, BLOOD: LIPASE: 29 U/L (ref 11–51)

## 2015-11-22 LAB — COMPREHENSIVE METABOLIC PANEL
ALBUMIN: 3 g/dL — AB (ref 3.5–5.0)
ALK PHOS: 94 U/L (ref 38–126)
ALT: 28 U/L (ref 17–63)
AST: 15 U/L (ref 15–41)
Anion gap: 5 (ref 5–15)
BILIRUBIN TOTAL: 0.7 mg/dL (ref 0.3–1.2)
BUN: 25 mg/dL — AB (ref 6–20)
CO2: 26 mmol/L (ref 22–32)
CREATININE: 0.67 mg/dL (ref 0.61–1.24)
Calcium: 8.3 mg/dL — ABNORMAL LOW (ref 8.9–10.3)
Chloride: 101 mmol/L (ref 101–111)
GFR calc Af Amer: >60 mL/min (ref >60)
GLUCOSE: 105 mg/dL — AB (ref 65–99)
POTASSIUM: 4.3 mmol/L (ref 3.5–5.1)
Sodium: 132 mmol/L — ABNORMAL LOW (ref 135–145)
TOTAL PROTEIN: 5.1 g/dL — AB (ref 6.5–8.1)

## 2015-11-22 MED ORDER — METRONIDAZOLE 500 MG PO TABS
500.0000 mg | ORAL_TABLET | Freq: Once | ORAL | Status: AC
  Administered 2015-11-22: 500 mg via ORAL
  Filled 2015-11-22: qty 1

## 2015-11-22 MED ORDER — BISMUTH SUBSALICYLATE 262 MG/15ML PO SUSP
30.0000 mL | Freq: Three times a day (TID) | ORAL | Status: DC
  Administered 2015-11-22: 30 mL via ORAL
  Filled 2015-11-22: qty 118

## 2015-11-22 MED ORDER — SODIUM CHLORIDE 0.9 % IV SOLN
Freq: Once | INTRAVENOUS | Status: AC
  Administered 2015-11-22: 999 mL via INTRAVENOUS

## 2015-11-22 MED ORDER — BISMUTH SUBSALICYLATE 262 MG/15ML PO SUSP
30.0000 mL | Freq: Once | ORAL | Status: DC
  Filled 2015-11-22: qty 118

## 2015-11-22 MED ORDER — METRONIDAZOLE 500 MG PO TABS: 500.0000 mg | ORAL_TABLET | Freq: Three times a day (TID) | ORAL | Status: DC

## 2015-11-22 MED ORDER — CIPROFLOXACIN HCL 500 MG PO TABS
500.0000 mg | ORAL_TABLET | Freq: Once | ORAL | Status: AC
  Administered 2015-11-22: 500 mg via ORAL
  Filled 2015-11-22: qty 1

## 2015-11-22 MED ORDER — HEPARIN SOD (PORK) LOCK FLUSH 100 UNIT/ML IV SOLN
500.0000 [IU] | Freq: Once | INTRAVENOUS | Status: AC
  Administered 2015-11-22: 500 [IU] via INTRAVENOUS
  Filled 2015-11-22: qty 5

## 2015-11-22 MED ORDER — IOHEXOL 240 MG/ML SOLN
25.0000 mL | Freq: Once | INTRAMUSCULAR | Status: AC | PRN
  Administered 2015-11-22: 25 mL via ORAL

## 2015-11-22 MED ORDER — ONDANSETRON HCL 4 MG/2ML IJ SOLN
4.0000 mg | Freq: Once | INTRAMUSCULAR | Status: AC
  Administered 2015-11-22: 4 mg via INTRAVENOUS
  Filled 2015-11-22: qty 2

## 2015-11-22 MED ORDER — MORPHINE SULFATE (PF) 4 MG/ML IV SOLN
4.0000 mg | Freq: Once | INTRAVENOUS | Status: AC
  Administered 2015-11-22: 4 mg via INTRAVENOUS
  Filled 2015-11-22: qty 1

## 2015-11-22 MED ORDER — IOHEXOL 300 MG/ML  SOLN
100.0000 mL | Freq: Once | INTRAMUSCULAR | Status: AC | PRN
  Administered 2015-11-22: 100 mL via INTRAVENOUS

## 2015-11-22 MED ORDER — CIPROFLOXACIN HCL 500 MG PO TABS
500.0000 mg | ORAL_TABLET | Freq: Two times a day (BID) | ORAL | Status: AC
Start: 2015-11-22 — End: 2015-12-02

## 2015-11-22 NOTE — Discharge Instructions (Signed)
Dolor abdominal en adultos (Abdominal Pain, Adult) El dolor puede tener muchas causas. Normalmente la causa del dolor abdominal no es una enfermedad y Teacher, English as a foreign language sin Clinical research associate. Frecuentemente puede controlarse y tratarse en casa. Su mdico le Chartered certified accountant examen fsico y posiblemente solicite anlisis de sangre y radiografas para ayudar a Teacher, adult education la gravedad de su dolor. Sin embargo, en [employer], debe transcurrir ms tiempo antes de que se pueda Pension scheme manager una causa evidente del dolor. Antes de llegar a ese punto, es posible que su mdico no sepa si necesita ms pruebas o un tratamiento ms profundo. INSTRUCCIONES PARA EL CUIDADO EN EL HOGAR  Est atento al dolor para ver si hay cambios. Las siguientes indicaciones ayudarn a Chief Strategy Officer que pueda sentir:  [place] solo medicamentos de venta libre o recetados, segn las indicaciones del mdico.  No tome laxantes a menos que se lo haya indicado su mdico.  Pruebe con Ardelia Mems dieta lquida absoluta (caldo, t o agua) segn se lo indique su mdico. Introduzca gradualmente una dieta normal, segn su tolerancia. SOLICITE ATENCIN MDICA SI:  Tiene dolor abdominal sin explicacin.  Tiene dolor abdominal relacionado con nuseas o diarrea.  Tiene dolor cuando orina o defeca.  Experimenta dolor abdominal que lo despierta de noche.  Tiene dolor abdominal que empeora o mejora cuando come alimentos.  Tiene dolor abdominal que empeora cuando come alimentos grasosos.  Tiene fiebre. SOLICITE ATENCIN MDICA DE INMEDIATO SI:   El dolor no desaparece en un plazo mximo de 2horas.  No deja de (vomitar).  El Social research officer, government se siente solo en partes del abdomen, como el lado derecho o la parte inferior izquierda del abdomen.  Evaca materia fecal sanguinolenta o negra, de aspecto alquitranado. ASEGRESE DE QUE:  Comprende estas instrucciones.  Controlar su afeccin.  Recibir ayuda de inmediato si no mejora o si empeora.   Esta  informacin no tiene Marine scientist el consejo del mdico. Asegrese de hacerle al mdico cualquier pregunta que tenga.   Document Released: 09/02/2005 Document Revised: 09/23/2014 Elsevier Interactive Patient Education [employer].   There is a little bit of inflammation in one spot in the colon which we did not see before. I will give you some Cipro 1 twice a day with plenty of fluids and Flagyl 13 times a day both of these antibiotics. He can use the Percocet as needed for pain. Please return for worse pain fever diarrhea or feeling sicker. Please follow-up with Dr. Lisabeth Pick in later on this week or early next week.

## 2015-11-22 NOTE — ED Provider Notes (Signed)
[facility] Emergency Department Provider Note  ____________________________________________  Time seen: Approximately 10:11 AM  I have reviewed the triage vital signs and the nursing notes.   HISTORY  Chief Complaint Diarrhea and Nausea    HPI Erik Shelton is a 75 y.o. male who is getting a new treatment for his lymphoma. He is developed crampy abdominal pains not eating very much and began having nausea and diarrhea yesterday. The abdominal pain is crampy fairly severe in nature and diffuse. Nothing seems to make it better or worse. The pain seemed to come on a day or so after the new treatment in the nausea and diarrhea as I said yesterday.   Past Medical History  Diagnosis Date  . Stroke ([place])   . Seizures ([place])   . Hypercholesteremia   . HTN (hypertension)   . Cancer ([place])     T-Cell Lymphoma  . Seizures [facility])     Patient Active Problem List   Diagnosis Date Noted  . Neutropenia ([place]) 08/25/2015  . 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 Sepsis ([place]) 08/04/2015  . Fever and chills 08/04/2015  . Intractable pain 04/14/2015  . 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 Peripheral vascular catheterization N/A 04/24/2015    Procedure: Glori Luis Cath Insertion;  Surgeon: Algernon Huxley, MD;  Location: [place] [facility];  Service: Cardiovascular;  Laterality: N/A;  . Portacath placement      Current Outpatient Rx  Name  Route  Sig  Dispense  Refill  . acetaminophen (TYLENOL) 325 MG tablet   Oral   Take 650 mg by mouth every 6 (six) hours as needed for mild pain, moderate pain, fever or headache.         . Albuterol Sulfate 108 (90 Base) MCG/ACT AEPB   Inhalation   Inhale 1 puff into the lungs every 6 (six) hours as needed.   1 each   12   . aspirin EC 325  MG tablet   Oral   Take 325 mg by mouth daily.         Marland Kitchen dexamethasone (DECADRON) 4 MG tablet   Oral   Take 1 tablet (4 mg total) by mouth 2 (two) times daily with a meal. Until 11/22/15.  Then take 1 tablet daily x7 days, then 1 tablet every other day until finished   20 tablet   0   . lidocaine-prilocaine (EMLA) cream   Topical   Apply 1 application topically as needed.   30 g   2   . losartan (COZAAR) 50 MG tablet   Oral   Take 50 mg by mouth daily.         Marland Kitchen omeprazole (PRILOSEC) 40 MG capsule   Oral   Take 1 capsule (40 mg total) by mouth daily.   30 capsule   2   . oxyCODONE-acetaminophen (ROXICET) 5-325 MG tablet   Oral   Take 1 tablet by mouth every 4 (four) hours as needed for severe pain.   60 tablet   0   . polyethylene glycol (MIRALAX / GLYCOLAX) packet   Oral   Take 17 g by mouth daily as needed for moderate constipation.   30 each   0   . pravastatin (PRAVACHOL) 80 MG tablet   Oral  Take 80 mg by mouth daily.           Allergies Review of patient's allergies indicates no known allergies.  No family history on file.  Social History Social History  Substance Use Topics  . Smoking status: Former Research scientist (life sciences)  . Smokeless tobacco: None     Comment: Quit  in 1990  . Alcohol Use: Yes     Comment: Occassional glass of wine    Review of Systems Constitutional: No fever/chills Eyes: No visual changes. ENT: No sore throat. Cardiovascular: Denies chest pain. Respiratory: Denies shortness of breath. Gastrointestinal:See history of present illness Genitourinary: Negative for dysuria. Musculoskeletal: Negative for back pain. Skin: Negative for rash. Neurological: Negative for headaches, focal weakness or numbness.  10-point ROS otherwise negative.  ____________________________________________   PHYSICAL EXAM:  VITAL SIGNS: ED Triage Vitals  Enc Vitals Group     BP 11/22/15 0834 107/76 mmHg     Pulse Rate 11/22/15 0834 122     Resp  11/22/15 0834 18     Temp 11/22/15 0834 97.4 F (36.3 C)     Temp Source 11/22/15 0834 Oral     SpO2 11/22/15 0834 98 %     Weight 11/22/15 0834 189 lb (85.73 kg)     Height 11/22/15 0834 5\' 11"  (1.803 m)     Head Cir --      Peak Flow --      Pain Score 11/22/15 0835 10     Pain Loc --      Pain Edu? --      Excl. in [place]? --     Constitutional: Alert and oriented. Looks tired and ill Eyes: Conjunctivae are normal. PERRL. EOMI. Head: Atraumatic. Nose: No congestion/rhinnorhea. Mouth/Throat: Mucous membranes are moist.  Oropharynx non-erythematous. Neck: No stridor. Cardiovascular: Normal rate, regular rhythm. Grossly normal heart sounds.  Good peripheral circulation. Respiratory: Normal respiratory effort.  No retractions. Lungs CTAB. Gastrointestinal: Soft and nontender to palpation. Patient says it hurts inside. No distention. No abdominal bruits. No CVA tenderness. Musculoskeletal: No lower extremity tenderness nor edema.  No joint effusions. Neurologic:  Normal speech and language. No gross focal neurologic deficits are appreciated. No gait instability. Skin:  Skin is warm, dry and intact. No rash noted. Psychiatric: Mood and affect are normal. Speech and behavior are normal.  ____________________________________________   LABS (all labs ordered are listed, but only abnormal results are displayed)  Labs Reviewed  COMPREHENSIVE METABOLIC PANEL - Abnormal; Notable for the following:    Sodium 132 (*)    Glucose, Bld 105 (*)    BUN 25 (*)    Calcium 8.3 (*)    Total Protein 5.1 (*)    Albumin 3.0 (*)    All other components within normal limits  LIPASE, BLOOD  CBC WITH DIFFERENTIAL/PLATELET  URINALYSIS COMPLETEWITH MICROSCOPIC ([facility] ONLY)   ____________________________________________  EKG  EKG read and interpreted by me shows sinus tachycardia at 116 0 axis nonspecific ST-T wave changes ____________________________________________  RADIOLOGY  Chest x-ray  shows improvement of the hilar fullness and the effusion there is some new atelectasis however. CT scan shows multiple nodules and nodes. There is also some inflammation in the paracolic gutter which could represent some mild diverticulitis.   ____________________________________________ Patient feels well at present patient has no further abdominal pain patient was ready to go home and wanted to go home then he ate something and had some diarrhea and now he is nervous about going home. I explained to him that  with no abdominal pain and able being able to eat having a normal white count and very vague small amount of possible inflammation and really cannot keep him in the hospital. I discussed his case with Dr. Dante Gang day the oncologist on call who also talked with Dr. Grayland Ormond about him and they are happy with the idea of Cipro and Flagyl and follow up.  FINAL CLINICAL IMPRESSION(S) / ED DIAGNOSES  Final diagnoses:  Diarrhea, unspecified type  Abdominal pain, unspecified abdominal location       Nena Polio, MD 11/22/15 1606

## 2015-11-22 NOTE — ED Provider Notes (Signed)
-----------------------------------------   11:09 PM on 11/22/2015 -----------------------------------------  Patient was not seen or evaluated by me however I was made aware of findings of PCR which apparently shows, somewhat remarkably, Salmonella, yersinia, and Escherichia coli. I discussed with his oncologist Dr. Yevette Edwards, who feels comfortable with Cipro and Flagyl as a first line, he and I discussed the patient's CT findings, reported abdominal exam, blood work and CT results. Patient is a cancer patient. His oncologist feel the patient is safe for continued stay at home tonight and they will follow-up with him tomorrow to ensure that he is improving.  Schuyler Amor, MD 11/22/15 [id number]

## 2015-11-22 NOTE — ED Notes (Signed)
Abdominal pain, diarrhea and nausea that began around 0100 this am. Pt being tx with radiation for lymphoma at this time. Pt weak, appears pale.

## 2015-11-23 ENCOUNTER — Inpatient Hospital Stay: Payer: Medicare Other

## 2015-11-23 ENCOUNTER — Ambulatory Visit
Admission: RE | Admit: 2015-11-23 | Discharge: 2015-11-23 | Disposition: A | Discharge status: Home / Self Care | Payer: Medicare Other | Source: Ambulatory Visit | Attending: Radiation Oncology | Admitting: Radiation Oncology

## 2015-11-23 ENCOUNTER — Other Ambulatory Visit: Payer: Self-pay | Admitting: *Deleted

## 2015-11-23 ENCOUNTER — Inpatient Hospital Stay (HOSPITAL_BASED_OUTPATIENT_CLINIC_OR_DEPARTMENT_OTHER): Payer: Medicare Other | Admitting: Oncology

## 2015-11-23 ENCOUNTER — Ambulatory Visit: Admission: RE | Admit: 2015-11-23 | Payer: Medicare Other | Source: Ambulatory Visit

## 2015-11-23 ENCOUNTER — Telehealth: Payer: Self-pay | Admitting: Urgent Care

## 2015-11-23 ENCOUNTER — Other Ambulatory Visit: Payer: Self-pay | Admitting: Internal Medicine

## 2015-11-23 VITALS — BP 117/78 | HR 120 | Temp 96.6°F | Resp 18

## 2015-11-23 DIAGNOSIS — R0602 Shortness of breath: Secondary | ICD-10-CM

## 2015-11-23 DIAGNOSIS — Z79899 Other long term (current) drug therapy: Secondary | ICD-10-CM

## 2015-11-23 DIAGNOSIS — T451X5S Adverse effect of antineoplastic and immunosuppressive drugs, sequela: Secondary | ICD-10-CM

## 2015-11-23 DIAGNOSIS — Z7982 Long term (current) use of aspirin: Secondary | ICD-10-CM

## 2015-11-23 DIAGNOSIS — R062 Wheezing: Secondary | ICD-10-CM

## 2015-11-23 DIAGNOSIS — R110 Nausea: Secondary | ICD-10-CM

## 2015-11-23 DIAGNOSIS — D6481 Anemia due to antineoplastic chemotherapy: Secondary | ICD-10-CM

## 2015-11-23 DIAGNOSIS — D72828 Other elevated white blood cell count: Secondary | ICD-10-CM | POA: Diagnosis not present

## 2015-11-23 DIAGNOSIS — E860 Dehydration: Secondary | ICD-10-CM | POA: Diagnosis not present

## 2015-11-23 DIAGNOSIS — F329 Major depressive disorder, single episode, unspecified: Secondary | ICD-10-CM

## 2015-11-23 DIAGNOSIS — C8448 Peripheral T-cell lymphoma, not classified, lymph nodes of multiple sites: Secondary | ICD-10-CM | POA: Diagnosis not present

## 2015-11-23 DIAGNOSIS — A044 Other intestinal Escherichia coli infections: Secondary | ICD-10-CM

## 2015-11-23 DIAGNOSIS — F32A Depression, unspecified: Secondary | ICD-10-CM

## 2015-11-23 DIAGNOSIS — G620 Drug-induced polyneuropathy: Secondary | ICD-10-CM

## 2015-11-23 MED ORDER — ONDANSETRON HCL 4 MG PO TABS: 4.0000 mg | ORAL_TABLET | Freq: Three times a day (TID) | ORAL | Status: AC | PRN

## 2015-11-23 MED ORDER — HEPARIN SOD (PORK) LOCK FLUSH 100 UNIT/ML IV SOLN
500.0000 [IU] | Freq: Once | INTRAVENOUS | Status: AC
  Administered 2015-11-23: 500 [IU] via INTRAVENOUS

## 2015-11-23 MED ORDER — SODIUM CHLORIDE 0.9 % IV SOLN
Freq: Once | INTRAVENOUS | Status: AC
  Administered 2015-11-23: 11:00:00 via INTRAVENOUS
  Filled 2015-11-23: qty 1000

## 2015-11-23 MED ORDER — SODIUM CHLORIDE 0.9% FLUSH
10.0000 mL | Freq: Once | INTRAVENOUS | Status: AC
  Administered 2015-11-23: 10 mL via INTRAVENOUS
  Filled 2015-11-23: qty 10

## 2015-11-23 MED ORDER — OMEPRAZOLE 40 MG PO CPDR: 40.0000 mg | DELAYED_RELEASE_CAPSULE | Freq: Every day | ORAL | Status: AC

## 2015-11-23 MED ORDER — CITALOPRAM HYDROBROMIDE 10 MG PO TABS: 10.0000 mg | ORAL_TABLET | Freq: Every day | ORAL | Status: DC

## 2015-11-23 MED ORDER — HEPARIN SOD (PORK) LOCK FLUSH 100 UNIT/ML IV SOLN
INTRAVENOUS | Status: AC
  Filled 2015-11-23: qty 5

## 2015-11-23 NOTE — ED Notes (Signed)
Addendum from 11/22/15 @ 2215 - Patient discharged from the ED at [facility] would not allow this RN to enter a note for the time that the lab communication was received. Lab reporting that patient's stool (+) for: Salmonella, Yersinia entercolitica, and Enteroaggregative E. Coli. Results communicated to Dr. Burlene Arnt. Of note, patient sent home on Cipro and Flagyl earlier today. MD placed called to oncologist on call - advised that they would follow up with patient.

## 2015-11-23 NOTE — Progress Notes (Signed)
Patient was here for XRT but he refused treatment due to not feeling well.  Went to ER yesterday and is not feeling any better today.  His symptoms include, nausea with no appetite, abdominal bloating which is very uncomfortable, and weakness.

## 2015-11-23 NOTE — Progress Notes (Signed)
[place]  Telephone:([phone]) [phone] Fax:([phone]) [phone]  ID: Erik Shelton OB: 10/13/1940  MR#: [id number]  [id number]  Patient Care Team: [name], MD as PCP - General (Internal Medicine)  CHIEF COMPLAINT:  Chief Complaint  Patient presents with  . Acute Visit    INTERVAL HISTORY: Patient returns to clinic today for follow up from his recent hospitalization. He went to the ER on November 22, 2015 for abdominal pain, weakness, diarrhea and nausea. He was found to have Salmonella, Yersinia entercolitica, and Enteroaggregative E. Coli in his stool.  He was sent home on Cipro and Flagyl. He refused XRT today because he does not feel well. He is complaining of nausea, abdominal bloating and pain, and weakness. He is not eating or drinking much due to nausea. Diarrhea has resolved. He is also depressed and wonders if he should take something for depression.  He also complains of numbness in his fingers and upper legs but no pain.  He denies fevers or chills.  REVIEW OF SYSTEMS:   Review of Systems  Constitutional: Negative for fever, positive for malaise/fatigue and decreased appetite.  Respiratory: Negative for cough, wheezing.   Cardiovascular: Negative.   Gastrointestinal: Positive for nausea and abdominal pain and bloating.  Musculoskeletal: Positive for weakness.   Neurological: Positive for sensory change.   Endo/Heme/Allergies: Does not bruise/bleed easily.   As per HPI. Otherwise, a complete review of systems is negatve.  PAST MEDICAL HISTORY: Past Medical History  Diagnosis Date  . Stroke ([place])   . Seizures ([place])   . Hypercholesteremia   . HTN (hypertension)   . Cancer ([place])     T-Cell Lymphoma  . Seizures ([place])     PAST SURGICAL HISTORY: Past Surgical History  Procedure Laterality Date  . Peripheral vascular catheterization N/A 04/24/2015    Procedure: Glori Luis Cath Insertion;  Surgeon: Algernon Huxley, MD;  Location: [place] [facility];   Service: Cardiovascular;  Laterality: N/A;  . Portacath placement      FAMILY HISTORY: Reviewed and unchanged. No reported history of malignancy or chronic disease.     ADVANCED DIRECTIVES:    HEALTH MAINTENANCE: Social History  Substance Use Topics  . Smoking status: Former Research scientist (life sciences)  . Smokeless tobacco: Not on file     Comment: Quit  in 1990  . Alcohol Use: Yes     Comment: Occassional glass of wine     Colonoscopy:  PAP:  Bone density:  Lipid panel:  No Known Allergies  Current Outpatient Prescriptions  Medication Sig Dispense Refill  . acetaminophen (TYLENOL) 325 MG tablet Take 650 mg by mouth every 6 (six) hours as needed for mild pain, moderate pain, fever or headache.    . Albuterol Sulfate 108 (90 Base) MCG/ACT AEPB Inhale 1 puff into the lungs every 6 (six) hours as needed. 1 each 12  . aspirin EC 325 MG tablet Take 325 mg by mouth daily.    . ciprofloxacin (CIPRO) 500 MG tablet Take 1 tablet (500 mg total) by mouth 2 (two) times daily. 20 tablet 0  . dexamethasone (DECADRON) 4 MG tablet Take 1 tablet (4 mg total) by mouth 2 (two) times daily with a meal. Until 11/22/15.  Then take 1 tablet daily x7 days, then 1 tablet every other day until finished 20 tablet 0  . lidocaine-prilocaine (EMLA) cream Apply 1 application topically as needed. 30 g 2  . losartan (COZAAR) 50 MG tablet Take 50 mg by mouth daily.    Marland Kitchen  metroNIDAZOLE (FLAGYL) 500 MG tablet Take 1 tablet (500 mg total) by mouth 3 (three) times daily. 30 tablet 0  . omeprazole (PRILOSEC) 40 MG capsule Take 1 capsule (40 mg total) by mouth daily. 30 capsule 2  . oxyCODONE-acetaminophen (ROXICET) 5-325 MG tablet Take 1 tablet by mouth every 4 (four) hours as needed for severe pain. 60 tablet 0  . polyethylene glycol (MIRALAX / GLYCOLAX) packet Take 17 g by mouth daily as needed for moderate constipation. 30 each 0  . pravastatin (PRAVACHOL) 80 MG tablet Take 80 mg by mouth daily.    . citalopram (CELEXA) 10 MG tablet  Take 1 tablet (10 mg total) by mouth daily. 30 tablet 1   No current facility-administered medications for this visit.    OBJECTIVE: Filed Vitals:   11/23/15 1030  BP: 117/78  Pulse: 120  Temp: 96.6 F (35.9 C)  Resp: 18     There is no weight on file to calculate BMI.    ECOG FS:0 - Asymptomatic  General: Well-developed, well-nourished, no acute distress. Eyes: Pink conjunctiva, anicteric sclera. Lungs: Clear to auscultation bilaterally. Heart: Regular rate and rhythm. No rubs, murmurs, or gallops. Abdomen: Soft, nontender, distended. No organomegaly noted, normoactive bowel sounds. Musculoskeletal: No edema, cyanosis, or clubbing. Neuro: Alert, answering all questions appropriately. Cranial nerves grossly intact. Skin: No rashes or petechiae noted. Psych: Normal affect. Lymphatics: No palpable lymphadenopathy.   LAB RESULTS:  Lab Results  Component Value Date   NA 132* 11/22/2015   K 4.3 11/22/2015   CL 101 11/22/2015   CO2 26 11/22/2015   GLUCOSE 105* 11/22/2015   BUN 25* 11/22/2015   CREATININE 0.67 11/22/2015   CALCIUM 8.3* 11/22/2015   PROT 5.1* 11/22/2015   ALBUMIN 3.0* 11/22/2015   AST 15 11/22/2015   ALT 28 11/22/2015   ALKPHOS 94 11/22/2015   BILITOT 0.7 11/22/2015   GFRNONAA >60 11/22/2015   GFRAA >60 11/22/2015    Lab Results  Component Value Date   WBC 8.8 11/22/2015   NEUTROABS 7.9* 11/22/2015   HGB 12.2* 11/22/2015   HCT 38.4* 11/22/2015   MCV 82.5 11/22/2015   PLT 92* 11/22/2015     STUDIES: Dg Chest 2 View  11/22/2015  CLINICAL DATA:  Abdominal pain and diarrhea, history of lymphoma with shortness of Breath EXAM: CHEST  2 VIEW COMPARISON:  10/24/2015 FINDINGS: Cardiac shadow remains mildly enlarged. A right chest wall port is again seen and stable. The previously noted right pleural effusion has resolved in the interval. The fullness in the hila bilaterally has also reduced somewhat in the interval from the prior exam. Mild left basilar  atelectasis is seen. IMPRESSION: Improvement in the degree of hilar fullness and right-sided pleural effusion. New left basilar atelectasis. Electronically Signed   By: Inez Catalina M.D.   On: 11/22/2015 10:30   Ct Abdomen Pelvis W Contrast  11/22/2015  CLINICAL DATA:  Abdominal pain, diarrhea and nausea beginning at 0100 hours, lymphoma being treated with radiation at this time, weak, pale, hypertension, former smoker EXAM: CT ABDOMEN AND PELVIS WITH CONTRAST TECHNIQUE: Multidetector CT imaging of the abdomen and pelvis was performed using the standard protocol following bolus administration of intravenous contrast. Sagittal and coronal MPR images reconstructed from axial data set. CONTRAST:  145mL OMNIPAQUE IOHEXOL 300 MG/ML SOLN IV. Dilute oral contrast. COMPARISON:  PET-CT 10/20/2015 FINDINGS: BILATERAL hilar adenopathy nodes measuring up to 18 mm short axis RIGHT image 1 and 11 mm short axis LEFT image 2, corresponding to enlarged  nodes with FDG localization on prior PET-CT. Visualized lung bases clear. Multiple BILATERAL renal cysts largest upper pole RIGHT kidney 6.2 x 5.2 cm image 34. Liver, gallbladder, spleen, pancreas, kidneys, and adrenal glands otherwise normal. Probable small splenule 2.1 cm diameter inferior to spleen unchanged. Area of nodularity along the inner surface of the LEFT upper quadrant posterior lateral abdominal wall fascia 11 x 5 mm image 37 unchanged since 11/23/2013. Scattered normal sized retroperitoneal lymph nodes. 10 mm gastrohepatic ligament node image 24 previously 14 mm. 10 mm short axis peripancreatic node image 25 previously 11 mm. BILATERAL inguinal hernias containing fat. Normal appendix. Diffuse colonic diverticulosis greatest at distal descending and sigmoid colon. Questionable hyperemia and minimal infiltrative changes at the proximal sigmoid colon cannot exclude subtle diverticulitis changes. Stomach and bowel loops otherwise normal appearance. Few scattered  atherosclerotic calcifications without aneurysm. Normal appearing bladder ureters, and prostate gland. No mass, additional adenopathy, free air, or free fluid. Degenerative disc disease changes L4-L5 and L5-S1 without focal bony destruction. IMPRESSION: BILATERAL inferior hilar adenopathy. BILATERAL renal cysts. BILATERAL inguinal hernias containing fat. Diffuse colonic diverticulosis with questionable subtle acute diverticulitis of the proximal sigmoid colon. Scattered normal size to borderline enlarged intra-abdominal lymph nodes as above. Electronically Signed   By: Lavonia Dana M.D.   On: 11/22/2015 11:16   US Venous Img Lower Unilateral Right  10/26/2015  CLINICAL DATA:  Right lower extremity swelling. EXAM: RIGHT LOWER EXTREMITY VENOUS DOPPLER ULTRASOUND TECHNIQUE: Gray-scale sonography with graded compression, as well as color Doppler and duplex ultrasound were performed to evaluate the lower extremity deep venous systems from the level of the common femoral vein and including the common femoral, femoral, profunda femoral, popliteal and calf veins including the posterior tibial, peroneal and gastrocnemius veins when visible. The superficial great saphenous vein was also interrogated. Spectral Doppler was utilized to evaluate flow at rest and with distal augmentation maneuvers in the common femoral, femoral and popliteal veins. COMPARISON:  PET-CT 11/09/2015. FINDINGS: Contralateral Common Femoral Vein: Respiratory phasicity is normal and symmetric with the symptomatic side. No evidence of thrombus. Normal compressibility. Common Femoral Vein: No evidence of thrombus. Normal compressibility, respiratory phasicity and response to augmentation. Saphenofemoral Junction: No evidence of thrombus. Normal compressibility and flow on color Doppler imaging. Profunda Femoral Vein: No evidence of thrombus. Normal compressibility and flow on color Doppler imaging. Femoral Vein: No evidence of thrombus. Normal  compressibility, respiratory phasicity and response to augmentation. Popliteal Vein: No evidence of thrombus. Normal compressibility, respiratory phasicity and response to augmentation. Calf Veins: No evidence of thrombus. Normal compressibility and flow on color Doppler imaging. Superficial Great Saphenous Vein: No evidence of thrombus. Normal compressibility and flow on color Doppler imaging. Other Findings:  1.2 x 2.6 x 4.5 cm lymph node right groin. IMPRESSION: No evidence of deep venous thrombosis. Right inguinal lymphadenopathy. Electronically Signed   By: Marcello Moores  Register   On: 10/26/2015 11:41   ONCOLOGIC TREATMENT HISTORY: Patient received 3 cycles of CHOP between January and March 2014 achieving a complete remission. Upon recurrence, patient received 4 cycle CHOP plus etoposide between August and October 2016. Patient received his lifetime dose of Adriamycin which was dropped from his regimen and he received 4 additional cycles of etoposide plus CVP from November 2016 through February 2017.  ASSESSMENT: Recurrent stage IV T-cell lymphoma.  PLAN:    1. T-cell lymphoma:  PET scan and CT scan results reviewed independently with evidence of progression of disease. Patient will return to clinic next week to proceed with  cycle 2 salvage chemotherapy using gemcitabine and oxaliplatin. Will continue with Onpro Neulasta as well.  2. Leukocytosis: Secondary to OnPro Neulasta as above. 3. Anemia: Secondary to chemotherapy, monitor. 4. Neutropenic fevers: Resolved. 5. Pain: Resolved. 6. Wheezing /shortness of breath: Continue albuterol, possibly secondary to progression of disease. Patient had consultation with radiation oncology for XRT for symptomatic treatment. 7. Nausea: Patient to use ondansetron PRN. 8. Dehydration: 1L IV Fluids today. 9. GI infection: Continue with Cipro and Flagyl as ordered. Case was discussed with ID. 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 Monitor. 11.  Depression: Start Celexa 10 mg daily.  Approximately 30 minutes was spent in discussion of which greater than 50% was consultation.  Patient expressed understanding and was in agreement with this plan. He also understands that He can call clinic at any time with any questions, concerns, or complaints.   Mayra Reel, NP   11/23/2015 1:49 PM   Patient was seen and evaluated independently and I agree with the assessment and plan as above.  Lloyd Huger, MD 11/24/2015 11:21 PM

## 2015-11-26 NOTE — Progress Notes (Signed)
[place]  [phone] [phone]  ID: Erik Shelton OB: 06/15/1941  MR#: [id number]  [id number]  Patient Care Team: [name], MD as PCP - General (Internal Medicine)  CHIEF COMPLAINT:  Chief Complaint  Patient presents with  . Lymphoma    INTERVAL HISTORY: Patient returns to clinic today for further evaluation and initiation of salvage, palliative chemotherapy with gemcitabine and oxaliplatin. He feels slightly improved since initiating XRT. He continues to have mild shortness of breath. He does not complain of pain today.  He otherwise feels well. He has increased anxiety today.  He denies fevers or chills. He does not complain of weakness or fatigue today. He does not complain of nausea today and his appetite is fair.  He denies any vomiting, constipation, or diarrhea. Patient offers no further specific complaints today.  REVIEW OF SYSTEMS:   Review of Systems  Constitutional: Negative for fever and malaise/fatigue.  Respiratory: Positive for cough, wheezing.   Cardiovascular: Negative.   Gastrointestinal: Negative for nausea and abdominal pain.  Musculoskeletal: Negative.   Neurological: Negative for weakness.  Endo/Heme/Allergies: Does not bruise/bleed easily.    As per HPI. Otherwise, a complete review of systems is negatve.  PAST MEDICAL HISTORY: Past Medical History  Diagnosis Date  . Stroke ([place])   . Seizures ([place])   . Hypercholesteremia   . HTN (hypertension)   . Cancer ([place])     T-Cell Lymphoma  . Seizures ([place])     PAST SURGICAL HISTORY: Past Surgical History  Procedure Laterality Date  . Peripheral vascular catheterization N/A 04/24/2015    Procedure: Glori Luis Cath Insertion;  Surgeon: Algernon Huxley, MD;  Location: [facility];  Service: Cardiovascular;  Laterality: N/A;  . Portacath placement      FAMILY HISTORY: Reviewed and unchanged. No reported history of malignancy or chronic  disease.     ADVANCED DIRECTIVES:    HEALTH MAINTENANCE: Social History  Substance Use Topics  . Smoking status: Former Research scientist (life sciences)  . Smokeless tobacco: Not on file     Comment: Quit  in 1990  . Alcohol Use: Yes     Comment: Occassional glass of wine     Colonoscopy:  PAP:  Bone density:  Lipid panel:  No Known Allergies  Current Outpatient Prescriptions  Medication Sig Dispense Refill  . acetaminophen (TYLENOL) 325 MG tablet Take 650 mg by mouth every 6 (six) hours as needed for mild pain, moderate pain, fever or headache.    . Albuterol Sulfate 108 (90 Base) MCG/ACT AEPB Inhale 1 puff into the lungs every 6 (six) hours as needed. 1 each 12  . aspirin EC 325 MG tablet Take 325 mg by mouth daily.    Marland Kitchen lidocaine-prilocaine (EMLA) cream Apply 1 application topically as needed. 30 g 2  . losartan (COZAAR) 50 MG tablet Take 50 mg by mouth daily.    Marland Kitchen oxyCODONE-acetaminophen (ROXICET) 5-325 MG tablet Take 1 tablet by mouth every 4 (four) hours as needed for severe pain. 60 tablet 0  . polyethylene glycol (MIRALAX / GLYCOLAX) packet Take 17 g by mouth daily as needed for moderate constipation. 30 each 0  . pravastatin (PRAVACHOL) 80 MG tablet Take 80 mg by mouth daily.    . ciprofloxacin (CIPRO) 500 MG tablet Take 1 tablet (500 mg total) by mouth 2 (two) times daily. 20 tablet 0  . citalopram (CELEXA) 10 MG tablet Take 1 tablet (10 mg total) by mouth daily. 30 tablet 1  . dexamethasone (  DECADRON) 4 MG tablet Take 1 tablet (4 mg total) by mouth 2 (two) times daily with a meal. Until 11/22/15.  Then take 1 tablet daily x7 days, then 1 tablet every other day until finished 20 tablet 0  . metroNIDAZOLE (FLAGYL) 500 MG tablet Take 1 tablet (500 mg total) by mouth 3 (three) times daily. 30 tablet 0  . omeprazole (PRILOSEC) 40 MG capsule Take 1 capsule (40 mg total) by mouth daily. 90 capsule 2  . ondansetron (ZOFRAN) 4 MG tablet Take 1 tablet (4 mg total) by mouth every 8 (eight) hours as needed  for nausea. 30 tablet 1   No current facility-administered medications for this visit.    OBJECTIVE: Filed Vitals:   11/15/15 1004  BP: 154/93  Pulse: 86  Temp: 98.7 F (37.1 C)  Resp: 18     Body mass index is 26.61 kg/(m^2).    ECOG FS:1 - Symptomatic but completely ambulatory  General: Well-developed, well-nourished, no acute distress. Eyes: Pink conjunctiva, anicteric sclera. Lungs: Clear to auscultation bilaterally. Heart: Regular rate and rhythm. No rubs, murmurs, or gallops. Abdomen: Soft, nontender, nondistended. No organomegaly noted, normoactive bowel sounds. Musculoskeletal: No edema, cyanosis, or clubbing. Neuro: Alert, answering all questions appropriately. Cranial nerves grossly intact. Skin: No rashes or petechiae noted. Psych: Normal affect. Lymphatics: No palpable lymphadenopathy.   LAB RESULTS:  Lab Results  Component Value Date   NA 132* 11/22/2015   K 4.3 11/22/2015   CL 101 11/22/2015   CO2 26 11/22/2015   GLUCOSE 105* 11/22/2015   BUN 25* 11/22/2015   CREATININE 0.67 11/22/2015   CALCIUM 8.3* 11/22/2015   PROT 5.1* 11/22/2015   ALBUMIN 3.0* 11/22/2015   AST 15 11/22/2015   ALT 28 11/22/2015   ALKPHOS 94 11/22/2015   BILITOT 0.7 11/22/2015   GFRNONAA >60 11/22/2015   GFRAA >60 11/22/2015    Lab Results  Component Value Date   WBC 8.8 11/22/2015   NEUTROABS 7.9* 11/22/2015   HGB 12.2* 11/22/2015   HCT 38.4* 11/22/2015   MCV 82.5 11/22/2015   PLT 92* 11/22/2015     STUDIES: Dg Chest 2 View  11/22/2015  CLINICAL DATA:  Abdominal pain and diarrhea, history of lymphoma with shortness of Breath EXAM: CHEST  2 VIEW COMPARISON:  10/24/2015 FINDINGS: Cardiac shadow remains mildly enlarged. A right chest wall port is again seen and stable. The previously noted right pleural effusion has resolved in the interval. The fullness in the hila bilaterally has also reduced somewhat in the interval from the prior exam. Mild left basilar atelectasis is  seen. IMPRESSION: Improvement in the degree of hilar fullness and right-sided pleural effusion. New left basilar atelectasis. Electronically Signed   By: Inez Catalina M.D.   On: 11/22/2015 10:30   Ct Abdomen Pelvis W Contrast  11/22/2015  CLINICAL DATA:  Abdominal pain, diarrhea and nausea beginning at 0100 hours, lymphoma being treated with radiation at this time, weak, pale, hypertension, former smoker EXAM: CT ABDOMEN AND PELVIS WITH CONTRAST TECHNIQUE: Multidetector CT imaging of the abdomen and pelvis was performed using the standard protocol following bolus administration of intravenous contrast. Sagittal and coronal MPR images reconstructed from axial data set. CONTRAST:  148mL OMNIPAQUE IOHEXOL 300 MG/ML SOLN IV. Dilute oral contrast. COMPARISON:  PET-CT 10/20/2015 FINDINGS: BILATERAL hilar adenopathy nodes measuring up to 18 mm short axis RIGHT image 1 and 11 mm short axis LEFT image 2, corresponding to enlarged nodes with FDG localization on prior PET-CT. Visualized lung bases clear. Multiple  BILATERAL renal cysts largest upper pole RIGHT kidney 6.2 x 5.2 cm image 34. Liver, gallbladder, spleen, pancreas, kidneys, and adrenal glands otherwise normal. Probable small splenule 2.1 cm diameter inferior to spleen unchanged. Area of nodularity along the inner surface of the LEFT upper quadrant posterior lateral abdominal wall fascia 11 x 5 mm image 37 unchanged since 11/23/2013. Scattered normal sized retroperitoneal lymph nodes. 10 mm gastrohepatic ligament node image 24 previously 14 mm. 10 mm short axis peripancreatic node image 25 previously 11 mm. BILATERAL inguinal hernias containing fat. Normal appendix. Diffuse colonic diverticulosis greatest at distal descending and sigmoid colon. Questionable hyperemia and minimal infiltrative changes at the proximal sigmoid colon cannot exclude subtle diverticulitis changes. Stomach and bowel loops otherwise normal appearance. Few scattered atherosclerotic  calcifications without aneurysm. Normal appearing bladder ureters, and prostate gland. No mass, additional adenopathy, free air, or free fluid. Degenerative disc disease changes L4-L5 and L5-S1 without focal bony destruction. IMPRESSION: BILATERAL inferior hilar adenopathy. BILATERAL renal cysts. BILATERAL inguinal hernias containing fat. Diffuse colonic diverticulosis with questionable subtle acute diverticulitis of the proximal sigmoid colon. Scattered normal size to borderline enlarged intra-abdominal lymph nodes as above. Electronically Signed   By: Lavonia Dana M.D.   On: 11/22/2015 11:16   ONCOLOGIC TREATMENT HISTORY: Patient received 3 cycles of CHOP between January and March 2014 achieving a complete remission. Upon recurrence, patient received 4 cycle CHOP plus etoposide between August and October 2016. Patient received his lifetime dose of Adriamycin which was dropped from his regimen and he received 4 additional cycles of etoposide plus CVP from November 2016 through February 2017.  ASSESSMENT: Recurrent stage IV T-cell lymphoma.  PLAN:    1. T-cell lymphoma:  PET scan and CT scan results reviewed independently with evidence of progression of disease. After lengthy discussion with the patient, he wishes to proceed with salvage chemotherapy. Proceed with cycle 1, day 1 of gemcitabine and oxaliplatin today. Will continue with Onpro Neulasta as well.  Return to clinic in 1 week for laboratory work and further evaluation and then in 2 weeks for consideration of cycle 1, day 15. 2. Leukocytosis: Secondary to OnPro Neulasta as above. 3. Anemia: Secondary to chemotherapy, monitor. 4. Neutropenic fevers: Resolved. 5. Pain: Resolved. 6. Wheezing /shortness of breath: Continue XRT. Continue albuterol, possibly secondary to progression of disease.   Patient expressed understanding and was in agreement with this plan. He also understands that He can call clinic at any time with any questions, concerns,  or complaints.   Lloyd Huger, MD   11/26/2015 7:28 AM

## 2015-11-29 ENCOUNTER — Inpatient Hospital Stay: Payer: Medicare Other

## 2015-11-29 ENCOUNTER — Inpatient Hospital Stay (HOSPITAL_BASED_OUTPATIENT_CLINIC_OR_DEPARTMENT_OTHER): Payer: Medicare Other | Admitting: Internal Medicine

## 2015-11-29 VITALS — BP 129/79 | HR 120 | Temp 97.6°F | Resp 18 | Ht 71.0 in | Wt 188.5 lb

## 2015-11-29 DIAGNOSIS — C8448 Peripheral T-cell lymphoma, not classified, lymph nodes of multiple sites: Secondary | ICD-10-CM | POA: Diagnosis not present

## 2015-11-29 DIAGNOSIS — Z7982 Long term (current) use of aspirin: Secondary | ICD-10-CM

## 2015-11-29 DIAGNOSIS — I10 Essential (primary) hypertension: Secondary | ICD-10-CM

## 2015-11-29 DIAGNOSIS — Z87891 Personal history of nicotine dependence: Secondary | ICD-10-CM

## 2015-11-29 DIAGNOSIS — C8442 Peripheral T-cell lymphoma, not classified, intrathoracic lymph nodes: Secondary | ICD-10-CM

## 2015-11-29 DIAGNOSIS — D696 Thrombocytopenia, unspecified: Secondary | ICD-10-CM

## 2015-11-29 DIAGNOSIS — E7800 Pure hypercholesterolemia, unspecified: Secondary | ICD-10-CM

## 2015-11-29 DIAGNOSIS — E871 Hypo-osmolality and hyponatremia: Secondary | ICD-10-CM

## 2015-11-29 DIAGNOSIS — R531 Weakness: Secondary | ICD-10-CM

## 2015-11-29 DIAGNOSIS — R600 Localized edema: Secondary | ICD-10-CM

## 2015-11-29 DIAGNOSIS — Z79899 Other long term (current) drug therapy: Secondary | ICD-10-CM

## 2015-11-29 DIAGNOSIS — F329 Major depressive disorder, single episode, unspecified: Secondary | ICD-10-CM

## 2015-11-29 DIAGNOSIS — Z8673 Personal history of transient ischemic attack (TIA), and cerebral infarction without residual deficits: Secondary | ICD-10-CM

## 2015-11-29 LAB — CBC WITH DIFFERENTIAL/PLATELET
BASOS PCT: 0 %
Basophils Absolute: 0.1 10*3/uL (ref 0–0.1)
EOS PCT: 0 %
Eosinophils Absolute: 0 10*3/uL (ref 0–0.7)
HEMATOCRIT: 33.3 % — AB (ref 40.0–52.0)
Hemoglobin: 10.7 g/dL — ABNORMAL LOW (ref 13.0–18.0)
LYMPHS ABS: 0.6 10*3/uL — AB (ref 1.0–3.6)
LYMPHS PCT: 3 %
MCH: 26.6 pg (ref 26.0–34.0)
MCHC: 32.1 g/dL (ref 32.0–36.0)
MCV: 83 fL (ref 80.0–100.0)
MONO ABS: 0.7 10*3/uL (ref 0.2–1.0)
Monocytes Relative: 4 %
NEUTROS ABS: 17.1 10*3/uL — AB (ref 1.4–6.5)
NEUTROS PCT: 93 %
Platelets: 72 10*3/uL — ABNORMAL LOW (ref 150–440)
RBC: 4.01 MIL/uL — ABNORMAL LOW (ref 4.40–5.90)
RDW: 19.4 % — AB (ref 11.5–14.5)
WBC: 18.5 10*3/uL — ABNORMAL HIGH (ref 3.8–10.6)

## 2015-11-29 LAB — COMPREHENSIVE METABOLIC PANEL
ALK PHOS: 102 U/L (ref 38–126)
ALT: 62 U/L (ref 17–63)
ANION GAP: 5 (ref 5–15)
AST: 38 U/L (ref 15–41)
Albumin: 3.1 g/dL — ABNORMAL LOW (ref 3.5–5.0)
BILIRUBIN TOTAL: 0.4 mg/dL (ref 0.3–1.2)
BUN: 18 mg/dL (ref 6–20)
CALCIUM: 8 mg/dL — AB (ref 8.9–10.3)
CO2: 25 mmol/L (ref 22–32)
Chloride: 97 mmol/L — ABNORMAL LOW (ref 101–111)
Creatinine, Ser: 0.68 mg/dL (ref 0.61–1.24)
GFR calc Af Amer: >60 mL/min (ref >60)
Glucose, Bld: 119 mg/dL — ABNORMAL HIGH (ref 65–99)
POTASSIUM: 3.5 mmol/L (ref 3.5–5.1)
Sodium: 127 mmol/L — ABNORMAL LOW (ref 135–145)
TOTAL PROTEIN: 5.4 g/dL — AB (ref 6.5–8.1)

## 2015-11-29 MED ORDER — SODIUM CHLORIDE 0.9 % IV SOLN
1700.0000 mg | Freq: Once | INTRAVENOUS | Status: AC
  Administered 2015-11-29: 1700 mg via INTRAVENOUS
  Filled 2015-11-29: qty 39.45

## 2015-11-29 MED ORDER — SODIUM CHLORIDE 0.9 % IV SOLN
INTRAVENOUS | Status: DC
  Administered 2015-11-29: 11:00:00 via INTRAVENOUS
  Filled 2015-11-29: qty 1000

## 2015-11-29 MED ORDER — OXALIPLATIN CHEMO INJECTION 100 MG/20ML
80.0000 mg/m2 | Freq: Once | INTRAVENOUS | Status: AC
  Administered 2015-11-29: 170 mg via INTRAVENOUS
  Filled 2015-11-29: qty 34

## 2015-11-29 MED ORDER — PEGFILGRASTIM 6 MG/0.6ML ~~LOC~~ PSKT
6.0000 mg | PREFILLED_SYRINGE | Freq: Once | SUBCUTANEOUS | Status: AC
  Administered 2015-11-29: 6 mg via SUBCUTANEOUS
  Filled 2015-11-29: qty 0.6

## 2015-11-29 MED ORDER — SODIUM CHLORIDE 0.9% FLUSH
10.0000 mL | INTRAVENOUS | Status: DC | PRN
  Administered 2015-11-29: 10 mL
  Filled 2015-11-29: qty 10

## 2015-11-29 MED ORDER — SODIUM CHLORIDE 0.9 % IV SOLN
10.0000 mg | Freq: Once | INTRAVENOUS | Status: AC
  Administered 2015-11-29: 10 mg via INTRAVENOUS
  Filled 2015-11-29: qty 1

## 2015-11-29 MED ORDER — PALONOSETRON HCL INJECTION 0.25 MG/5ML
0.2500 mg | Freq: Once | INTRAVENOUS | Status: AC
  Administered 2015-11-29: 0.25 mg via INTRAVENOUS
  Filled 2015-11-29: qty 5

## 2015-11-29 MED ORDER — DEXTROSE 5 % IV SOLN
Freq: Once | INTRAVENOUS | Status: DC
  Filled 2015-11-29: qty 1000

## 2015-11-29 MED ORDER — HEPARIN SOD (PORK) LOCK FLUSH 100 UNIT/ML IV SOLN
500.0000 [IU] | Freq: Once | INTRAVENOUS | Status: AC | PRN
  Administered 2015-11-29: 500 [IU]
  Filled 2015-11-29: qty 5

## 2015-11-29 NOTE — Progress Notes (Signed)
Pt reports persistent lower extremity edema x 1-2 months per patient's wife.  RN assessment demonstrates +2-+3 pitting edema bilateraly lower extremity noted. Per pt and pt's wife, pt "sits 50% of the day in recliner or chair." He does not elevate his lower extremities. Pt reports numbness and tingling in hands/feet/toes. He stated that he often has difficulty ambulating due to the numbness and heaviness of the lower extremities. He often drops objects that he hold in his hands. He states that he is almost finished with the antibiotics. He has a recent h/o C-diff. He had a bowel movement this morning. He denies any episodes of Nausea/vomiting or diarrhea.

## 2015-11-29 NOTE — Progress Notes (Signed)
Gemcitabine and oxalaplatin doses reduced by 20% due to thrombocytopenia and neuropathy.

## 2015-11-29 NOTE — Progress Notes (Signed)
Platelets: 72,000. MD, Dr. Rogue Bussing, already aware. Per MD, Dr. Rogue Bussing, order: proceed with chemotherapy treatment today.

## 2015-11-29 NOTE — Progress Notes (Signed)
[facility] OFFICE PROGRESS NOTE  Patient Care Team: [name], MD as PCP - General (Internal Medicine)   SUMMARY OF ONCOLOGIC HISTORY:  # Relapsed T-cell lymphoma currently on salvage/gemcitabine and oxaliplatin chemotherapy  INTERVAL HISTORY:  A very pleasant 75 year old male patient with above history of relapsed T-cell lymphoma currently on gemcitabine and oxaliplatin status post cycle #1 is here for follow-up.   In the interim patient was recently diagnosed with-  Multiple  Infectious organisms in the stool including  E coli;  Salmonella;  Yersinia.  Patient has been treated with Cipro and Flagyl.   Patient states his diarrhea is improved.  Denies any abdominal cramps. No fevers.   However patient has noted  Increasing tingling and numbness of his extremities.  He is having difficulty especially with his  Right upper extremity.  Complains of swelling in the legs;  Recent  Ultrasound of the  Lower extremities bilateral recent negative  Following blood clots.  REVIEW OF SYSTEMS:  A complete 10 point review of system is done which is negative except mentioned above/history of present illness.   PAST MEDICAL HISTORY :  Past Medical History  Diagnosis Date  . Stroke ([place])   . Seizures ([place])   . Hypercholesteremia   . HTN (hypertension)   . Cancer ([place])     T-Cell Lymphoma  . Seizures ([place])     PAST SURGICAL HISTORY :   Past Surgical History  Procedure Laterality Date  . Peripheral vascular catheterization N/A 04/24/2015    Procedure: Glori Luis Cath Insertion;  Surgeon: Algernon Huxley, MD;  Location: [facility];  Service: Cardiovascular;  Laterality: N/A;  . Portacath placement      FAMILY HISTORY :  No family history on file.  SOCIAL HISTORY:   Social History  Substance Use Topics  . Smoking status: Former Research scientist (life sciences)  . Smokeless tobacco: Not on file     Comment: Quit  in 1990  . Alcohol Use: Yes     Comment: Occassional glass of wine     ALLERGIES:  has No Known Allergies.  MEDICATIONS:  Current Outpatient Prescriptions  Medication Sig Dispense Refill  . acetaminophen (TYLENOL) 325 MG tablet Take 650 mg by mouth every 6 (six) hours as needed for mild pain, moderate pain, fever or headache.    Marland Kitchen aspirin EC 325 MG tablet Take 325 mg by mouth daily.    . ciprofloxacin (CIPRO) 500 MG tablet Take 1 tablet (500 mg total) by mouth 2 (two) times daily. 20 tablet 0  . dexamethasone (DECADRON) 4 MG tablet Take 1 tablet (4 mg total) by mouth 2 (two) times daily with a meal. Until 11/22/15.  Then take 1 tablet daily x7 days, then 1 tablet every other day until finished 20 tablet 0  . lidocaine-prilocaine (EMLA) cream Apply 1 application topically as needed. 30 g 2  . losartan (COZAAR) 50 MG tablet Take 50 mg by mouth daily.    . metroNIDAZOLE (FLAGYL) 500 MG tablet Take 1 tablet (500 mg total) by mouth 3 (three) times daily. 30 tablet 0  . omeprazole (PRILOSEC) 40 MG capsule Take 1 capsule (40 mg total) by mouth daily. 90 capsule 2  . pravastatin (PRAVACHOL) 80 MG tablet Take 80 mg by mouth daily.    . Albuterol Sulfate 108 (90 Base) MCG/ACT AEPB Inhale 1 puff into the lungs every 6 (six) hours as needed. (Patient not taking: Reported on 11/29/2015) 1 each 12  . ondansetron (ZOFRAN) 4 MG  tablet Take 1 tablet (4 mg total) by mouth every 8 (eight) hours as needed for nausea. (Patient not taking: Reported on 11/29/2015) 30 tablet 1  . oxyCODONE-acetaminophen (ROXICET) 5-325 MG tablet Take 1 tablet by mouth every 4 (four) hours as needed for severe pain. (Patient not taking: Reported on 11/29/2015) 60 tablet 0   No current facility-administered medications for this visit.   Facility-Administered Medications Ordered in Other Visits  Medication Dose Route Frequency Provider Last Rate Last Dose  . heparin lock flush 100 unit/mL  500 Units Intracatheter Once PRN Lloyd Huger, MD      . sodium chloride flush (NS) 0.9 % injection 10 mL  10  mL Intracatheter PRN Lloyd Huger, MD   10 mL at 11/29/15 0844    PHYSICAL EXAMINATION: ECOG PERFORMANCE STATUS: 3 - Symptomatic, >50% confined to bed  BP 129/79 mmHg  Pulse 120  Temp(Src) 97.6 F (36.4 C) (Tympanic)  Resp 18  Ht 5\' 11"  (1.803 m)  Wt 188 lb 7.9 oz (85.5 kg)  BMI 26.30 kg/m2  Filed Weights   11/29/15 0957  Weight: 188 lb 7.9 oz (85.5 kg)    GENERAL: Well-nourished well-developed; Alert, no distress and comfortable.   In a wheel chair. Accompanied by his wife. EYES: no pallor or icterus OROPHARYNX: no thrush or ulceration.  NECK: supple, no masses felt LYMPH:  no palpable lymphadenopathy in the cervical, axillary regions. ingiunal areas cannot be examined.  LUNGS: clear to auscultation and  No wheeze or crackles HEART/CVS: regular rate & rhythm and no murmurs; bil 2-3 plus  lower extremity edema ABDOMEN:abdomen soft, non-tender and normal bowel sounds Musculoskeletal:no cyanosis of digits and no clubbing  PSYCH: alert & oriented x 3 with fluent speech NEURO: no focal motor/sensory deficits SKIN:  no rashes or significant lesions  LABORATORY DATA:  I have reviewed the data as listed    Component Value Date/Time   NA 127* 11/29/2015 0828   NA 139 12/09/2014 0940   K 3.5 11/29/2015 0828   K 4.4 12/09/2014 0940   CL 97* 11/29/2015 0828   CL 105 12/09/2014 0940   CO2 25 11/29/2015 0828   CO2 29 12/09/2014 0940   GLUCOSE 119* 11/29/2015 0828   GLUCOSE 121* 12/09/2014 0940   BUN 18 11/29/2015 0828   BUN 23* 12/09/2014 0940   CREATININE 0.68 11/29/2015 0828   CREATININE 1.08 12/09/2014 0940   CALCIUM 8.0* 11/29/2015 0828   CALCIUM 9.3 12/09/2014 0940   PROT 5.4* 11/29/2015 0828   PROT 7.1 12/28/2012 0826   ALBUMIN 3.1* 11/29/2015 0828   ALBUMIN 3.2* 12/28/2012 0826   AST 38 11/29/2015 0828   AST 17 12/28/2012 0826   ALT 62 11/29/2015 0828   ALT 28 12/28/2012 0826   ALKPHOS 102 11/29/2015 0828   ALKPHOS 90 12/28/2012 0826   BILITOT 0.4  11/29/2015 0828   BILITOT 0.2 12/28/2012 0826   GFRNONAA >60 11/29/2015 0828   GFRNONAA >60 12/09/2014 0940   GFRNONAA >60 12/01/2011 0514   GFRAA >60 11/29/2015 0828   GFRAA >60 12/09/2014 0940   GFRAA >60 12/01/2011 0514    No results found for: SPEP, UPEP  Lab Results  Component Value Date   WBC 18.5* 11/29/2015   NEUTROABS 17.1* 11/29/2015   HGB 10.7* 11/29/2015   HCT 33.3* 11/29/2015   MCV 83.0 11/29/2015   PLT 72* 11/29/2015      Chemistry      Component Value Date/Time   NA 127* 11/29/2015 [id number]  NA 139 12/09/2014 0940   K 3.5 11/29/2015 0828   K 4.4 12/09/2014 0940   CL 97* 11/29/2015 0828   CL 105 12/09/2014 0940   CO2 25 11/29/2015 0828   CO2 29 12/09/2014 0940   BUN 18 11/29/2015 0828   BUN 23* 12/09/2014 0940   CREATININE 0.68 11/29/2015 0828   CREATININE 1.08 12/09/2014 0940      Component Value Date/Time   CALCIUM 8.0* 11/29/2015 0828   CALCIUM 9.3 12/09/2014 0940   ALKPHOS 102 11/29/2015 0828   ALKPHOS 90 12/28/2012 0826   AST 38 11/29/2015 0828   AST 17 12/28/2012 0826   ALT 62 11/29/2015 0828   ALT 28 12/28/2012 0826   BILITOT 0.4 11/29/2015 0828   BILITOT 0.2 12/28/2012 0826         ASSESSMENT & PLAN:   # Relapsed T cell lymphoma- on palliative gemcitabine/oxaliplatin. Status post cycle #1. Patient tolerated chemotherapy fairly well except diarrhea. [C discussion below].  #No obvious clinical progression noted. Proceed with cycle #2 of gemcitabine and oxaliplatin today; however I would recommend dose reduction of both the chemotherapy agents by 20%- because of thrombus cytopenia/ worsening neuropathy.  # Thrombocytopenia platelets of 72/ stable hemoglobin around 10; see discussion above. Leukocytosis likely from onpro.  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 I recommend 20% dose reduction. However if his neuropathy continues to get worse would have to reevaluate the role of oxaliplatin/ offer  different chemotherapy regimen.  # Mild hyponatremia- recommend increased electrolytes.  # Diarrhea likely infectious- resolved status post Cipro Flagyl.  # I had a discussion with the patient's wife regarding the inability to tell if his cancer is getting worse at this time. However a follow-up scan after 4-6 treatments would help [place] decide.   # Leg swelling recent lower extremity venous Dopplers negative for blood clots. Likely dependent edema. Recommend elevation.  # Patient will follow-up with a CBC BMP in 1 week lab only/ Follow-up with Dr. Grayland Ormond in 2 weeks CBC CMP chemotherapy.  # 25 minutes face-to-face with the patient discussing the above plan of care; more than 50% of time spent on prognosis/ natural history; counseling and coordination.      Cammie Sickle, MD 11/29/2015 10:25 AM

## 2015-12-05 ENCOUNTER — Emergency Department: Payer: Medicare Other

## 2015-12-05 ENCOUNTER — Inpatient Hospital Stay
Admission: EM | Admit: 2015-12-05 | Discharge: 2015-12-08 | DRG: 391 | Disposition: A | Discharge status: Home / Self Care | Payer: Medicare Other | Attending: Internal Medicine | Admitting: Internal Medicine

## 2015-12-05 ENCOUNTER — Telehealth: Payer: Self-pay | Admitting: *Deleted

## 2015-12-05 DIAGNOSIS — E785 Hyperlipidemia, unspecified: Secondary | ICD-10-CM | POA: Diagnosis present

## 2015-12-05 DIAGNOSIS — Z79891 Long term (current) use of opiate analgesic: Secondary | ICD-10-CM

## 2015-12-05 DIAGNOSIS — I10 Essential (primary) hypertension: Secondary | ICD-10-CM | POA: Diagnosis present

## 2015-12-05 DIAGNOSIS — R569 Unspecified convulsions: Secondary | ICD-10-CM | POA: Diagnosis present

## 2015-12-05 DIAGNOSIS — E7800 Pure hypercholesterolemia, unspecified: Secondary | ICD-10-CM | POA: Diagnosis present

## 2015-12-05 DIAGNOSIS — I472 Ventricular tachycardia: Secondary | ICD-10-CM | POA: Diagnosis present

## 2015-12-05 DIAGNOSIS — Z79899 Other long term (current) drug therapy: Secondary | ICD-10-CM | POA: Diagnosis not present

## 2015-12-05 DIAGNOSIS — C8440 Peripheral T-cell lymphoma, not classified, unspecified site: Secondary | ICD-10-CM | POA: Diagnosis present

## 2015-12-05 DIAGNOSIS — E43 Unspecified severe protein-calorie malnutrition: Secondary | ICD-10-CM | POA: Diagnosis present

## 2015-12-05 DIAGNOSIS — D696 Thrombocytopenia, unspecified: Secondary | ICD-10-CM | POA: Diagnosis present

## 2015-12-05 DIAGNOSIS — Z8673 Personal history of transient ischemic attack (TIA), and cerebral infarction without residual deficits: Secondary | ICD-10-CM

## 2015-12-05 DIAGNOSIS — Z9221 Personal history of antineoplastic chemotherapy: Secondary | ICD-10-CM | POA: Diagnosis not present

## 2015-12-05 DIAGNOSIS — K5792 Diverticulitis of intestine, part unspecified, without perforation or abscess without bleeding: Secondary | ICD-10-CM | POA: Diagnosis present

## 2015-12-05 DIAGNOSIS — K219 Gastro-esophageal reflux disease without esophagitis: Secondary | ICD-10-CM | POA: Diagnosis present

## 2015-12-05 DIAGNOSIS — Z87891 Personal history of nicotine dependence: Secondary | ICD-10-CM

## 2015-12-05 DIAGNOSIS — E876 Hypokalemia: Secondary | ICD-10-CM | POA: Diagnosis present

## 2015-12-05 DIAGNOSIS — K5732 Diverticulitis of large intestine without perforation or abscess without bleeding: Secondary | ICD-10-CM | POA: Diagnosis present

## 2015-12-05 DIAGNOSIS — Z7982 Long term (current) use of aspirin: Secondary | ICD-10-CM | POA: Diagnosis not present

## 2015-12-05 DIAGNOSIS — Z6825 Body mass index (BMI) 25.0-25.9, adult: Secondary | ICD-10-CM

## 2015-12-05 DIAGNOSIS — C8448 Peripheral T-cell lymphoma, not classified, lymph nodes of multiple sites: Secondary | ICD-10-CM | POA: Diagnosis present

## 2015-12-05 LAB — COMPREHENSIVE METABOLIC PANEL
ALK PHOS: 106 U/L (ref 38–126)
ALT: 45 U/L (ref 17–63)
ANION GAP: 8 (ref 5–15)
AST: 30 U/L (ref 15–41)
Albumin: 2.4 g/dL — ABNORMAL LOW (ref 3.5–5.0)
BILIRUBIN TOTAL: 0.7 mg/dL (ref 0.3–1.2)
BUN: 10 mg/dL (ref 6–20)
CALCIUM: 7.7 mg/dL — AB (ref 8.9–10.3)
CO2: 24 mmol/L (ref 22–32)
Chloride: 95 mmol/L — ABNORMAL LOW (ref 101–111)
Creatinine, Ser: 0.61 mg/dL (ref 0.61–1.24)
Glucose, Bld: 137 mg/dL — ABNORMAL HIGH (ref 65–99)
POTASSIUM: 3.6 mmol/L (ref 3.5–5.1)
Sodium: 127 mmol/L — ABNORMAL LOW (ref 135–145)
TOTAL PROTEIN: 5.5 g/dL — AB (ref 6.5–8.1)

## 2015-12-05 LAB — GASTROINTESTINAL PANEL BY PCR, STOOL (REPLACES STOOL CULTURE)
Adenovirus F40/41: NOT DETECTED
Astrovirus: NOT DETECTED
CRYPTOSPORIDIUM: NOT DETECTED
CYCLOSPORA CAYETANENSIS: NOT DETECTED
Campylobacter species: NOT DETECTED
E. COLI O157: NOT DETECTED
ENTAMOEBA HISTOLYTICA: NOT DETECTED
Enteroaggregative E coli (EAEC): NOT DETECTED
Enteropathogenic E coli (EPEC): NOT DETECTED
Enterotoxigenic E coli (ETEC): NOT DETECTED
Giardia lamblia: NOT DETECTED
Norovirus GI/GII: NOT DETECTED
Plesimonas shigelloides: NOT DETECTED
ROTAVIRUS A: NOT DETECTED
SALMONELLA SPECIES: NOT DETECTED
SAPOVIRUS (I, II, IV, AND V): NOT DETECTED
SHIGA LIKE TOXIN PRODUCING E COLI (STEC): NOT DETECTED
Shigella/Enteroinvasive E coli (EIEC): NOT DETECTED
VIBRIO CHOLERAE: NOT DETECTED
VIBRIO SPECIES: NOT DETECTED
YERSINIA ENTEROCOLITICA: NOT DETECTED

## 2015-12-05 LAB — CBC
HEMATOCRIT: 30.7 % — AB (ref 40.0–52.0)
HEMOGLOBIN: 9.9 g/dL — AB (ref 13.0–18.0)
MCH: 26.6 pg (ref 26.0–34.0)
MCHC: 32.3 g/dL (ref 32.0–36.0)
MCV: 82.4 fL (ref 80.0–100.0)
Platelets: 32 10*3/uL — ABNORMAL LOW (ref 150–440)
RBC: 3.73 MIL/uL — ABNORMAL LOW (ref 4.40–5.90)
RDW: 19 % — AB (ref 11.5–14.5)
WBC: 12 10*3/uL — AB (ref 3.8–10.6)

## 2015-12-05 LAB — C DIFFICILE QUICK SCREEN W PCR REFLEX
C DIFFICILE (CDIFF) INTERP: NEGATIVE
C Diff antigen: NEGATIVE
C Diff toxin: NEGATIVE

## 2015-12-05 LAB — LIPASE, BLOOD: Lipase: 47 U/L (ref 11–51)

## 2015-12-05 MED ORDER — METRONIDAZOLE IN NACL 5-0.79 MG/ML-% IV SOLN
500.0000 mg | Freq: Once | INTRAVENOUS | Status: AC
  Administered 2015-12-05: 500 mg via INTRAVENOUS
  Filled 2015-12-05: qty 100

## 2015-12-05 MED ORDER — ONDANSETRON HCL 4 MG/2ML IJ SOLN
4.0000 mg | Freq: Once | INTRAMUSCULAR | Status: AC
  Administered 2015-12-05: 4 mg via INTRAVENOUS
  Filled 2015-12-05: qty 2

## 2015-12-05 MED ORDER — MORPHINE SULFATE (PF) 4 MG/ML IV SOLN
4.0000 mg | Freq: Once | INTRAVENOUS | Status: AC
  Administered 2015-12-05: 4 mg via INTRAVENOUS
  Filled 2015-12-05: qty 1

## 2015-12-05 MED ORDER — SODIUM CHLORIDE 0.9 % IV BOLUS (SEPSIS)
1000.0000 mL | Freq: Once | INTRAVENOUS | Status: AC
  Administered 2015-12-05: 1000 mL via INTRAVENOUS

## 2015-12-05 MED ORDER — CIPROFLOXACIN IN D5W 400 MG/200ML IV SOLN
400.0000 mg | Freq: Once | INTRAVENOUS | Status: AC
  Administered 2015-12-05: 400 mg via INTRAVENOUS
  Filled 2015-12-05: qty 200

## 2015-12-05 MED ORDER — IOHEXOL 300 MG/ML  SOLN
100.0000 mL | Freq: Once | INTRAMUSCULAR | Status: AC | PRN
  Administered 2015-12-05: 100 mL via INTRAVENOUS

## 2015-12-05 MED ORDER — IOHEXOL 240 MG/ML SOLN
25.0000 mL | Freq: Once | INTRAMUSCULAR | Status: AC | PRN
Start: 2015-12-05 — End: 2015-12-05
  Administered 2015-12-05: 25 mL via ORAL

## 2015-12-05 NOTE — Telephone Encounter (Signed)
Kermit Balo called to report that pt got chemo on 3/15 and recently developed symptoms of weakness, fever of 99, chills, decreased appetite, diarrhea (pt refuses to take imodium), uncontrolled pain. Pt has been taking oxycodone for pain but only finds relief in pain for appx 3 hours at a time.

## 2015-12-05 NOTE — ED Notes (Signed)
Pt comes into the ED with c/o generalized weakness with nausea and diarrhea  For the past 2 days, recently treated for bowel infection.. Pt is currently a chemo therapy pt for lymphoma

## 2015-12-05 NOTE — ED Provider Notes (Signed)
[facility] Emergency Department Provider Note  Time seen: 7:40 PM  I have reviewed the triage vital signs and the nursing notes.   HISTORY  Chief Complaint Weakness; Diarrhea; and Nausea    HPI Erik Shelton is a 75 y.o. male with a past medical history of lymphoma on chemotherapy, seizures, CVA, hypertension, hyperlipidemia, presents the emergency department with abdominal pain, abdominal swelling, and diarrhea. According to the patient and record review the patient has been having diarrhea for approximately 2 weeks with abdominal pain. He had a stool culture performed 11/22/15 which is positive for Salmonella, Yersinia, Escherichia coli. Patient states he took his antibiotics and finished them several days ago however over the past 24-48 hours he feels the diarrhea has once again worsened, his abdominal pain is worsened along with abdominal swelling. Patient states he is having several 3-5 bowel movements per day will have a day.  States nausea but denies vomiting. Denies fever. Last dose of chemotherapy approximately one week ago.     Past Medical History  Diagnosis Date  . Stroke ([place])   . Seizures ([place])   . Hypercholesteremia   . HTN (hypertension)   . Cancer ([place])     T-Cell Lymphoma  . Seizures [facility])     Patient Active Problem List   Diagnosis Date Noted  . Neutropenia ([place]) 08/25/2015  . 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 Sepsis ([place]) 08/04/2015  . Fever and chills 08/04/2015  . Intractable pain 04/14/2015  . 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 Peripheral vascular catheterization N/A 04/24/2015    Procedure: Glori Luis Cath Insertion;  Surgeon: Algernon Huxley, MD;  Location: [facility];  Service: Cardiovascular;  Laterality: N/A;  . Portacath  placement      Current Outpatient Rx  Name  Route  Sig  Dispense  Refill  . acetaminophen (TYLENOL) 325 MG tablet   Oral   Take 650 mg by mouth every 6 (six) hours as needed for mild pain, moderate pain, fever or headache.         . Albuterol Sulfate 108 (90 Base) MCG/ACT AEPB   Inhalation   Inhale 1 puff into the lungs every 6 (six) hours as needed. Patient not taking: Reported on 11/29/2015   1 each   12   . aspirin EC 325 MG tablet   Oral   Take 325 mg by mouth daily.         Marland Kitchen dexamethasone (DECADRON) 4 MG tablet   Oral   Take 1 tablet (4 mg total) by mouth 2 (two) times daily with a meal. Until 11/22/15.  Then take 1 tablet daily x7 days, then 1 tablet every other day until finished   20 tablet   0   . lidocaine-prilocaine (EMLA) cream   Topical   Apply 1 application topically as needed.   30 g   2   . losartan (COZAAR) 50 MG tablet   Oral   Take 50 mg by mouth daily.         . metroNIDAZOLE (FLAGYL) 500 MG tablet   Oral   Take 1 tablet (500 mg total) by mouth 3 (three) times daily.   30 tablet   0   . omeprazole (PRILOSEC) 40 MG capsule   Oral  Take 1 capsule (40 mg total) by mouth daily.   90 capsule   2   . ondansetron (ZOFRAN) 4 MG tablet   Oral   Take 1 tablet (4 mg total) by mouth every 8 (eight) hours as needed for nausea. Patient not taking: Reported on 11/29/2015   30 tablet   1   . oxyCODONE-acetaminophen (ROXICET) 5-325 MG tablet   Oral   Take 1 tablet by mouth every 4 (four) hours as needed for severe pain. Patient not taking: Reported on 11/29/2015   60 tablet   0   . pravastatin (PRAVACHOL) 80 MG tablet   Oral   Take 80 mg by mouth daily.           Allergies Review of patient's allergies indicates no known allergies.  No family history on file.  Social History Social History  Substance Use Topics  . Smoking status: Former Research scientist (life sciences)  . Smokeless tobacco: None     Comment: Quit  in 1990  . Alcohol Use: Yes     Comment:  Occassional glass of wine    Review of Systems Constitutional: Negative for fever. Cardiovascular: Negative for chest pain. Respiratory: Negative for shortness of breath. Gastrointestinal: Positive for abdominal pain and swelling. Positive for nausea. Negative for vomiting. Positive for diarrhea. Genitourinary: Negative for dysuria. Musculoskeletal: Negative for back pain. Neurological: Negative for headache 10-point ROS otherwise negative.  ____________________________________________   PHYSICAL EXAM:  VITAL SIGNS: ED Triage Vitals  Enc Vitals Group     BP 12/05/15 1746 131/103 mmHg     Pulse Rate 12/05/15 1746 143     Resp 12/05/15 1746 22     Temp 12/05/15 1746 98.7 F (37.1 C)     Temp Source 12/05/15 1746 Oral     SpO2 12/05/15 1746 98 %     Weight 12/05/15 1746 188 lb (85.276 kg)     Height 12/05/15 1746 5\' 11"  (1.803 m)     Head Cir --      Peak Flow --      Pain Score 12/05/15 1752 10     Pain Loc --      Pain Edu? --      Excl. in [place]? --     Constitutional: Alert and oriented. Well appearing and in no distress. Eyes: Normal exam ENT   Head: Normocephalic and atraumatic.   Mouth/Throat: Mucous membranes are moist. Cardiovascular: Regular rhythm, rate Remeron 120 bpm. Respiratory: Normal respiratory effort without tachypnea nor retractions. Breath sounds are clear Gastrointestinal: Soft, moderate distention, moderate diffuse abdominal tenderness palpation. No focal tenderness. No rebound or guarding. Musculoskeletal: Nontender with normal range of motion in all extremities. Neurologic:  Normal speech and language. No gross focal neurologic deficits  Skin:  Skin is warm, dry and intact.  Psychiatric: Mood and affect are normal. Speech and behavior are normal. Patient exhibits appropriate insight and judgment.  ____________________________________________    EKG  EKG reviewed and interpreted by myself shows sinus tachycardia 136 bpm, narrow QRS,  normal axis, normal intervals, nonspecific ST changes.  ____________________________________________    RADIOLOGY  CT shows worsening diverticulitis  ____________________________________________   INITIAL IMPRESSION / ASSESSMENT AND PLAN / ED COURSE  Pertinent labs & imaging results that were available during my care of the patient were reviewed by me and considered in my medical decision making (see chart for details).  Patient with a history of lymphoma currently on chemotherapy presents the emergency department with abdominal pain and swelling with diarrhea several  times per day. Patient had a recent culture positive for yersinia, Escherichia coli, and salmonella, patient has completed an antibiotic course which stopped several days ago. Patient states his symptoms have acutely worsened over the past several days. Labs are largely at the patient's baseline with a mild leukocytosis of 12,000. Urinalysis pending. We will IV hydrate, treat nausea and pain, and proceed with a CT abdomen/pelvis. We will also repeat a stool culture as well as a C. difficile test.  Patient stool culture is negative currently. C. difficile negative. Patient remains tachycardic from 120 bpm. CT is consistent with worsening diverticulitis. They finished her antibiotics 2 days ago. Given the fact that they have failed outpatient therapy with continued tachycardia and abdominal pain we will admit to the hospital for diverticulitis treatment with IV antibiotics.  ____________________________________________   FINAL CLINICAL IMPRESSION(S) / ED DIAGNOSES  Abdominal pain Diverticulitis  Harvest Dark, MD 12/05/15 2308

## 2015-12-05 NOTE — ED Notes (Signed)
Patient transported to CT 

## 2015-12-05 NOTE — ED Notes (Signed)
Pt called out to nurses station, reports is unable to drink oral contrast, wife reports "it makes him feel like he has to throw up." MD notified, states ok for NOT to drink contrast. CT tech notified.

## 2015-12-05 NOTE — Telephone Encounter (Signed)
Per Dr. Grayland Ormond, pt may go to ER tonight but if can wait until tomorrow may see md with labwork. Informed Lynda on MD recommendations and encouraged pt to be taken to ER if symptoms worsen throughout the night. Informed Kermit Balo that will schedule appt to see MD on 3/22 with labs and can cancel that appt if pt is seen in the ER instead. Scheduling will notify pt with appt details. Lynda verbalized understanding.

## 2015-12-06 ENCOUNTER — Inpatient Hospital Stay: Payer: Medicare Other | Admitting: Oncology

## 2015-12-06 ENCOUNTER — Inpatient Hospital Stay: Payer: Medicare Other

## 2015-12-06 DIAGNOSIS — K219 Gastro-esophageal reflux disease without esophagitis: Secondary | ICD-10-CM | POA: Diagnosis present

## 2015-12-06 DIAGNOSIS — E785 Hyperlipidemia, unspecified: Secondary | ICD-10-CM | POA: Diagnosis present

## 2015-12-06 DIAGNOSIS — I10 Essential (primary) hypertension: Secondary | ICD-10-CM | POA: Diagnosis present

## 2015-12-06 DIAGNOSIS — E43 Unspecified severe protein-calorie malnutrition: Secondary | ICD-10-CM | POA: Insufficient documentation

## 2015-12-06 LAB — CBC
HCT: 24.8 % — ABNORMAL LOW (ref 40.0–52.0)
Hemoglobin: 8 g/dL — ABNORMAL LOW (ref 13.0–18.0)
MCH: 26.7 pg (ref 26.0–34.0)
MCHC: 32.3 g/dL (ref 32.0–36.0)
MCV: 82.7 fL (ref 80.0–100.0)
PLATELETS: 21 10*3/uL — AB (ref 150–440)
RBC: 3 MIL/uL — AB (ref 4.40–5.90)
RDW: 19.5 % — AB (ref 11.5–14.5)
WBC: 8.5 10*3/uL (ref 3.8–10.6)

## 2015-12-06 LAB — URINALYSIS COMPLETE WITH MICROSCOPIC (ARMC ONLY)
Bacteria, UA: NONE SEEN
Bilirubin Urine: NEGATIVE
GLUCOSE, UA: NEGATIVE mg/dL
Ketones, ur: NEGATIVE mg/dL
LEUKOCYTES UA: NEGATIVE
NITRITE: NEGATIVE
PH: 6 (ref 5.0–8.0)
Protein, ur: 30 mg/dL — AB
SPECIFIC GRAVITY, URINE: 1.045 — AB (ref 1.005–1.030)
Squamous Epithelial / LPF: NONE SEEN

## 2015-12-06 LAB — BASIC METABOLIC PANEL
Anion gap: 4 — ABNORMAL LOW (ref 5–15)
BUN: 7 mg/dL (ref 6–20)
CALCIUM: 7.3 mg/dL — AB (ref 8.9–10.3)
CHLORIDE: 100 mmol/L — AB (ref 101–111)
CO2: 26 mmol/L (ref 22–32)
CREATININE: 0.6 mg/dL — AB (ref 0.61–1.24)
Glucose, Bld: 79 mg/dL (ref 65–99)
Potassium: 3.2 mmol/L — ABNORMAL LOW (ref 3.5–5.1)
SODIUM: 130 mmol/L — AB (ref 135–145)

## 2015-12-06 LAB — MAGNESIUM: MAGNESIUM: 1.3 mg/dL — AB (ref 1.7–2.4)

## 2015-12-06 MED ORDER — ACETAMINOPHEN 325 MG PO TABS: 650.0000 mg | ORAL_TABLET | Freq: Four times a day (QID) | ORAL | Status: DC | PRN

## 2015-12-06 MED ORDER — POTASSIUM CHLORIDE 10 MEQ/100ML IV SOLN
10.0000 meq | INTRAVENOUS | Status: AC
  Administered 2015-12-06 (×4): 10 meq via INTRAVENOUS
  Filled 2015-12-06 (×4): qty 100

## 2015-12-06 MED ORDER — CIPROFLOXACIN IN D5W 400 MG/200ML IV SOLN
400.0000 mg | Freq: Two times a day (BID) | INTRAVENOUS | Status: DC
  Administered 2015-12-06 – 2015-12-07 (×2): 400 mg via INTRAVENOUS
  Filled 2015-12-06 (×4): qty 200

## 2015-12-06 MED ORDER — SODIUM CHLORIDE 0.9 % IV SOLN
INTRAVENOUS | Status: AC
  Administered 2015-12-06: 02:00:00 via INTRAVENOUS

## 2015-12-06 MED ORDER — ONDANSETRON HCL 4 MG/2ML IJ SOLN
4.0000 mg | Freq: Four times a day (QID) | INTRAMUSCULAR | Status: DC | PRN
  Administered 2015-12-06: 11:00:00 4 mg via INTRAVENOUS
  Filled 2015-12-06: qty 2

## 2015-12-06 MED ORDER — PRAVASTATIN SODIUM 40 MG PO TABS
80.0000 mg | ORAL_TABLET | Freq: Every day | ORAL | Status: DC
  Administered 2015-12-06 – 2015-12-08 (×3): 80 mg via ORAL
  Filled 2015-12-06 (×3): qty 2

## 2015-12-06 MED ORDER — IPRATROPIUM-ALBUTEROL 0.5-2.5 (3) MG/3ML IN SOLN
3.0000 mL | Freq: Four times a day (QID) | RESPIRATORY_TRACT | Status: DC
  Administered 2015-12-06 – 2015-12-07 (×4): 3 mL via RESPIRATORY_TRACT
  Filled 2015-12-06 (×4): qty 3

## 2015-12-06 MED ORDER — DEXAMETHASONE 4 MG PO TABS
4.0000 mg | ORAL_TABLET | Freq: Once | ORAL | Status: AC
  Administered 2015-12-06: 15:00:00 4 mg via ORAL
  Filled 2015-12-06: qty 1

## 2015-12-06 MED ORDER — ASPIRIN EC 81 MG PO TBEC
81.0000 mg | DELAYED_RELEASE_TABLET | Freq: Every day | ORAL | Status: DC
  Administered 2015-12-07 – 2015-12-08 (×2): 81 mg via ORAL
  Filled 2015-12-06 (×2): qty 1

## 2015-12-06 MED ORDER — ONDANSETRON HCL 4 MG PO TABS: 4.0000 mg | ORAL_TABLET | Freq: Four times a day (QID) | ORAL | Status: DC | PRN

## 2015-12-06 MED ORDER — METRONIDAZOLE IN NACL 5-0.79 MG/ML-% IV SOLN
500.0000 mg | Freq: Three times a day (TID) | INTRAVENOUS | Status: DC
  Administered 2015-12-06 – 2015-12-07 (×4): 500 mg via INTRAVENOUS
  Filled 2015-12-06 (×7): qty 100

## 2015-12-06 MED ORDER — ASPIRIN EC 325 MG PO TBEC
325.0000 mg | DELAYED_RELEASE_TABLET | Freq: Every day | ORAL | Status: DC
  Administered 2015-12-06: 09:00:00 325 mg via ORAL
  Filled 2015-12-06: qty 1

## 2015-12-06 MED ORDER — MAGNESIUM SULFATE 4 GM/100ML IV SOLN
4.0000 g | Freq: Once | INTRAVENOUS | Status: AC
  Administered 2015-12-06: 11:00:00 4 g via INTRAVENOUS
  Filled 2015-12-06: qty 100

## 2015-12-06 MED ORDER — BOOST / RESOURCE BREEZE PO LIQD
1.0000 | Freq: Three times a day (TID) | ORAL | Status: DC
  Administered 2015-12-06 – 2015-12-08 (×7): 1 via ORAL

## 2015-12-06 MED ORDER — PANTOPRAZOLE SODIUM 40 MG PO TBEC
40.0000 mg | DELAYED_RELEASE_TABLET | Freq: Every day | ORAL | Status: DC
Start: 2015-12-06 — End: 2015-12-08
  Administered 2015-12-06 – 2015-12-08 (×3): 40 mg via ORAL
  Filled 2015-12-06 (×3): qty 1

## 2015-12-06 MED ORDER — LOSARTAN POTASSIUM 50 MG PO TABS
50.0000 mg | ORAL_TABLET | Freq: Every day | ORAL | Status: DC
  Administered 2015-12-06 – 2015-12-08 (×3): 50 mg via ORAL
  Filled 2015-12-06 (×3): qty 1

## 2015-12-06 MED ORDER — METOPROLOL TARTRATE 25 MG PO TABS
25.0000 mg | ORAL_TABLET | Freq: Two times a day (BID) | ORAL | Status: DC
  Administered 2015-12-06 – 2015-12-08 (×4): 25 mg via ORAL
  Filled 2015-12-06 (×4): qty 1

## 2015-12-06 MED ORDER — HYDROCODONE-ACETAMINOPHEN 5-325 MG PO TABS: 1.0000 | ORAL_TABLET | ORAL | Status: DC | PRN

## 2015-12-06 MED ORDER — METOPROLOL TARTRATE 1 MG/ML IV SOLN
5.0000 mg | Freq: Once | INTRAVENOUS | Status: AC
  Administered 2015-12-06: 5 mg via INTRAVENOUS
  Filled 2015-12-06: qty 5

## 2015-12-06 MED ORDER — MORPHINE SULFATE (PF) 4 MG/ML IV SOLN: 4.0000 mg | INTRAVENOUS | Status: DC | PRN

## 2015-12-06 MED ORDER — ACETAMINOPHEN 650 MG RE SUPP: 650.0000 mg | Freq: Four times a day (QID) | RECTAL | Status: DC | PRN

## 2015-12-06 NOTE — Progress Notes (Addendum)
Patient ID: Erik Shelton Shelton, male   DOB: 05-31-1941, 75 y.o.   MRN: [id number] [employer] [facility] [place] NAME: Erik Shelton Shelton    MR#:  [id number]  DATE OF BIRTH:  Dec 05, 1940  SUBJECTIVE:  Came in with recurrent diarrhea and abd pain  REVIEW OF SYSTEMS:   Review of Systems  Constitutional: Negative for fever, chills and weight loss.  HENT: Negative for ear discharge, ear pain and nosebleeds.   Eyes: Negative for blurred vision, pain and discharge.  Respiratory: Negative for sputum production, shortness of breath, wheezing and stridor.   Cardiovascular: Negative for chest pain, palpitations, orthopnea and PND.  Gastrointestinal: Positive for vomiting, abdominal pain and diarrhea. Negative for nausea.  Genitourinary: Negative for urgency and frequency.  Musculoskeletal: Negative for back pain and joint pain.  Neurological: Negative for sensory change, speech change, focal weakness and weakness.  Psychiatric/Behavioral: Negative for depression and hallucinations. The patient is not nervous/anxious.   All other systems reviewed and are negative.  Tolerating Diet:npo Tolerating PT: Not needed  DRUG ALLERGIES:  No Known Allergies  VITALS:  Blood pressure 115/67, pulse 112, temperature 99 F (37.2 C), temperature source Oral, resp. rate 24, height 5\' 11"  (1.803 m), weight 82.328 kg (181 lb 8 oz), SpO2 94 %.  PHYSICAL EXAMINATION:   Physical Exam  GENERAL:  75 y.o.-year-old patient lying in the bed with no acute distress.  EYES: Pupils equal, round, reactive to light and accommodation. No scleral icterus. Extraocular muscles intact.  HEENT: Head atraumatic, normocephalic. Oropharynx and nasopharynx clear.  NECK:  Supple, no jugular venous distention. No thyroid enlargement, no tenderness.  LUNGS: Normal breath sounds bilaterally, no wheezing, rales, rhonchi. No use of accessory muscles of respiration. Port present right upper  chest CARDIOVASCULAR: S1, S2 normal. No murmurs, rubs, or gallops.  ABDOMEN: Soft, mild tenderness , nondistended. Bowel sounds present. No organomegaly or mass.  EXTREMITIES: No cyanosis, clubbing or edema b/l.    NEUROLOGIC: Cranial nerves II through XII are intact. No focal Motor or sensory deficits b/l.  Overall feels weak PSYCHIATRIC:  patient is alert and oriented x 3.  SKIN: No obvious rash, lesion, or ulcer.   LABORATORY PANEL:  CBC  Recent Labs Lab 12/06/15 0522  WBC 8.5  HGB 8.0*  HCT 24.8*  PLT 21*    Chemistries   Recent Labs Lab 12/05/15 1754 12/06/15 0522  NA 127* 130*  K 3.6 3.2*  CL 95* 100*  CO2 24 26  GLUCOSE 137* 79  BUN 10 7  CREATININE 0.61 0.60*  CALCIUM 7.7* 7.3*  MG  --  1.3*  AST 30  --   ALT 45  --   ALKPHOS 106  --   BILITOT 0.7  --    Cardiac Enzymes No results for input(s): TROPONINI in the last 168 hours. RADIOLOGY:  Dg Chest 2 View  12/05/2015  CLINICAL DATA:  Weakness and diarrhea for 2 days. The patient is receiving chemotherapy for lymphoma. EXAM: CHEST  2 VIEW COMPARISON:  PA and lateral chest 11/22/2015.  CT chest 10/24/2015. FINDINGS: Port-A-Cath is in place. There is some mild right middle lobe atelectasis. Mediastinal and hilar lymphadenopathy is identified. No pneumothorax or pleural effusion. IMPRESSION: No acute abnormality. Lymphadenopathy without marked change since the prior plain films. Electronically Signed   By: Erik Shelton Shelton M.D.   On: 12/05/2015 19:19   Ct Abdomen Pelvis W Contrast  12/05/2015  CLINICAL DATA:  Generalized weakness and diffuse abdominal pain with  nausea and diarrhea for the past 2 days. EXAM: CT ABDOMEN AND PELVIS WITH CONTRAST TECHNIQUE: Multidetector CT imaging of the abdomen and pelvis was performed using the standard protocol following bolus administration of intravenous contrast. CONTRAST:  133mL OMNIPAQUE IOHEXOL 300 MG/ML  SOLN COMPARISON:  11/22/2015 FINDINGS: Lower chest: Bibasilar atelectasis  but no effusions or worrisome pulmonary lesions. Stable bilateral low-attenuation right infrahilar lymph nodes. Hepatobiliary: No focal hepatic lesions or intrahepatic biliary dilatation. The gallbladder is normal. No common bile duct dilatation. Pancreas: No mass, inflammation or ductal dilatation. Spleen: Normal size.  No focal lesions. Adrenals/Urinary Tract: The adrenal glands are normal in stable. Stable large bilateral renal cysts. No worrisome renal lesions or hydronephrosis. Stomach/Bowel: The stomach, duodenum and small bowel are unremarkable. No inflammatory changes, mass lesions or obstructive findings. The terminal ileum is normal. The appendix is normal. Slight worsening of diverticulitis involving the upper sigmoid colon. No complicating features such as abscess or free air. The remainder of the colon is unremarkable. Moderate stool throughout. Vascular/Lymphatic: No mesenteric or retroperitoneal mass or adenopathy. Small scattered lymph nodes are stable. The aorta and branch vessels are patent. The major venous structures are patent. Other: No pelvic mass or adenopathy. No free pelvic fluid collections. The bladder appears normal. The prostate gland and seminal vesicles are unremarkable. Stable prominent bilateral inguinal rings containing fat. Stable scattered inguinal lymph nodes. Musculoskeletal: No significant bony findings. IMPRESSION: 1. Progressive findings of acute diverticulitis involving the upper sigmoid colon. No abscess or free air. 2. Stable bilateral renal cysts and very prominent para renal/retroperitoneal fat. Electronically Signed   By: Erik Shelton Shelton M.D.   On: 12/05/2015 21:37   ASSESSMENT AND PLAN:  Erik Shelton Shelton is a 75 y.o. male who presents with Abdominal pain. Patient is a cancer patient on chemotherapy, was recently diagnosed with diverticulitis and treated with antibiotics and outpatient setting. He began to feel better and finished his course of antibiotics. A couple  of days after finishing the antibiotics he began having diarrhea and abdominal pain again.  1. Acute recurrent Diverticulitis  -IV Cipro and Flagyl -  C. difficile negative.  -GI stool PCR cultures negative -CLD -IVF  2. 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  3. Thrombocytopenic likely from the same. Otherwise stable, we'll continue to follow for now, do not feel the need for oncology consult at this time.  4. HTN (hypertension) - continue home meds   5.HLD (hyperlipidemia) - continue home meds  6. GERD (gastroesophageal reflux disease) - home dose PPI  I spoke with family at length. Patient continues to have significant issues will have GI involved  Case discussed with Care Management/Social Worker. Management plans discussed with the patient, family and they are in agreement.  CODE STATUS: Full  DVT Prophylaxis: Lovenox  TOTAL TIME TAKING CARE OF THIS PATIENT: 30 minutes.  >50% time spent on counselling and coordination of care  POSSIBLE D/C IN 1-2 DAYS, DEPENDING ON CLINICAL CONDITION.  Note: This dictation was prepared with Dragon dictation along with smaller phrase technology. Any transcriptional errors that result from this process are unintentional.  Carlyne Keehan M.D on 12/06/2015 at 2:11 PM  Between 7am to 6pm - Pager - [phone]  After 6pm go to [email or web address] - password [email or web address]  Office  [phone]  CC: Primary care physician; [name]., MD

## 2015-12-06 NOTE — Progress Notes (Signed)
Initial Nutrition Assessment  DOCUMENTATION CODES:   Severe malnutrition in context of acute illness/injury  INTERVENTION:   Coordination of Care: await diet progression as medically able. Will send plastic utensils. Medical Food Supplement Therapy: will recommend Boost Breeze po TID, each supplement provides 250 kcal and 9 grams of protein, at room temperature per request   NUTRITION DIAGNOSIS:   Malnutrition related to acute illness, cancer and cancer related treatments as evidenced by percent weight loss, energy intake < or equal to 50% for > or equal to 5 days, moderate depletions of muscle mass.  GOAL:   Patient will meet greater than or equal to 90% of their needs  MONITOR:   PO intake, Supplement acceptance, Diet advancement, Labs, Weight trends, I & O's  REASON FOR ASSESSMENT:   Consult Assessment of nutrition requirement/status  ASSESSMENT:    Pt admitted with recurrent diverticulitis with h/o Tcell lymphoma on chemotherapy.  Past Medical History  Diagnosis Date  . Stroke ([place])   . Seizures ([place])   . Hypercholesteremia   . HTN (hypertension)   . Cancer ([place])     T-Cell Lymphoma  . Seizures ([place])      Diet Order:  Diet clear liquid Room service appropriate?: Yes; Fluid consistency:: Thin    Current Nutrition: Pt tolerated some broth and jello this am per wife.   Food/Nutrition-Related History: Pt's wife reports pt has had little to nothing to eat since this past Sunday. Prior to Sunday since his last chemotherapy, 3 weeks ago, wife reports pt has been eating very little at meal times with a decreased appetite. Pt wife reports pt would sometimes eat soft eggs for breakfast, and then beans and rice later if he tolerated. Pt wife reports pt did try to drink Boost at home. Wife reports pt tolerates only foods at room temperature and prefers only plastic silverware.    Scheduled Medications:  . aspirin EC  325 mg Oral Daily  . ciprofloxacin  400 mg  Intravenous Q12H  . feeding supplement  1 Container Oral TID WC  . losartan  50 mg Oral Daily  . magnesium sulfate 1 - 4 g bolus IVPB  4 g Intravenous Once  . metronidazole  500 mg Intravenous Q8H  . pantoprazole  40 mg Oral Daily  . potassium chloride  10 mEq Intravenous Q1 Hr x 4  . pravastatin  80 mg Oral Daily     Electrolyte/Renal Profile and Glucose Profile:   Recent Labs Lab 12/05/15 1754 12/06/15 0522  NA 127* 130*  K 3.6 3.2*  CL 95* 100*  CO2 24 26  BUN 10 7  CREATININE 0.61 0.60*  CALCIUM 7.7* 7.3*  MG  --  1.3*  GLUCOSE 137* 79   Protein Profile:  Recent Labs Lab 12/05/15 1754  ALBUMIN 2.4*    Gastrointestinal Profile: Last BM:  12/06/2015   Nutrition-Focused Physical Exam Findings: Nutrition-Focused physical exam completed. Findings are no fat depletion, mild-moderate muscle depletion, and no edema.     Weight Change: Pt's wife reports weight os 189lbs last week at [facility]. Per [facility] weight of 196lbs on 10/25/2015 (4% in one week and 7% weight loss in 1.5 months)   Height:   Ht Readings from Last 1 Encounters:  12/05/15 5\' 11"  (1.803 m)    Weight:   Wt Readings from Last 1 Encounters:  12/06/15 181 lb 8 oz (82.328 kg)   Wt Readings from Last 10 Encounters:  12/06/15 181 lb 8 oz (82.328 kg)  11/29/15 188 lb 7.9 oz (85.5 kg)  11/22/15 189 lb (85.73 kg)  11/15/15 190 lb 11.2 oz (86.5 kg)  10/25/15 196 lb 13.9 oz (89.3 kg)  10/24/15 195 lb (88.451 kg)  09/27/15 195 lb 1.7 oz (88.5 kg)  09/06/15 193 lb 9 oz (87.8 kg)  08/25/15 202 lb 11.2 oz (91.944 kg)  08/14/15 191 lb 5.8 oz (86.8 kg)     BMI:  Body mass index is 25.33 kg/(m^2).  Estimated Nutritional Needs:   Kcal:  BEE: 1577kcals, TEE: (IF 1.1-1.3)(AF 1.2) [id number]  Protein:  66-90g protein (0.8-1.0g/kg)  Fluid:  2050-2456mL of fluid (25-65mL/kg)  EDUCATION NEEDS:   No education needs identified at this time   [place], RD, LDN Pager  [phone] Weekend/On-Call Pager [phone]

## 2015-12-06 NOTE — Progress Notes (Signed)
Patient ID: Erik Shelton, male   DOB: Feb 07, 1941, 75 y.o.   MRN: [id number] Runs of NSVT. Pt asymptomatic. Potassium is 3.2. Check stat magnesium check pharmacy to replace IV potassium. He has a port which could be used.

## 2015-12-06 NOTE — Progress Notes (Signed)
ELECTROLYTE  CONSULT NOTE - INITIAL  Pharmacy Consult for electrolyte monitoring and management   No Known Allergies  Patient Measurements: Height: 5\' 11"  (180.3 cm) Weight: 181 lb 8 oz (82.328 kg) IBW/kg (Calculated) : 75.3  Vital Signs: Temp: 99.6 F (37.6 C) (03/22 0845) Temp Source: Oral (03/22 0845) BP: 140/72 mmHg (03/22 0845) Pulse Rate: 116 (03/22 0845) Intake/Output from previous day: 03/21 0701 - 03/22 0700 In: -  Out: 400 [Urine:400] Intake/Output from this shift: Total I/O In: 480 [P.O.:480] Out: 150 [Urine:150]  Labs:  Recent Labs  12/05/15 1754 12/06/15 0522  WBC 12.0* 8.5  HGB 9.9* 8.0*  HCT 30.7* 24.8*  PLT 32* 21*     Recent Labs  12/05/15 1754 12/06/15 0522  NA 127* 130*  K 3.6 3.2*  CL 95* 100*  CO2 24 26  GLUCOSE 137* 79  BUN 10 7  CREATININE 0.61 0.60*  CALCIUM 7.7* 7.3*  MG  --  1.3*  PROT 5.5*  --   ALBUMIN 2.4*  --   AST 30  --   ALT 45  --   ALKPHOS 106  --   BILITOT 0.7  --    Estimated Creatinine Clearance: 86.3 mL/min (by C-G formula based on Cr of 0.6).    Assessment: 75 yo male with PMH of cancer (currenlty receiving chemotherapy) and diverticulitis, admitted with diarrhea and abdominal pain.    Patient on IV antibiotic for persistent diverticulitis.   This morning patient is have runs of NSVT. MD consulted pharmacy for electrolyte monitoring and management.  K: 3.2 Mg: 3.2 Na:130  Plan:  Will supplement with KCL 32mEg IV x 4 for a total of 40 mEq.  Will also supplement with Mag sulfate IV 4g x1.  Follow up labs ordered for AM.  Pharmcy will continue to monitor and adjust as needed.   Nancy Fetter, PharmD Pharmacy Resident  12/06/2015,10:51 AM

## 2015-12-06 NOTE — Progress Notes (Signed)
Pharmacy Antibiotic Note  Erik Shelton is a 75 y.o. male admitted on 12/05/2015 with diverticulitis.  Pharmacy has been consulted for cipro dosing.  Plan: Ciprofloxacin 400 mg IV q 12 hours ordered.  Height: 5\' 11"  (180.3 cm) Weight: 181 lb 8 oz (82.328 kg) IBW/kg (Calculated) : 75.3  Temp (24hrs), Avg:99.4 F (37.4 C), Min:98.7 F (37.1 C), Max:100.3 F (37.9 C)   Recent Labs Lab 11/29/15 0828 12/05/15 1754  WBC 18.5* 12.0*  CREATININE 0.68 0.61    Estimated Creatinine Clearance: 86.3 mL/min (by C-G formula based on Cr of 0.61).    No Known Allergies  Antimicrobials this admission: Cipro Flagyl >>    >>   Dose adjustments this admission:   Microbiology results:   3/22 UA: (-)  Thank you for allowing pharmacy to be a part of this patient's care.  Keyanah Kozicki S 12/06/2015 2:16 AM

## 2015-12-06 NOTE — Progress Notes (Signed)
Called Dr Judeen Hammans, HR  Up to 146 for a very short period with exertion than back to around 118- 122, per MD order Metoprolol 25 mg BID to start now

## 2015-12-06 NOTE — H&P (Signed)
[facility] [place] NAME: Erik Shelton    MR#:  [id number]  DATE OF BIRTH:  July 23, 1941  DATE OF ADMISSION:  12/05/2015  PRIMARY CARE PHYSICIAN: [name]., MD   REQUESTING/REFERRING PHYSICIAN: Kerman Passey, MD  CHIEF COMPLAINT:   Chief Complaint  Patient presents with  . Weakness  . Diarrhea  . Nausea    HISTORY OF PRESENT ILLNESS:  Erik Shelton  is a 75 y.o. male who presents with Abdominal pain. Patient is a cancer patient on chemotherapy, was recently diagnosed with diverticulitis and treated with antibiotics and outpatient setting. He began to feel better and finished his course of antibiotics. A couple of days after finishing the antibiotics he began having diarrhea and abdominal pain again. He came in to the ED for evaluation, and on CT scan here was found to have persistent diverticulitis. White blood cell count is elevated. Hospitalists were called for admission for diverticulitis requiring IV antibiotics due to failed outpatient treatment.  PAST MEDICAL HISTORY:   Past Medical History  Diagnosis Date  . Stroke ([place])   . Seizures ([place])   . Hypercholesteremia   . HTN (hypertension)   . Cancer ([place])     T-Cell Lymphoma  . Seizures ([place])     PAST SURGICAL HISTORY:   Past Surgical History  Procedure Laterality Date  . Peripheral vascular catheterization N/A 04/24/2015    Procedure: Glori Luis Cath Insertion;  Surgeon: Algernon Huxley, MD;  Location: [facility];  Service: Cardiovascular;  Laterality: N/A;  . Portacath placement      SOCIAL HISTORY:   Social History  Substance Use Topics  . Smoking status: Former Research scientist (life sciences)  . Smokeless tobacco: Not on file     Comment: Quit  in 1990  . Alcohol Use: Yes     Comment: Occassional glass of wine    FAMILY HISTORY:  No family history on file.  DRUG ALLERGIES:  No Known Allergies  MEDICATIONS AT HOME:   Prior to Admission medications    Medication Sig Start Date End Date Taking? Authorizing Provider  acetaminophen (TYLENOL) 325 MG tablet Take 650 mg by mouth every 6 (six) hours as needed for mild pain, moderate pain, fever or headache.   Yes Historical Provider, MD  aspirin EC 325 MG tablet Take 325 mg by mouth daily.   Yes Historical Provider, MD  dexamethasone (DECADRON) 4 MG tablet Take 1 tablet (4 mg total) by mouth 2 (two) times daily with a meal. Until 11/22/15.  Then take 1 tablet daily x7 days, then 1 tablet every other day until finished 11/20/15  Yes Noreene Filbert, MD  lidocaine-prilocaine (EMLA) cream Apply 1 application topically as needed. 05/03/15  Yes Lloyd Huger, MD  losartan (COZAAR) 50 MG tablet Take 50 mg by mouth daily.   Yes Historical Provider, MD  metroNIDAZOLE (FLAGYL) 500 MG tablet Take 1 tablet (500 mg total) by mouth 3 (three) times daily. 11/22/15 12/06/15 Yes Nena Polio, MD  omeprazole (PRILOSEC) 40 MG capsule Take 1 capsule (40 mg total) by mouth daily. 11/23/15  Yes Lloyd Huger, MD  pravastatin (PRAVACHOL) 80 MG tablet Take 80 mg by mouth daily.   Yes Historical Provider, MD  Albuterol Sulfate 108 (90 Base) MCG/ACT AEPB Inhale 1 puff into the lungs every 6 (six) hours as needed. Patient not taking: Reported on 11/29/2015 09/28/15   Lloyd Huger, MD  ondansetron (ZOFRAN) 4 MG tablet Take 1 tablet (4 mg total)  by mouth every 8 (eight) hours as needed for nausea. Patient not taking: Reported on 11/29/2015 11/23/15   Lloyd Huger, MD  oxyCODONE-acetaminophen (ROXICET) 5-325 MG tablet Take 1 tablet by mouth every 4 (four) hours as needed for severe pain. Patient not taking: Reported on 11/29/2015 10/25/15   Lloyd Huger, MD    REVIEW OF SYSTEMS:  Review of Systems  Constitutional: Negative for fever, chills, weight loss and malaise/fatigue.  HENT: Negative for ear pain, hearing loss and tinnitus.   Eyes: Negative for blurred vision, double vision, pain and redness.  Respiratory:  Negative for cough, hemoptysis and shortness of breath.   Cardiovascular: Negative for chest pain, palpitations, orthopnea and leg swelling.  Gastrointestinal: Positive for nausea, abdominal pain and diarrhea. Negative for vomiting and constipation.  Genitourinary: Negative for dysuria, frequency and hematuria.  Musculoskeletal: Negative for back pain, joint pain and neck pain.  Skin:       No acne, rash, or lesions  Neurological: Negative for dizziness, tremors, focal weakness and weakness.  Endo/Heme/Allergies: Negative for polydipsia. Does not bruise/bleed easily.  Psychiatric/Behavioral: Negative for depression. The patient is not nervous/anxious and does not have insomnia.      VITAL SIGNS:   Filed Vitals:   12/05/15 2230 12/05/15 2300 12/05/15 2330 12/06/15 0008  BP: 111/67 102/72 113/80 130/71  Pulse: 123   129  Temp:      TempSrc:      Resp: 21   23  Height:      Weight:      SpO2: 94%   98%   Wt Readings from Last 3 Encounters:  12/05/15 85.276 kg (188 lb)  11/29/15 85.5 kg (188 lb 7.9 oz)  11/22/15 85.73 kg (189 lb)    PHYSICAL EXAMINATION:  Physical Exam  Vitals reviewed. Constitutional: He is oriented to person, place, and time. He appears well-developed and well-nourished. No distress.  HENT:  Head: Normocephalic and atraumatic.  Mouth/Throat: Oropharynx is clear and moist.  Eyes: Conjunctivae and EOM are normal. Pupils are equal, round, and reactive to light. No scleral icterus.  Neck: Normal range of motion. Neck supple. No JVD present. No thyromegaly present.  Cardiovascular: Normal rate, regular rhythm and intact distal pulses.  Exam reveals no gallop and no friction rub.   No murmur heard. Respiratory: Effort normal and breath sounds normal. No respiratory distress. He has no wheezes. He has no rales.  GI: Soft. Bowel sounds are normal. He exhibits no distension. There is tenderness (left lower quadrant).  Musculoskeletal: Normal range of motion. He  exhibits no edema.  No arthritis, no gout  Lymphadenopathy:    He has no cervical adenopathy.  Neurological: He is alert and oriented to person, place, and time. No cranial nerve deficit.  No dysarthria, no aphasia  Skin: Skin is warm and dry. No rash noted. No erythema.  Psychiatric: He has a normal mood and affect. His behavior is normal. Judgment and thought content normal.    LABORATORY PANEL:   CBC  Recent Labs Lab 12/05/15 1754  WBC 12.0*  HGB 9.9*  HCT 30.7*  PLT 32*   ------------------------------------------------------------------------------------------------------------------  Chemistries   Recent Labs Lab 12/05/15 1754  NA 127*  K 3.6  CL 95*  CO2 24  GLUCOSE 137*  BUN 10  CREATININE 0.61  CALCIUM 7.7*  AST 30  ALT 45  ALKPHOS 106  BILITOT 0.7   ------------------------------------------------------------------------------------------------------------------  Cardiac Enzymes No results for input(s): TROPONINI in the last 168 hours. ------------------------------------------------------------------------------------------------------------------  RADIOLOGY:  Dg Chest 2 View  12/05/2015  CLINICAL DATA:  Weakness and diarrhea for 2 days. The patient is receiving chemotherapy for lymphoma. EXAM: CHEST  2 VIEW COMPARISON:  PA and lateral chest 11/22/2015.  CT chest 10/24/2015. FINDINGS: Port-A-Cath is in place. There is some mild right middle lobe atelectasis. Mediastinal and hilar lymphadenopathy is identified. No pneumothorax or pleural effusion. IMPRESSION: No acute abnormality. Lymphadenopathy without marked change since the prior plain films. Electronically Signed   By: Inge Rise M.D.   On: 12/05/2015 19:19   Ct Abdomen Pelvis W Contrast  12/05/2015  CLINICAL DATA:  Generalized weakness and diffuse abdominal pain with nausea and diarrhea for the past 2 days. EXAM: CT ABDOMEN AND PELVIS WITH CONTRAST TECHNIQUE: Multidetector CT imaging of the  abdomen and pelvis was performed using the standard protocol following bolus administration of intravenous contrast. CONTRAST:  124mL OMNIPAQUE IOHEXOL 300 MG/ML  SOLN COMPARISON:  11/22/2015 FINDINGS: Lower chest: Bibasilar atelectasis but no effusions or worrisome pulmonary lesions. Stable bilateral low-attenuation right infrahilar lymph nodes. Hepatobiliary: No focal hepatic lesions or intrahepatic biliary dilatation. The gallbladder is normal. No common bile duct dilatation. Pancreas: No mass, inflammation or ductal dilatation. Spleen: Normal size.  No focal lesions. Adrenals/Urinary Tract: The adrenal glands are normal in stable. Stable large bilateral renal cysts. No worrisome renal lesions or hydronephrosis. Stomach/Bowel: The stomach, duodenum and small bowel are unremarkable. No inflammatory changes, mass lesions or obstructive findings. The terminal ileum is normal. The appendix is normal. Slight worsening of diverticulitis involving the upper sigmoid colon. No complicating features such as abscess or free air. The remainder of the colon is unremarkable. Moderate stool throughout. Vascular/Lymphatic: No mesenteric or retroperitoneal mass or adenopathy. Small scattered lymph nodes are stable. The aorta and branch vessels are patent. The major venous structures are patent. Other: No pelvic mass or adenopathy. No free pelvic fluid collections. The bladder appears normal. The prostate gland and seminal vesicles are unremarkable. Stable prominent bilateral inguinal rings containing fat. Stable scattered inguinal lymph nodes. Musculoskeletal: No significant bony findings. IMPRESSION: 1. Progressive findings of acute diverticulitis involving the upper sigmoid colon. No abscess or free air. 2. Stable bilateral renal cysts and very prominent para renal/retroperitoneal fat. Electronically Signed   By: Marijo Sanes M.D.   On: 12/05/2015 21:37    EKG:   Orders placed or performed during the hospital encounter of  12/05/15  . EKG 12-Lead  . EKG 12-Lead  . EKG 12-Lead  . EKG 12-Lead    IMPRESSION AND PLAN:  Principal Problem:   Diverticulitis - given IV Cipro and Flagyl in the ED, we'll continue these on admission. He was C. difficile negative. Cultures were sent from the ED, follow up on these. 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 Thrombocytopenic likely from the same. Otherwise stable, we'll continue to follow for now, do not feel the need for oncology consult at this time.   HTN (hypertension) - continue home meds   HLD (hyperlipidemia) - continue home meds   GERD (gastroesophageal reflux disease) - home dose PPI  All the records are reviewed and case discussed with ED provider. Management plans discussed with the patient and/or family.  DVT PROPHYLAXIS: Mechanical only  GI PROPHYLAXIS: PPI  ADMISSION STATUS: Inpatient  CODE STATUS: Full Code Status History    Date Active Date Inactive Code Status Order ID Comments User Context   08/25/2015  1:58 AM 08/26/2015  3:39  PM Full Code [id number]  Fritzi Mandes, MD ED   08/04/2015  6:47 PM 08/07/2015  4:37 PM Full Code [id number]  Vaughan Basta, MD Inpatient   04/13/2015  4:46 PM 04/15/2015  6:45 PM Full Code [id number]  Lloyd Huger, MD Inpatient   04/13/2015  4:37 PM 04/13/2015  4:46 PM Full Code [id number]  Lloyd Huger, MD Outpatient      TOTAL TIME TAKING CARE OF THIS PATIENT: 40 minutes.    Alisabeth Selkirk [place] 12/06/2015, 12:17 AM  [facility]  Office  [phone]  CC: Primary care physician; [name]., MD

## 2015-12-06 NOTE — Progress Notes (Signed)
HR spiked into the 140s, 150s. MD notified, Metoprolol 5 mg IV ordered for heart rate. HR has improved at 105. Continue to assess.

## 2015-12-07 LAB — POTASSIUM: Potassium: 4 mmol/L (ref 3.5–5.1)

## 2015-12-07 LAB — MAGNESIUM: MAGNESIUM: 1.8 mg/dL (ref 1.7–2.4)

## 2015-12-07 MED ORDER — IPRATROPIUM-ALBUTEROL 0.5-2.5 (3) MG/3ML IN SOLN: 3.0000 mL | RESPIRATORY_TRACT | Status: DC | PRN

## 2015-12-07 MED ORDER — CIPROFLOXACIN HCL 500 MG PO TABS
500.0000 mg | ORAL_TABLET | Freq: Two times a day (BID) | ORAL | Status: DC
  Administered 2015-12-07 – 2015-12-08 (×2): 500 mg via ORAL
  Filled 2015-12-07 (×2): qty 1

## 2015-12-07 MED ORDER — METRONIDAZOLE 500 MG PO TABS
500.0000 mg | ORAL_TABLET | Freq: Three times a day (TID) | ORAL | Status: DC
  Administered 2015-12-07 – 2015-12-08 (×4): 500 mg via ORAL
  Filled 2015-12-07 (×4): qty 1

## 2015-12-07 MED ORDER — RISAQUAD PO CAPS
1.0000 | ORAL_CAPSULE | Freq: Two times a day (BID) | ORAL | Status: DC
  Administered 2015-12-07 – 2015-12-08 (×3): 1 via ORAL
  Filled 2015-12-07 (×3): qty 1

## 2015-12-07 NOTE — Care Management Important Message (Signed)
Important Message  Patient Details  Name: Erik Shelton MRN: [id number] Date of Birth: 1940-12-14   Medicare Important Message Given:  Yes    Juliann Pulse A Zakhi Dupre 12/07/2015, 11:35 AM

## 2015-12-07 NOTE — Progress Notes (Signed)
Patient ID: Erik Shelton, male   DOB: 11-11-1940, 74 y.o.   MRN: [id number] [place] [place] NAME: Erik Shelton    MR#:  [id number]  DATE OF BIRTH:  09-19-1940  SUBJECTIVE:  Came in with recurrent diarrhea and abd pain Pt feels a whole lot better. No abd pain today  REVIEW OF SYSTEMS:   Review of Systems  Constitutional: Negative for fever, chills and weight loss.  HENT: Negative for ear discharge, ear pain and nosebleeds.   Eyes: Negative for blurred vision, pain and discharge.  Respiratory: Negative for sputum production, shortness of breath, wheezing and stridor.   Cardiovascular: Negative for chest pain, palpitations, orthopnea and PND.  Gastrointestinal: Positive for vomiting, abdominal pain and diarrhea. Negative for nausea.  Genitourinary: Negative for urgency and frequency.  Musculoskeletal: Negative for back pain and joint pain.  Neurological: Negative for sensory change, speech change, focal weakness and weakness.  Psychiatric/Behavioral: Negative for depression and hallucinations. The patient is not nervous/anxious.   All other systems reviewed and are negative.  Tolerating Diet:FLD Tolerating PT: Not needed  DRUG ALLERGIES:  No Known Allergies  VITALS:  Blood pressure 109/61, pulse 100, temperature 98.1 F (36.7 C), temperature source Oral, resp. rate 20, height 5\' 11"  (1.803 m), weight 82.328 kg (181 lb 8 oz), SpO2 100 %.  PHYSICAL EXAMINATION:   Physical Exam  GENERAL:  75 y.o.-year-old patient lying in the bed with no acute distress.  EYES: Pupils equal, round, reactive to light and accommodation. No scleral icterus. Extraocular muscles intact.  HEENT: Head atraumatic, normocephalic. Oropharynx and nasopharynx clear.  NECK:  Supple, no jugular venous distention. No thyroid enlargement, no tenderness.  LUNGS: Normal breath sounds bilaterally, no wheezing, rales, rhonchi. No use of accessory muscles of  respiration. Port present right upper chest CARDIOVASCULAR: S1, S2 normal. No murmurs, rubs, or gallops.  ABDOMEN: Soft, mild tenderness , nondistended. Bowel sounds present. No organomegaly or mass.  EXTREMITIES: No cyanosis, clubbing or edema b/l.    NEUROLOGIC: Cranial nerves II through XII are intact. No focal Motor or sensory deficits b/l.  Overall feels weak PSYCHIATRIC:  patient is alert and oriented x 3.  SKIN: No obvious rash, lesion, or ulcer.   LABORATORY PANEL:  CBC  Recent Labs Lab 12/06/15 0522  WBC 8.5  HGB 8.0*  HCT 24.8*  PLT 21*    Chemistries   Recent Labs Lab 12/05/15 1754  12/06/15 0522 12/07/15 0558  NA 127*  --  130*  --   K 3.6  --  3.2* 4.0  CL 95*  --  100*  --   CO2 24  --  26  --   GLUCOSE 137*  --  79  --   BUN 10  --  7  --   CREATININE 0.61  --  0.60*  --   CALCIUM 7.7*  --  7.3*  --   MG  --   < > 1.3* 1.8  AST 30  --   --   --   ALT 45  --   --   --   ALKPHOS 106  --   --   --   BILITOT 0.7  --   --   --   < > = values in this interval not displayed. Cardiac Enzymes No results for input(s): TROPONINI in the last 168 hours. RADIOLOGY:  Dg Chest 2 View  12/05/2015  CLINICAL DATA:  Weakness and diarrhea for 2  days. The patient is receiving chemotherapy for lymphoma. EXAM: CHEST  2 VIEW COMPARISON:  PA and lateral chest 11/22/2015.  CT chest 10/24/2015. FINDINGS: Port-A-Cath is in place. There is some mild right middle lobe atelectasis. Mediastinal and hilar lymphadenopathy is identified. No pneumothorax or pleural effusion. IMPRESSION: No acute abnormality. Lymphadenopathy without marked change since the prior plain films. Electronically Signed   By: Inge Rise M.D.   On: 12/05/2015 19:19   Ct Abdomen Pelvis W Contrast  12/05/2015  CLINICAL DATA:  Generalized weakness and diffuse abdominal pain with nausea and diarrhea for the past 2 days. EXAM: CT ABDOMEN AND PELVIS WITH CONTRAST TECHNIQUE: Multidetector CT imaging of the abdomen  and pelvis was performed using the standard protocol following bolus administration of intravenous contrast. CONTRAST:  167mL OMNIPAQUE IOHEXOL 300 MG/ML  SOLN COMPARISON:  11/22/2015 FINDINGS: Lower chest: Bibasilar atelectasis but no effusions or worrisome pulmonary lesions. Stable bilateral low-attenuation right infrahilar lymph nodes. Hepatobiliary: No focal hepatic lesions or intrahepatic biliary dilatation. The gallbladder is normal. No common bile duct dilatation. Pancreas: No mass, inflammation or ductal dilatation. Spleen: Normal size.  No focal lesions. Adrenals/Urinary Tract: The adrenal glands are normal in stable. Stable large bilateral renal cysts. No worrisome renal lesions or hydronephrosis. Stomach/Bowel: The stomach, duodenum and small bowel are unremarkable. No inflammatory changes, mass lesions or obstructive findings. The terminal ileum is normal. The appendix is normal. Slight worsening of diverticulitis involving the upper sigmoid colon. No complicating features such as abscess or free air. The remainder of the colon is unremarkable. Moderate stool throughout. Vascular/Lymphatic: No mesenteric or retroperitoneal mass or adenopathy. Small scattered lymph nodes are stable. The aorta and branch vessels are patent. The major venous structures are patent. Other: No pelvic mass or adenopathy. No free pelvic fluid collections. The bladder appears normal. The prostate gland and seminal vesicles are unremarkable. Stable prominent bilateral inguinal rings containing fat. Stable scattered inguinal lymph nodes. Musculoskeletal: No significant bony findings. IMPRESSION: 1. Progressive findings of acute diverticulitis involving the upper sigmoid colon. No abscess or free air. 2. Stable bilateral renal cysts and very prominent para renal/retroperitoneal fat. Electronically Signed   By: Marijo Sanes M.D.   On: 12/05/2015 21:37   ASSESSMENT AND PLAN:  Erik Shelton is a 75 y.o. male who presents with  Abdominal pain. Patient is a cancer patient on chemotherapy, was recently diagnosed with diverticulitis and treated with antibiotics and outpatient setting. He began to feel better and finished his course of antibiotics. A couple of days after finishing the antibiotics he began having diarrhea and abdominal pain again.  1. Acute recurrent Diverticulitis  -IV Cipro and Flagyl---changed to by mouth Cipro and Flagyl. -  C. difficile negative.  -GI stool PCR cultures negative -CLD--he lives with full liquid diet. Patient tolerating it well. He was admitted to soft in the morning.  2. 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  3. Thrombocytopenic likely from the same. Otherwise stable, we'll continue to follow for now, do not feel the need for oncology consult at this time.  4. HTN (hypertension) - continue home meds   5.HLD (hyperlipidemia) - continue home meds  6. GERD (gastroesophageal reflux disease) - home dose PPI  If remains stable will discharge in the morning.   Case discussed with Care Management/Social Worker. Management plans discussed with the patient, family and they are in agreement.  CODE STATUS: Full  DVT Prophylaxis: Lovenox  TOTAL TIME TAKING CARE  OF THIS PATIENT: 30 minutes.  >50% time spent on counselling and coordination of care  POSSIBLE D/C IN 1-2 DAYS, DEPENDING ON CLINICAL CONDITION.  Note: This dictation was prepared with Dragon dictation along with smaller phrase technology. Any transcriptional errors that result from this process are unintentional.  Scotti Motter M.D on 12/07/2015 at 3:33 PM  Between 7am to 6pm - Pager - [phone]  After 6pm go to [email or web address] - password [email or web address] [facility]  Office  [phone]  CC: Primary care physician; [name]., MD

## 2015-12-07 NOTE — Progress Notes (Signed)
ELECTROLYTE  CONSULT NOTE - Follow Up  Pharmacy Consult for electrolyte monitoring and management   No Known Allergies  Patient Measurements: Height: 5\' 11"  (180.3 cm) Weight: 181 lb 8 oz (82.328 kg) IBW/kg (Calculated) : 75.3  Vital Signs: Temp: 97.4 F (36.3 C) (03/23 0517) Temp Source: Oral (03/23 0517) BP: 123/74 mmHg (03/23 0517) Pulse Rate: 92 (03/23 0517) Intake/Output from previous day: 03/22 0701 - 03/23 0700 In: 1080 [P.O.:1080] Out: 2000 [Urine:2000] Intake/Output from this shift:   Labs:  Recent Labs  12/05/15 1754 12/06/15 0522  WBC 12.0* 8.5  HGB 9.9* 8.0*  HCT 30.7* 24.8*  PLT 32* 21*     Recent Labs  12/05/15 1754 12/06/15 0522 12/07/15 0558  NA 127* 130*  --   K 3.6 3.2* 4.0  CL 95* 100*  --   CO2 24 26  --   GLUCOSE 137* 79  --   BUN 10 7  --   CREATININE 0.61 0.60*  --   CALCIUM 7.7* 7.3*  --   MG  --  1.3* 1.8  PROT 5.5*  --   --   ALBUMIN 2.4*  --   --   AST 30  --   --   ALT 45  --   --   ALKPHOS 106  --   --   BILITOT 0.7  --   --    Estimated Creatinine Clearance: 86.3 mL/min (by C-G formula based on Cr of 0.6).    Assessment: 75 yo male with PMH of cancer (currenlty receiving chemotherapy) and diverticulitis, admitted with diarrhea and abdominal pain.    Patient on IV antibiotic for persistent diverticulitis.   Yesterday  morning patient had runs of NSVT. MD consulted pharmacy for electrolyte monitoring and management.  3/22:  K: 3.2    Mg: 3.2    Na:130  3/23: K: 4.0      Mg: 1.8       Plan:  Patients K+ and Mg+ levels now WNL.  No supplementation needed at this time.  BMP ordered with AM labs.  Pharmcy will continue to monitor and adjust as needed per consult.    Nancy Fetter, PharmD Pharmacy Resident  12/07/2015,7:05 AM

## 2015-12-08 LAB — BASIC METABOLIC PANEL
Anion gap: 5 (ref 5–15)
BUN: 6 mg/dL (ref 6–20)
CALCIUM: 8 mg/dL — AB (ref 8.9–10.3)
CO2: 27 mmol/L (ref 22–32)
CREATININE: 0.54 mg/dL — AB (ref 0.61–1.24)
Chloride: 104 mmol/L (ref 101–111)
GFR calc Af Amer: >60 mL/min (ref >60)
Glucose, Bld: 107 mg/dL — ABNORMAL HIGH (ref 65–99)
POTASSIUM: 3.2 mmol/L — AB (ref 3.5–5.1)
SODIUM: 136 mmol/L (ref 135–145)

## 2015-12-08 LAB — MAGNESIUM: MAGNESIUM: 1.6 mg/dL — AB (ref 1.7–2.4)

## 2015-12-08 MED ORDER — HEPARIN SOD (PORK) LOCK FLUSH 100 UNIT/ML IV SOLN
500.0000 [IU] | Freq: Once | INTRAVENOUS | Status: AC
  Administered 2015-12-08: 500 [IU] via INTRAVENOUS
  Filled 2015-12-08: qty 5

## 2015-12-08 MED ORDER — POTASSIUM CHLORIDE CRYS ER 20 MEQ PO TBCR
40.0000 meq | EXTENDED_RELEASE_TABLET | Freq: Once | ORAL | Status: AC
  Administered 2015-12-08: 11:00:00 40 meq via ORAL
  Filled 2015-12-08: qty 2

## 2015-12-08 MED ORDER — BOOST / RESOURCE BREEZE PO LIQD: 1.0000 | Freq: Three times a day (TID) | ORAL | Status: DC

## 2015-12-08 MED ORDER — MAGNESIUM SULFATE 4 GM/100ML IV SOLN
4.0000 g | Freq: Once | INTRAVENOUS | Status: AC
  Administered 2015-12-08: 4 g via INTRAVENOUS
  Filled 2015-12-08: qty 100

## 2015-12-08 MED ORDER — RISAQUAD PO CAPS: 1.0000 | ORAL_CAPSULE | Freq: Two times a day (BID) | ORAL | Status: AC

## 2015-12-08 MED ORDER — HYDROCODONE-ACETAMINOPHEN 5-325 MG PO TABS: 1.0000 | ORAL_TABLET | ORAL | Status: DC | PRN

## 2015-12-08 MED ORDER — METRONIDAZOLE 500 MG PO TABS
500.0000 mg | ORAL_TABLET | Freq: Three times a day (TID) | ORAL | Status: DC
Start: 2015-12-08 — End: 2015-12-18

## 2015-12-08 MED ORDER — CIPROFLOXACIN HCL 500 MG PO TABS: 500.0000 mg | ORAL_TABLET | Freq: Two times a day (BID) | ORAL | Status: DC

## 2015-12-08 NOTE — Progress Notes (Signed)
No c/o pain this shift, VSS, received MD order to discharg atient to home, flushed ad de assessed port.  Reviewed discharge instructions home meds and prescriptions with patient and wife and both verbalized understanding discharge to home with wife and nursing in wheelchair

## 2015-12-08 NOTE — Progress Notes (Signed)
Pharmacy Antibiotic Note  Erik Shelton is a 75 y.o. male admitted on 12/05/2015 with diverticulitis.  Pharmacy was consulted for cipro dosing.  Plan: Will continue Ciprofloxacin 500mg  PO BID for intra-abdominal infection.   Height: 5\' 11"  (180.3 cm) Weight: 181 lb 8 oz (82.328 kg) IBW/kg (Calculated) : 75.3  Temp (24hrs), Avg:98.4 F (36.9 C), Min:98.1 F (36.7 C), Max:98.8 F (37.1 C)   Recent Labs Lab 12/05/15 1754 12/06/15 0522 12/08/15 0519  WBC 12.0* 8.5  --   CREATININE 0.61 0.60* 0.54*    Estimated Creatinine Clearance: 86.3 mL/min (by C-G formula based on Cr of 0.54).    No Known Allergies  Antimicrobials this admission: 3/22 Cipro >> 3/22 Flagyl >>     Microbiology results:  3/21: GI PCR, stool: negative 3/21: C.Diff negative    Thank you for allowing pharmacy to be a part of this patient's care.  Nancy Fetter, PharmD Pharmacy Resident  12/08/2015 8:47 AM

## 2015-12-08 NOTE — Progress Notes (Signed)
ELECTROLYTE  CONSULT NOTE - Follow Up  Pharmacy Consult for electrolyte monitoring and management   No Known Allergies  Patient Measurements: Height: 5\' 11"  (180.3 cm) Weight: 181 lb 8 oz (82.328 kg) IBW/kg (Calculated) : 75.3  Vital Signs: Temp: 98.8 F (37.1 C) (03/24 0456) Temp Source: Oral (03/24 0456) BP: 118/65 mmHg (03/24 0456) Pulse Rate: 98 (03/24 0456) Intake/Output from previous day: 03/23 0701 - 03/24 0700 In: 1140 [P.O.:1140] Out: 700 [Urine:700] Intake/Output from this shift: Total I/O In: -  Out: 210 [Urine:210] Labs:  Recent Labs  12/05/15 1754 12/06/15 0522  WBC 12.0* 8.5  HGB 9.9* 8.0*  HCT 30.7* 24.8*  PLT 32* 21*     Recent Labs  12/05/15 1754 12/06/15 0522 12/07/15 0558 12/08/15 0519  NA 127* 130*  --  136  K 3.6 3.2* 4.0 3.2*  CL 95* 100*  --  104  CO2 24 26  --  27  GLUCOSE 137* 79  --  107*  BUN 10 7  --  6  CREATININE 0.61 0.60*  --  0.54*  CALCIUM 7.7* 7.3*  --  8.0*  MG  --  1.3* 1.8  --   PROT 5.5*  --   --   --   ALBUMIN 2.4*  --   --   --   AST 30  --   --   --   ALT 45  --   --   --   ALKPHOS 106  --   --   --   BILITOT 0.7  --   --   --    Estimated Creatinine Clearance: 86.3 mL/min (by C-G formula based on Cr of 0.54).    Assessment: 75 yo male with PMH of cancer (currenlty receiving chemotherapy) and diverticulitis, admitted with diarrhea and abdominal pain.    Patient on IV antibiotic for persistent diverticulitis.   Yesterday  morning patient had runs of NSVT. MD consulted pharmacy for electrolyte monitoring and management.  3/22:  K: 3.2    Mg: 3.2    Na:130  3/23: K: 4.0      Mg: 1.8       3/24: K: 3.2      Mg: pending  Plan:  Will supplement potassium with KCl 40 mEq po x 1.  Magnesium level pending.  Will order BMP/magnesium in AM.   Murrell Converse, PharmD Clinical Pharmacist 12/08/2015

## 2015-12-08 NOTE — Discharge Summary (Signed)
[facility] NAME: Raysean Catinella    MR#:  [id number]  DATE OF BIRTH:  December 18, 1940  DATE OF ADMISSION:  12/05/2015 ADMITTING PHYSICIAN: Lance Coon, MD  DATE OF DISCHARGE: 12/08/15  PRIMARY CARE PHYSICIAN: [name]., MD    ADMISSION DIAGNOSIS:  Diverticulitis of large intestine without perforation or abscess without bleeding [K57.32]  DISCHARGE DIAGNOSIS:  Recurrent Diverticulitis hypokalemia  SECONDARY DIAGNOSIS:   Past Medical History  Diagnosis Date  . Stroke ([place])   . Seizures ([place])   . Hypercholesteremia   . HTN (hypertension)   . Cancer ([place])     T-Cell Lymphoma  . Seizures [facility])     HOSPITAL COURSE:  Elba Mcfeaters is a 75 y.o. male who presents with Abdominal pain. Patient is a cancer patient on chemotherapy, was recently diagnosed with diverticulitis and treated with antibiotics and outpatient setting. He began to feel better and finished his course of antibiotics. A couple of days after finishing the antibiotics he began having diarrhea and abdominal pain again.  1. Acute recurrent Diverticulitis  -IV Cipro and Flagyl---changed to by mouth Cipro and Flagyl. - C. difficile negative.  -GI stool PCR cultures negative -CLD--he is tolerating  full liquid diet. Patient tolerating it well.  2. 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  3. Thrombocytopenic likely from the same. Otherwise stable, we'll continue to follow for now, do not feel the need for oncology consult at this time.  4. HTN (hypertension) - continue home meds   5.HLD (hyperlipidemia) - continue home meds  6. GERD (gastroesophageal reflux disease) - home dose PPI  Will d/c home after replacing K and mag CONSULTS OBTAINED:     DRUG ALLERGIES:  No Known Allergies  DISCHARGE MEDICATIONS:   Current Discharge Medication List    START taking these  medications   Details  acidophilus (RISAQUAD) CAPS capsule Take 1 capsule by mouth 2 (two) times daily. Qty: 20 capsule, Refills: 0    ciprofloxacin (CIPRO) 500 MG tablet Take 1 tablet (500 mg total) by mouth 2 (two) times daily. Qty: 17 tablet, Refills: 0    feeding supplement (BOOST / RESOURCE BREEZE) LIQD Take 1 Container by mouth 3 (three) times daily with meals. Qty: 90 Container, Refills: 0    HYDROcodone-acetaminophen (NORCO/VICODIN) 5-325 MG tablet Take 1-2 tablets by mouth every 4 (four) hours as needed for moderate pain. Qty: 30 tablet, Refills: 0      CONTINUE these medications which have CHANGED   Details  metroNIDAZOLE (FLAGYL) 500 MG tablet Take 1 tablet (500 mg total) by mouth every 8 (eight) hours. Qty: 14 tablet, Refills: 0      CONTINUE these medications which have NOT CHANGED   Details  acetaminophen (TYLENOL) 325 MG tablet Take 650 mg by mouth every 6 (six) hours as needed for mild pain, moderate pain, fever or headache.    aspirin EC 325 MG tablet Take 325 mg by mouth daily.    dexamethasone (DECADRON) 4 MG tablet Take 1 tablet (4 mg total) by mouth 2 (two) times daily with a meal. Until 11/22/15.  Then take 1 tablet daily x7 days, then 1 tablet every other day until finished Qty: 20 tablet, Refills: 0    lidocaine-prilocaine (EMLA) cream Apply 1 application topically as needed. Qty: 30 g, Refills: 2    losartan (COZAAR) 50 MG tablet Take 50 mg by mouth daily.    omeprazole (  PRILOSEC) 40 MG capsule Take 1 capsule (40 mg total) by mouth daily. Qty: 90 capsule, Refills: 2    pravastatin (PRAVACHOL) 80 MG tablet Take 80 mg by mouth daily.    Albuterol Sulfate 108 (90 Base) MCG/ACT AEPB Inhale 1 puff into the lungs every 6 (six) hours as needed. Qty: 1 each, Refills: 12    ondansetron (ZOFRAN) 4 MG tablet Take 1 tablet (4 mg total) by mouth every 8 (eight) hours as needed for nausea. Qty: 30 tablet, Refills: 1   Associated Diagnoses: Peripheral T cell  lymphoma of lymph nodes of multiple sites (HCC)    oxyCODONE-acetaminophen (ROXICET) 5-325 MG tablet Take 1 tablet by mouth every 4 (four) hours as needed for severe pain. Qty: 60 tablet, Refills: 0        If you experience worsening of your admission symptoms, develop shortness of breath, life threatening emergency, suicidal or homicidal thoughts you must seek medical attention immediately by calling 911 or calling your MD immediately  if symptoms less severe.  You Must read complete instructions/literature along with all the possible adverse reactions/side effects for all the Medicines you take and that have been prescribed to you. Take any new Medicines after you have completely understood and accept all the possible adverse reactions/side effects.   Please note  You were cared for by a hospitalist during your hospital stay. If you have any questions about your discharge medications or the care you received while you were in the hospital after you are discharged, you can call the unit and asked to speak with the hospitalist on call if the hospitalist that took care of you is not available. Once you are discharged, your primary care physician will handle any further medical issues. Please note that NO REFILLS for any discharge medications will be authorized once you are discharged, as it is imperative that you return to your primary care physician (or establish a relationship with a primary care physician if you do not have one) for your aftercare needs so that they can reassess your need for medications and monitor your lab values. Today   SUBJECTIVE   Doing well  VITAL SIGNS:  Blood pressure 118/65, pulse 98, temperature 98.8 F (37.1 C), temperature source Oral, resp. rate 24, height 5\' 11"  (1.803 m), weight 82.328 kg (181 lb 8 oz), SpO2 99 %.  I/O:   Intake/Output Summary (Last 24 hours) at 12/08/15 0915 Last data filed at 12/08/15 0743  Gross per 24 hour  Intake   1020 ml   Output    910 ml  Net    110 ml    PHYSICAL EXAMINATION:  GENERAL:  75 y.o.-year-old patient lying in the bed with no acute distress.  EYES: Pupils equal, round, reactive to light and accommodation. No scleral icterus. Extraocular muscles intact.  HEENT: Head atraumatic, normocephalic. Oropharynx and nasopharynx clear.  NECK:  Supple, no jugular venous distention. No thyroid enlargement, no tenderness.  LUNGS: Normal breath sounds bilaterally, no wheezing, rales,rhonchi or crepitation. No use of accessory muscles of respiration.  CARDIOVASCULAR: S1, S2 normal. No murmurs, rubs, or gallops.  ABDOMEN: Soft, non-tender, non-distended. Bowel sounds present. No organomegaly or mass.  EXTREMITIES: No pedal edema, cyanosis, or clubbing.  NEUROLOGIC: Cranial nerves II through XII are intact. Muscle strength 5/5 in all extremities. Sensation intact. Gait not checked.  PSYCHIATRIC: The patient is alert and oriented x 3.  SKIN: No obvious rash, lesion, or ulcer.   DATA REVIEW:   CBC   Recent  Labs Lab 12/06/15 0522  WBC 8.5  HGB 8.0*  HCT 24.8*  PLT 21*    Chemistries   Recent Labs Lab 12/05/15 1754  12/08/15 0519  NA 127*  < > 136  K 3.6  < > 3.2*  CL 95*  < > 104  CO2 24  < > 27  GLUCOSE 137*  < > 107*  BUN 10  < > 6  CREATININE 0.61  < > 0.54*  CALCIUM 7.7*  < > 8.0*  MG  --   < > 1.6*  AST 30  --   --   ALT 45  --   --   ALKPHOS 106  --   --   BILITOT 0.7  --   --   < > = values in this interval not displayed.  Microbiology Results   Recent Results (from the past 240 hour(s))  Gastrointestinal Panel by PCR , Stool     Status: None   Collection Time: 12/05/15  8:54 PM  Result Value Ref Range Status   Campylobacter species NOT DETECTED NOT DETECTED Final   Plesimonas shigelloides NOT DETECTED NOT DETECTED Final   Salmonella species NOT DETECTED NOT DETECTED Final   Yersinia enterocolitica NOT DETECTED NOT DETECTED Final   Vibrio species NOT DETECTED NOT DETECTED  Final   Vibrio cholerae NOT DETECTED NOT DETECTED Final   Enteroaggregative E coli (EAEC) NOT DETECTED NOT DETECTED Final   Enteropathogenic E coli (EPEC) NOT DETECTED NOT DETECTED Final   Enterotoxigenic E coli (ETEC) NOT DETECTED NOT DETECTED Final   Shiga like toxin producing E coli (STEC) NOT DETECTED NOT DETECTED Final   E. coli O157 NOT DETECTED NOT DETECTED Final   Shigella/Enteroinvasive E coli (EIEC) NOT DETECTED NOT DETECTED Final   Cryptosporidium NOT DETECTED NOT DETECTED Final   Cyclospora cayetanensis NOT DETECTED NOT DETECTED Final   Entamoeba histolytica NOT DETECTED NOT DETECTED Final   Giardia lamblia NOT DETECTED NOT DETECTED Final   Adenovirus F40/41 NOT DETECTED NOT DETECTED Final   Astrovirus NOT DETECTED NOT DETECTED Final   Norovirus GI/GII NOT DETECTED NOT DETECTED Final   Rotavirus A NOT DETECTED NOT DETECTED Final   Sapovirus (I, II, IV, and V) NOT DETECTED NOT DETECTED Final  C difficile quick scan w PCR reflex     Status: None   Collection Time: 12/05/15  8:54 PM  Result Value Ref Range Status   C Diff antigen NEGATIVE NEGATIVE Final   C Diff toxin NEGATIVE NEGATIVE Final   C Diff interpretation Negative for C. difficile  Final    RADIOLOGY:  No results found.   Management plans discussed with the patient, family and they are in agreement.  CODE STATUS:     Code Status Orders        Start     Ordered   12/06/15 0123  Full code   Continuous     12/06/15 0122    Code Status History    Date Active Date Inactive Code Status Order ID Comments User Context   08/25/2015  1:58 AM 08/26/2015  3:39 PM Full Code [id number]  Fritzi Mandes, MD ED   08/04/2015  6:47 PM 08/07/2015  4:37 PM Full Code [id number]  Vaughan Basta, MD Inpatient   04/13/2015  4:46 PM 04/15/2015  6:45 PM Full Code [id number]  Lloyd Huger, MD Inpatient   04/13/2015  4:37 PM 04/13/2015  4:46 PM Full Code [id number]  Lloyd Huger, MD Outpatient  TOTAL TIME TAKING  CARE OF THIS PATIENT:73minutes.    Latisa Belay M.D on 12/08/2015 at 9:15 AM  Between 7am to 6pm - Pager - [phone] After 6pm go to [email or web address] - password [email or web address] [employer] [facility]  Office  [phone]  CC: Primary care physician; [name]., MD

## 2015-12-08 NOTE — Progress Notes (Signed)
ELECTROLYTE  CONSULT NOTE - Follow Up  Pharmacy Consult for electrolyte monitoring and management   No Known Allergies  Patient Measurements: Height: 5\' 11"  (180.3 cm) Weight: 181 lb 8 oz (82.328 kg) IBW/kg (Calculated) : 75.3  Vital Signs: Temp: 98.8 F (37.1 C) (03/24 0456) Temp Source: Oral (03/24 0456) BP: 118/65 mmHg (03/24 0456) Pulse Rate: 98 (03/24 0456) Intake/Output from previous day: 03/23 0701 - 03/24 0700 In: 1140 [P.O.:1140] Out: 700 [Urine:700] Intake/Output from this shift: Total I/O In: -  Out: 210 [Urine:210] Labs:  Recent Labs  12/05/15 1754 12/06/15 0522  WBC 12.0* 8.5  HGB 9.9* 8.0*  HCT 30.7* 24.8*  PLT 32* 21*     Recent Labs  12/05/15 1754 12/06/15 0522 12/07/15 0558 12/08/15 0519  NA 127* 130*  --  136  K 3.6 3.2* 4.0 3.2*  CL 95* 100*  --  104  CO2 24 26  --  27  GLUCOSE 137* 79  --  107*  BUN 10 7  --  6  CREATININE 0.61 0.60*  --  0.54*  CALCIUM 7.7* 7.3*  --  8.0*  MG  --  1.3* 1.8 1.6*  PROT 5.5*  --   --   --   ALBUMIN 2.4*  --   --   --   AST 30  --   --   --   ALT 45  --   --   --   ALKPHOS 106  --   --   --   BILITOT 0.7  --   --   --    Estimated Creatinine Clearance: 86.3 mL/min (by C-G formula based on Cr of 0.54).    Assessment: 75 yo male with PMH of cancer (currenlty receiving chemotherapy) and diverticulitis, admitted with diarrhea and abdominal pain.    Patient on IV antibiotic for persistent diverticulitis.   Yesterday  morning patient had runs of NSVT. MD consulted pharmacy for electrolyte monitoring and management.  3/22:  K: 3.2    Mg: 3.2    Na:130  3/23: K: 4.0      Mg: 1.8       3/24: K: 3.2      Mg: 1.6  Plan:  Potassium was supplemented with KCl 40 mEq po x 1.  Will supplement Mag with Magnesium sulfate IVPB 4gm x1.  BMP/magnesium labs ordered for tomorrow AM @ 0500. Pharmacy will continue to follow per consult.    Nancy Fetter, PharmD Pharmacy Resident 12/08/2015

## 2015-12-13 ENCOUNTER — Other Ambulatory Visit: Payer: Medicare Other

## 2015-12-13 ENCOUNTER — Ambulatory Visit: Payer: Medicare Other | Admitting: Internal Medicine

## 2015-12-13 ENCOUNTER — Ambulatory Visit: Payer: Medicare Other

## 2015-12-14 ENCOUNTER — Inpatient Hospital Stay: Payer: Medicare Other

## 2015-12-14 ENCOUNTER — Inpatient Hospital Stay (HOSPITAL_BASED_OUTPATIENT_CLINIC_OR_DEPARTMENT_OTHER): Payer: Medicare Other | Admitting: Oncology

## 2015-12-14 ENCOUNTER — Ambulatory Visit: Payer: Self-pay | Admitting: Oncology

## 2015-12-14 VITALS — BP 135/80 | HR 125 | Temp 96.0°F | Resp 18 | Wt 175.9 lb

## 2015-12-14 DIAGNOSIS — E876 Hypokalemia: Secondary | ICD-10-CM | POA: Diagnosis not present

## 2015-12-14 DIAGNOSIS — Z7689 Persons encountering health services in other specified circumstances: Secondary | ICD-10-CM | POA: Diagnosis not present

## 2015-12-14 DIAGNOSIS — F329 Major depressive disorder, single episode, unspecified: Secondary | ICD-10-CM | POA: Diagnosis not present

## 2015-12-14 DIAGNOSIS — Z79899 Other long term (current) drug therapy: Secondary | ICD-10-CM | POA: Diagnosis not present

## 2015-12-14 DIAGNOSIS — E785 Hyperlipidemia, unspecified: Secondary | ICD-10-CM

## 2015-12-14 DIAGNOSIS — A044 Other intestinal Escherichia coli infections: Secondary | ICD-10-CM | POA: Diagnosis not present

## 2015-12-14 DIAGNOSIS — R062 Wheezing: Secondary | ICD-10-CM | POA: Diagnosis not present

## 2015-12-14 DIAGNOSIS — D72828 Other elevated white blood cell count: Secondary | ICD-10-CM

## 2015-12-14 DIAGNOSIS — T451X5S Adverse effect of antineoplastic and immunosuppressive drugs, sequela: Secondary | ICD-10-CM

## 2015-12-14 DIAGNOSIS — C8448 Peripheral T-cell lymphoma, not classified, lymph nodes of multiple sites: Secondary | ICD-10-CM | POA: Diagnosis not present

## 2015-12-14 DIAGNOSIS — R202 Paresthesia of skin: Secondary | ICD-10-CM | POA: Diagnosis not present

## 2015-12-14 DIAGNOSIS — E871 Hypo-osmolality and hyponatremia: Secondary | ICD-10-CM | POA: Diagnosis not present

## 2015-12-14 DIAGNOSIS — E7800 Pure hypercholesterolemia, unspecified: Secondary | ICD-10-CM | POA: Diagnosis not present

## 2015-12-14 DIAGNOSIS — R634 Abnormal weight loss: Secondary | ICD-10-CM | POA: Diagnosis not present

## 2015-12-14 DIAGNOSIS — R52 Pain, unspecified: Secondary | ICD-10-CM

## 2015-12-14 DIAGNOSIS — C8442 Peripheral T-cell lymphoma, not classified, intrathoracic lymph nodes: Secondary | ICD-10-CM

## 2015-12-14 DIAGNOSIS — Z5111 Encounter for antineoplastic chemotherapy: Secondary | ICD-10-CM | POA: Diagnosis not present

## 2015-12-14 DIAGNOSIS — G620 Drug-induced polyneuropathy: Secondary | ICD-10-CM

## 2015-12-14 DIAGNOSIS — R531 Weakness: Secondary | ICD-10-CM

## 2015-12-14 DIAGNOSIS — R600 Localized edema: Secondary | ICD-10-CM | POA: Diagnosis not present

## 2015-12-14 DIAGNOSIS — Z8673 Personal history of transient ischemic attack (TIA), and cerebral infarction without residual deficits: Secondary | ICD-10-CM | POA: Diagnosis not present

## 2015-12-14 DIAGNOSIS — D6481 Anemia due to antineoplastic chemotherapy: Secondary | ICD-10-CM

## 2015-12-14 DIAGNOSIS — I10 Essential (primary) hypertension: Secondary | ICD-10-CM | POA: Diagnosis not present

## 2015-12-14 DIAGNOSIS — R100 Acute abdomen: Secondary | ICD-10-CM

## 2015-12-14 DIAGNOSIS — K4020 Bilateral inguinal hernia, without obstruction or gangrene, not specified as recurrent: Secondary | ICD-10-CM | POA: Diagnosis not present

## 2015-12-14 DIAGNOSIS — Z7982 Long term (current) use of aspirin: Secondary | ICD-10-CM

## 2015-12-14 DIAGNOSIS — K5790 Diverticulosis of intestine, part unspecified, without perforation or abscess without bleeding: Secondary | ICD-10-CM | POA: Diagnosis not present

## 2015-12-14 DIAGNOSIS — R109 Unspecified abdominal pain: Secondary | ICD-10-CM | POA: Diagnosis not present

## 2015-12-14 DIAGNOSIS — R509 Fever, unspecified: Secondary | ICD-10-CM

## 2015-12-14 DIAGNOSIS — C8441 Peripheral T-cell lymphoma, not classified, lymph nodes of head, face, and neck: Secondary | ICD-10-CM

## 2015-12-14 DIAGNOSIS — D696 Thrombocytopenia, unspecified: Secondary | ICD-10-CM | POA: Diagnosis not present

## 2015-12-14 DIAGNOSIS — R110 Nausea: Secondary | ICD-10-CM | POA: Diagnosis not present

## 2015-12-14 DIAGNOSIS — R0602 Shortness of breath: Secondary | ICD-10-CM | POA: Diagnosis not present

## 2015-12-14 DIAGNOSIS — R439 Unspecified disturbances of smell and taste: Secondary | ICD-10-CM | POA: Diagnosis not present

## 2015-12-14 DIAGNOSIS — E860 Dehydration: Secondary | ICD-10-CM | POA: Diagnosis not present

## 2015-12-14 DIAGNOSIS — Z87891 Personal history of nicotine dependence: Secondary | ICD-10-CM | POA: Diagnosis not present

## 2015-12-14 DIAGNOSIS — R140 Abdominal distension (gaseous): Secondary | ICD-10-CM | POA: Diagnosis not present

## 2015-12-14 DIAGNOSIS — E43 Unspecified severe protein-calorie malnutrition: Secondary | ICD-10-CM

## 2015-12-14 LAB — COMPREHENSIVE METABOLIC PANEL
ALK PHOS: 174 U/L — AB (ref 38–126)
ALT: 40 U/L (ref 17–63)
ANION GAP: 8 (ref 5–15)
AST: 31 U/L (ref 15–41)
Albumin: 2.9 g/dL — ABNORMAL LOW (ref 3.5–5.0)
BUN: 10 mg/dL (ref 6–20)
CALCIUM: 8.3 mg/dL — AB (ref 8.9–10.3)
CHLORIDE: 99 mmol/L — AB (ref 101–111)
CO2: 26 mmol/L (ref 22–32)
Creatinine, Ser: 0.68 mg/dL (ref 0.61–1.24)
GFR calc non Af Amer: >60 mL/min (ref >60)
Glucose, Bld: 129 mg/dL — ABNORMAL HIGH (ref 65–99)
Potassium: 3 mmol/L — ABNORMAL LOW (ref 3.5–5.1)
SODIUM: 133 mmol/L — AB (ref 135–145)
Total Bilirubin: 0.2 mg/dL — ABNORMAL LOW (ref 0.3–1.2)
Total Protein: 6.1 g/dL — ABNORMAL LOW (ref 6.5–8.1)

## 2015-12-14 LAB — CBC WITH DIFFERENTIAL/PLATELET
Band Neutrophils: 8 %
Basophils Absolute: 0 10*3/uL (ref 0–0.1)
Basophils Relative: 0 %
Eosinophils Absolute: 0 10*3/uL (ref 0–0.7)
Eosinophils Relative: 0 %
HCT: 28.7 % — ABNORMAL LOW (ref 40.0–52.0)
Hemoglobin: 9.3 g/dL — ABNORMAL LOW (ref 13.0–18.0)
LYMPHS ABS: 2.7 10*3/uL (ref 1.0–3.6)
LYMPHS PCT: 8 %
MCH: 26.5 pg (ref 26.0–34.0)
MCHC: 32.2 g/dL (ref 32.0–36.0)
MCV: 82.2 fL (ref 80.0–100.0)
MONO ABS: 3.3 10*3/uL — AB (ref 0.2–1.0)
MONOS PCT: 10 %
Metamyelocytes Relative: 3 %
Myelocytes: 2 %
NEUTROS ABS: 27.3 10*3/uL — AB (ref 1.4–6.5)
NRBC: 2 /100{WBCs} — AB
Neutrophils Relative %: 69 %
PLATELETS: 244 10*3/uL (ref 150–440)
RBC: 3.5 MIL/uL — ABNORMAL LOW (ref 4.40–5.90)
RDW: 20.8 % — ABNORMAL HIGH (ref 11.5–14.5)
WBC: 33.3 10*3/uL — ABNORMAL HIGH (ref 3.8–10.6)

## 2015-12-14 MED ORDER — POTASSIUM CHLORIDE CRYS ER 20 MEQ PO TBCR: 20.0000 meq | EXTENDED_RELEASE_TABLET | Freq: Two times a day (BID) | ORAL | Status: AC

## 2015-12-14 MED ORDER — PEGFILGRASTIM 6 MG/0.6ML ~~LOC~~ PSKT
6.0000 mg | PREFILLED_SYRINGE | Freq: Once | SUBCUTANEOUS | Status: AC
  Administered 2015-12-14: 6 mg via SUBCUTANEOUS

## 2015-12-14 MED ORDER — HEPARIN SOD (PORK) LOCK FLUSH 100 UNIT/ML IV SOLN
500.0000 [IU] | Freq: Once | INTRAVENOUS | Status: AC | PRN
  Administered 2015-12-14: 500 [IU]
  Filled 2015-12-14 (×2): qty 5

## 2015-12-14 MED ORDER — GEMCITABINE HCL CHEMO INJECTION 1 GM/26.3ML
1700.0000 mg | Freq: Once | INTRAVENOUS | Status: AC
  Administered 2015-12-14: 1700 mg via INTRAVENOUS
  Filled 2015-12-14: qty 39.45

## 2015-12-14 MED ORDER — DEXTROSE 5 % IV SOLN
Freq: Once | INTRAVENOUS | Status: AC
  Administered 2015-12-14: 12:00:00 via INTRAVENOUS
  Filled 2015-12-14: qty 1000

## 2015-12-14 MED ORDER — SODIUM CHLORIDE 0.9 % IV SOLN
10.0000 mg | Freq: Once | INTRAVENOUS | Status: AC
  Administered 2015-12-14: 10 mg via INTRAVENOUS
  Filled 2015-12-14: qty 1

## 2015-12-14 MED ORDER — OXALIPLATIN CHEMO INJECTION 100 MG/20ML
80.0000 mg/m2 | Freq: Once | INTRAVENOUS | Status: AC
  Administered 2015-12-14: 170 mg via INTRAVENOUS
  Filled 2015-12-14: qty 34

## 2015-12-14 MED ORDER — PALONOSETRON HCL INJECTION 0.25 MG/5ML
0.2500 mg | Freq: Once | INTRAVENOUS | Status: AC
  Administered 2015-12-14: 0.25 mg via INTRAVENOUS
  Filled 2015-12-14: qty 5

## 2015-12-14 MED ORDER — SODIUM CHLORIDE 0.9 % IV SOLN
INTRAVENOUS | Status: DC
  Administered 2015-12-14: 10:00:00 via INTRAVENOUS
  Filled 2015-12-14: qty 1000

## 2015-12-14 NOTE — Progress Notes (Signed)
[place]  [phone] [phone]  ID: Erik Shelton OB: [birth year]  MR#: [id number]  [id number]  Patient Care Team: [name], MD as PCP - General (Internal Medicine)  CHIEF COMPLAINT:  Chief Complaint  Patient presents with  . Lymphoma    INTERVAL HISTORY: Patient returns to clinic today for follow up from his recent hospitalization and consideration of cycle 3 gemcitabine/oxalipaltin. He went to the ER on December 05, 2015 for abdominal pain. He was found to have diverticulitis. He is feeling better today, only reporting shortness of breath, weakness and fatigue. Shortness of breath is unchanged from previous visits. He is not eating much due to poor taste. He has lost weight. He was on a liquid diet in the hospital but is increasing his intake as tolerated. He does not have diarrhea. He does not have any pain. He does still have neuropathy in his fingers but it is unchanged. He denies any recent fever or chills.    REVIEW OF SYSTEMS:   Review of Systems  Constitutional: Negative for fever, positive for malaise/fatigue. Positive for weight loss. Respiratory: Negative for cough, wheezing.  Positive for shortness of breath. Cardiovascular: Negative.   Gastrointestinal: Negative for nausea and abdominal pain and bloating.  Musculoskeletal: Positive for weakness.   Neurological: Positive for sensory change.   Endo/Heme/Allergies: Does not bruise/bleed easily.   As per HPI. Otherwise, a complete review of systems is negatve.  PAST MEDICAL HISTORY: Past Medical History  Diagnosis Date  . Stroke ([place])   . Seizures ([place])   . Hypercholesteremia   . HTN (hypertension)   . Cancer ([place])     T-Cell Lymphoma  . Seizures ([place])     PAST SURGICAL HISTORY: Past Surgical History  Procedure Laterality Date  . Peripheral vascular catheterization N/A 04/24/2015    Procedure: Glori Luis Cath Insertion;  Surgeon: Algernon Huxley, MD;  Location:  [facility];  Service: Cardiovascular;  Laterality: N/A;  . Portacath placement      FAMILY HISTORY: Reviewed and unchanged. No reported history of malignancy or chronic disease.     ADVANCED DIRECTIVES:    HEALTH MAINTENANCE: Social History  Substance Use Topics  . Smoking status: Former Research scientist (life sciences)  . Smokeless tobacco: Not on file     Comment: Quit  in 1990  . Alcohol Use: Yes     Comment: Occassional glass of wine     No Known Allergies  Current Outpatient Prescriptions  Medication Sig Dispense Refill  . acetaminophen (TYLENOL) 325 MG tablet Take 650 mg by mouth every 6 (six) hours as needed for mild pain, moderate pain, fever or headache.    Marland Kitchen acidophilus (RISAQUAD) CAPS capsule Take 1 capsule by mouth 2 (two) times daily. 20 capsule 0  . Albuterol Sulfate 108 (90 Base) MCG/ACT AEPB Inhale 1 puff into the lungs every 6 (six) hours as needed. 1 each 12  . aspirin EC 325 MG tablet Take 325 mg by mouth daily.    . ciprofloxacin (CIPRO) 500 MG tablet Take 1 tablet (500 mg total) by mouth 2 (two) times daily. 17 tablet 0  . dexamethasone (DECADRON) 4 MG tablet Take 1 tablet (4 mg total) by mouth 2 (two) times daily with a meal. Until 11/22/15.  Then take 1 tablet daily x7 days, then 1 tablet every other day until finished 20 tablet 0  . feeding supplement (BOOST / RESOURCE BREEZE) LIQD Take 1 Container by mouth 3 (three) times daily with meals. [place]  0  . lidocaine-prilocaine (EMLA) cream Apply 1 application topically as needed. 30 g 2  . losartan (COZAAR) 50 MG tablet Take 50 mg by mouth daily.    . metroNIDAZOLE (FLAGYL) 500 MG tablet Take 1 tablet (500 mg total) by mouth every 8 (eight) hours. 14 tablet 0  . omeprazole (PRILOSEC) 40 MG capsule Take 1 capsule (40 mg total) by mouth daily. 90 capsule 2  . ondansetron (ZOFRAN) 4 MG tablet Take 1 tablet (4 mg total) by mouth every 8 (eight) hours as needed for nausea. 30 tablet 1  . oxyCODONE-acetaminophen (ROXICET) 5-325  MG tablet Take 1 tablet by mouth every 4 (four) hours as needed for severe pain. 60 tablet 0  . pravastatin (PRAVACHOL) 80 MG tablet Take 80 mg by mouth daily.    Marland Kitchen HYDROcodone-acetaminophen (NORCO/VICODIN) 5-325 MG tablet Take 1-2 tablets by mouth every 4 (four) hours as needed for moderate pain. (Patient not taking: Reported on 12/14/2015) 30 tablet 0  . potassium chloride SA (K-DUR,KLOR-CON) 20 MEQ tablet Take 1 tablet (20 mEq total) by mouth 2 (two) times daily. 60 tablet 3   No current facility-administered medications for this visit.    OBJECTIVE: Filed Vitals:   12/14/15 0907  BP: 135/80  Pulse: 125  Temp: 96 F (35.6 C)  Resp: 18     Body mass index is 24.55 kg/(m^2).    ECOG FS:0 - Asymptomatic  General: Well-developed, well-nourished, no acute distress. Eyes: Pink conjunctiva, anicteric sclera. Lungs: Clear to auscultation bilaterally. Heart: Regular rate and rhythm. No rubs, murmurs, or gallops. Abdomen: Soft, nontender, distended. No organomegaly noted, normoactive bowel sounds. Musculoskeletal: No edema, cyanosis, or clubbing. Neuro: Alert, answering all questions appropriately. Cranial nerves grossly intact. Skin: No rashes or petechiae noted. Psych: Normal affect. Lymphatics: No palpable lymphadenopathy.   LAB RESULTS:  Lab Results  Component Value Date   NA 133* 12/14/2015   K 3.0* 12/14/2015   CL 99* 12/14/2015   CO2 26 12/14/2015   GLUCOSE 129* 12/14/2015   BUN 10 12/14/2015   CREATININE 0.68 12/14/2015   CALCIUM 8.3* 12/14/2015   PROT 6.1* 12/14/2015   ALBUMIN 2.9* 12/14/2015   AST 31 12/14/2015   ALT 40 12/14/2015   ALKPHOS 174* 12/14/2015   BILITOT 0.2* 12/14/2015   GFRNONAA >60 12/14/2015   GFRAA >60 12/14/2015    Lab Results  Component Value Date   WBC 33.3* 12/14/2015   NEUTROABS 27.3* 12/14/2015   HGB 9.3* 12/14/2015   HCT 28.7* 12/14/2015   MCV 82.2 12/14/2015   PLT 244 12/14/2015     STUDIES: Dg Chest 2 View  12/05/2015   CLINICAL DATA:  Weakness and diarrhea for 2 days. The patient is receiving chemotherapy for lymphoma. EXAM: CHEST  2 VIEW COMPARISON:  PA and lateral chest 11/22/2015.  CT chest 10/24/2015. FINDINGS: Port-A-Cath is in place. There is some mild right middle lobe atelectasis. Mediastinal and hilar lymphadenopathy is identified. No pneumothorax or pleural effusion. IMPRESSION: No acute abnormality. Lymphadenopathy without marked change since the prior plain films. Electronically Signed   By: Inge Rise M.D.   On: 12/05/2015 19:19   Dg Chest 2 View  11/22/2015  CLINICAL DATA:  Abdominal pain and diarrhea, history of lymphoma with shortness of Breath EXAM: CHEST  2 VIEW COMPARISON:  10/24/2015 FINDINGS: Cardiac shadow remains mildly enlarged. A right chest wall port is again seen and stable. The previously noted right pleural effusion has resolved in the interval. The fullness in the hila bilaterally has also  reduced somewhat in the interval from the prior exam. Mild left basilar atelectasis is seen. IMPRESSION: Improvement in the degree of hilar fullness and right-sided pleural effusion. New left basilar atelectasis. Electronically Signed   By: Inez Catalina M.D.   On: 11/22/2015 10:30   Ct Abdomen Pelvis W Contrast  12/05/2015  CLINICAL DATA:  Generalized weakness and diffuse abdominal pain with nausea and diarrhea for the past 2 days. EXAM: CT ABDOMEN AND PELVIS WITH CONTRAST TECHNIQUE: Multidetector CT imaging of the abdomen and pelvis was performed using the standard protocol following bolus administration of intravenous contrast. CONTRAST:  14mL OMNIPAQUE IOHEXOL 300 MG/ML  SOLN COMPARISON:  11/22/2015 FINDINGS: Lower chest: Bibasilar atelectasis but no effusions or worrisome pulmonary lesions. Stable bilateral low-attenuation right infrahilar lymph nodes. Hepatobiliary: No focal hepatic lesions or intrahepatic biliary dilatation. The gallbladder is normal. No common bile duct dilatation. Pancreas: No  mass, inflammation or ductal dilatation. Spleen: Normal size.  No focal lesions. Adrenals/Urinary Tract: The adrenal glands are normal in stable. Stable large bilateral renal cysts. No worrisome renal lesions or hydronephrosis. Stomach/Bowel: The stomach, duodenum and small bowel are unremarkable. No inflammatory changes, mass lesions or obstructive findings. The terminal ileum is normal. The appendix is normal. Slight worsening of diverticulitis involving the upper sigmoid colon. No complicating features such as abscess or free air. The remainder of the colon is unremarkable. Moderate stool throughout. Vascular/Lymphatic: No mesenteric or retroperitoneal mass or adenopathy. Small scattered lymph nodes are stable. The aorta and branch vessels are patent. The major venous structures are patent. Other: No pelvic mass or adenopathy. No free pelvic fluid collections. The bladder appears normal. The prostate gland and seminal vesicles are unremarkable. Stable prominent bilateral inguinal rings containing fat. Stable scattered inguinal lymph nodes. Musculoskeletal: No significant bony findings. IMPRESSION: 1. Progressive findings of acute diverticulitis involving the upper sigmoid colon. No abscess or free air. 2. Stable bilateral renal cysts and very prominent para renal/retroperitoneal fat. Electronically Signed   By: Marijo Sanes M.D.   On: 12/05/2015 21:37   Ct Abdomen Pelvis W Contrast  11/22/2015  CLINICAL DATA:  Abdominal pain, diarrhea and nausea beginning at 0100 hours, lymphoma being treated with radiation at this time, weak, pale, hypertension, former smoker EXAM: CT ABDOMEN AND PELVIS WITH CONTRAST TECHNIQUE: Multidetector CT imaging of the abdomen and pelvis was performed using the standard protocol following bolus administration of intravenous contrast. Sagittal and coronal MPR images reconstructed from axial data set. CONTRAST:  132mL OMNIPAQUE IOHEXOL 300 MG/ML SOLN IV. Dilute oral contrast. COMPARISON:   PET-CT 10/20/2015 FINDINGS: BILATERAL hilar adenopathy nodes measuring up to 18 mm short axis RIGHT image 1 and 11 mm short axis LEFT image 2, corresponding to enlarged nodes with FDG localization on prior PET-CT. Visualized lung bases clear. Multiple BILATERAL renal cysts largest upper pole RIGHT kidney 6.2 x 5.2 cm image 34. Liver, gallbladder, spleen, pancreas, kidneys, and adrenal glands otherwise normal. Probable small splenule 2.1 cm diameter inferior to spleen unchanged. Area of nodularity along the inner surface of the LEFT upper quadrant posterior lateral abdominal wall fascia 11 x 5 mm image 37 unchanged since 11/23/2013. Scattered normal sized retroperitoneal lymph nodes. 10 mm gastrohepatic ligament node image 24 previously 14 mm. 10 mm short axis peripancreatic node image 25 previously 11 mm. BILATERAL inguinal hernias containing fat. Normal appendix. Diffuse colonic diverticulosis greatest at distal descending and sigmoid colon. Questionable hyperemia and minimal infiltrative changes at the proximal sigmoid colon cannot exclude subtle diverticulitis changes. Stomach and bowel loops  otherwise normal appearance. Few scattered atherosclerotic calcifications without aneurysm. Normal appearing bladder ureters, and prostate gland. No mass, additional adenopathy, free air, or free fluid. Degenerative disc disease changes L4-L5 and L5-S1 without focal bony destruction. IMPRESSION: BILATERAL inferior hilar adenopathy. BILATERAL renal cysts. BILATERAL inguinal hernias containing fat. Diffuse colonic diverticulosis with questionable subtle acute diverticulitis of the proximal sigmoid colon. Scattered normal size to borderline enlarged intra-abdominal lymph nodes as above. Electronically Signed   By: Lavonia Dana M.D.   On: 11/22/2015 11:16   ONCOLOGIC TREATMENT HISTORY: Patient received 3 cycles of CHOP between January and March 2014 achieving a complete remission. Upon recurrence, patient received 4 cycle CHOP  plus etoposide between August and October 2016. Patient received his lifetime dose of Adriamycin which was dropped from his regimen and he received 4 additional cycles of etoposide plus CVP from November 2016 through February 2017.  ASSESSMENT: Recurrent stage IV T-cell lymphoma.  PLAN:    1. T-cell lymphoma: CT scan results from hospital visit reviewed independently and is without evidence of progression of disease, although patient will still require CT of his chest. Patient will proceed with cycle 3 gem/oxaliplatin today. He will return to clinic in two weeks for consideration of cycle 4 salvage chemotherapy using gemcitabine and oxaliplatin. Will continue with Onpro Neulasta as well.  2. Leukocytosis: Secondary to OnPro Neulasta as above. 3. Anemia: HGB 9.3 today. Secondary to chemotherapy, monitor. 4. Hypokalemia: 3.0 today. Potassium 20 meq BID sent to pharmacy today. 5. Shortness of breath: Continue albuterol. Patient had consultation with radiation oncology for XRT for symptomatic treatment. 6. Nausea: Patient to use ondansetron PRN. 7. 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 Monitor. 8. Depression: Improved. Continue Celexa 10 mg daily. 9. Hypocalcemia: Corrected Calcium 9.18 10. Weight loss: Secondary to altered taste. Patient to continue with Boost and oral intake as often as tolerated.    Patient expressed understanding and was in agreement with this plan. He also understands that He can call clinic at any time with any questions, concerns, or complaints.   Mayra Reel, NP   12/14/2015 10:11 AM   Patient was seen and evaluated independently and I agree with the assessment and plan as above.  Lloyd Huger, MD 12/15/2015 4:42 PM

## 2015-12-14 NOTE — Progress Notes (Signed)
Last week patient was having abdominal pain, not eating or sleeping, and diarrhea so went to ER.  He was admitted for 4 days for diverticulitis treatment.  Feels better than last week but still weak, not able to eat much solid food, and has occasional wheezing with difficulty breathing.  His heart rate today is ranging from 119-125 on pulse ox and he can feel palpitations.

## 2015-12-15 ENCOUNTER — Emergency Department: Payer: Medicare Other

## 2015-12-15 ENCOUNTER — Emergency Department
Admission: EM | Admit: 2015-12-15 | Discharge: 2015-12-15 | Disposition: A | Discharge status: Home / Self Care | Payer: Medicare Other | Attending: Emergency Medicine | Admitting: Emergency Medicine

## 2015-12-15 ENCOUNTER — Other Ambulatory Visit: Payer: Self-pay

## 2015-12-15 DIAGNOSIS — Z7982 Long term (current) use of aspirin: Secondary | ICD-10-CM | POA: Diagnosis not present

## 2015-12-15 DIAGNOSIS — R109 Unspecified abdominal pain: Secondary | ICD-10-CM | POA: Insufficient documentation

## 2015-12-15 DIAGNOSIS — Z79899 Other long term (current) drug therapy: Secondary | ICD-10-CM | POA: Insufficient documentation

## 2015-12-15 DIAGNOSIS — R600 Localized edema: Secondary | ICD-10-CM | POA: Diagnosis not present

## 2015-12-15 DIAGNOSIS — J189 Pneumonia, unspecified organism: Secondary | ICD-10-CM | POA: Insufficient documentation

## 2015-12-15 DIAGNOSIS — I10 Essential (primary) hypertension: Secondary | ICD-10-CM | POA: Insufficient documentation

## 2015-12-15 DIAGNOSIS — Z792 Long term (current) use of antibiotics: Secondary | ICD-10-CM | POA: Insufficient documentation

## 2015-12-15 DIAGNOSIS — Z87891 Personal history of nicotine dependence: Secondary | ICD-10-CM | POA: Diagnosis not present

## 2015-12-15 DIAGNOSIS — J181 Lobar pneumonia, unspecified organism: Secondary | ICD-10-CM

## 2015-12-15 DIAGNOSIS — R000 Tachycardia, unspecified: Secondary | ICD-10-CM | POA: Insufficient documentation

## 2015-12-15 LAB — COMPREHENSIVE METABOLIC PANEL
ALBUMIN: 2.9 g/dL — AB (ref 3.5–5.0)
ALK PHOS: 162 U/L — AB (ref 38–126)
ALT: 45 U/L (ref 17–63)
AST: 42 U/L — AB (ref 15–41)
Anion gap: 10 (ref 5–15)
BILIRUBIN TOTAL: 0.4 mg/dL (ref 0.3–1.2)
BUN: 14 mg/dL (ref 6–20)
CALCIUM: 8.5 mg/dL — AB (ref 8.9–10.3)
CO2: 26 mmol/L (ref 22–32)
Chloride: 98 mmol/L — ABNORMAL LOW (ref 101–111)
Creatinine, Ser: 0.67 mg/dL (ref 0.61–1.24)
GFR calc Af Amer: >60 mL/min (ref >60)
GFR calc non Af Amer: >60 mL/min (ref >60)
GLUCOSE: 245 mg/dL — AB (ref 65–99)
POTASSIUM: 3.6 mmol/L (ref 3.5–5.1)
Sodium: 134 mmol/L — ABNORMAL LOW (ref 135–145)
TOTAL PROTEIN: 6.1 g/dL — AB (ref 6.5–8.1)

## 2015-12-15 LAB — CBC WITH DIFFERENTIAL/PLATELET
BASOS ABS: 0 10*3/uL (ref 0–0.1)
BASOS PCT: 0 %
EOS PCT: 0 %
Eosinophils Absolute: 0 10*3/uL (ref 0–0.7)
HCT: 30.9 % — ABNORMAL LOW (ref 40.0–52.0)
Hemoglobin: 9.7 g/dL — ABNORMAL LOW (ref 13.0–18.0)
LYMPHS PCT: 4 %
Lymphs Abs: 0.8 10*3/uL — ABNORMAL LOW (ref 1.0–3.6)
MCH: 26.6 pg (ref 26.0–34.0)
MCHC: 31.5 g/dL — ABNORMAL LOW (ref 32.0–36.0)
MCV: 84.5 fL (ref 80.0–100.0)
Monocytes Absolute: 1.1 10*3/uL — ABNORMAL HIGH (ref 0.2–1.0)
Monocytes Relative: 5 %
Neutro Abs: 19.9 10*3/uL — ABNORMAL HIGH (ref 1.4–6.5)
Neutrophils Relative %: 91 %
PLATELETS: 285 10*3/uL (ref 150–440)
RBC: 3.65 MIL/uL — AB (ref 4.40–5.90)
RDW: 21.1 % — AB (ref 11.5–14.5)
WBC: 21.9 10*3/uL — AB (ref 3.8–10.6)

## 2015-12-15 LAB — BRAIN NATRIURETIC PEPTIDE: B NATRIURETIC PEPTIDE 5: 398 pg/mL — AB (ref 0.0–100.0)

## 2015-12-15 LAB — TROPONIN I: Troponin I: 0.06 ng/mL — ABNORMAL HIGH (ref <0.031)

## 2015-12-15 LAB — URINALYSIS COMPLETE WITH MICROSCOPIC (ARMC ONLY)
BILIRUBIN URINE: NEGATIVE
Bacteria, UA: NONE SEEN
GLUCOSE, UA: NEGATIVE mg/dL
Hgb urine dipstick: NEGATIVE
KETONES UR: NEGATIVE mg/dL
Nitrite: NEGATIVE
Protein, ur: 100 mg/dL — AB
SQUAMOUS EPITHELIAL / LPF: NONE SEEN
Specific Gravity, Urine: 1.027 (ref 1.005–1.030)
pH: 6 (ref 5.0–8.0)

## 2015-12-15 LAB — FIBRIN DERIVATIVES D-DIMER (ARMC ONLY): FIBRIN DERIVATIVES D-DIMER (ARMC): 6770 — AB (ref 0–499)

## 2015-12-15 LAB — LIPASE, BLOOD: Lipase: 62 U/L — ABNORMAL HIGH (ref 11–51)

## 2015-12-15 LAB — LACTIC ACID, PLASMA
LACTIC ACID, VENOUS: 2.1 mmol/L — AB (ref 0.5–2.0)
LACTIC ACID, VENOUS: 1.8 mmol/L (ref 0.5–2.0)

## 2015-12-15 LAB — PROTIME-INR
INR: 0.97
PROTHROMBIN TIME: 13.1 s (ref 11.4–15.0)

## 2015-12-15 MED ORDER — DIATRIZOATE MEGLUMINE & SODIUM 66-10 % PO SOLN
15.0000 mL | Freq: Once | ORAL | Status: AC
  Administered 2015-12-15: 15 mL via ORAL

## 2015-12-15 MED ORDER — HEPARIN SOD (PORK) LOCK FLUSH 100 UNIT/ML IV SOLN
INTRAVENOUS | Status: AC
  Administered 2015-12-15: 500 [IU]
  Filled 2015-12-15: qty 5

## 2015-12-15 MED ORDER — AZITHROMYCIN 250 MG PO TABS: ORAL_TABLET | ORAL | Status: DC

## 2015-12-15 MED ORDER — SODIUM CHLORIDE 0.9 % IV BOLUS (SEPSIS)
1000.0000 mL | Freq: Once | INTRAVENOUS | Status: AC
  Administered 2015-12-15: 1000 mL via INTRAVENOUS

## 2015-12-15 MED ORDER — IOPAMIDOL (ISOVUE-370) INJECTION 76%
100.0000 mL | Freq: Once | INTRAVENOUS | Status: AC | PRN
  Administered 2015-12-15: 100 mL via INTRAVENOUS

## 2015-12-15 MED ORDER — AZITHROMYCIN 500 MG IV SOLR
500.0000 mg | Freq: Once | INTRAVENOUS | Status: AC
  Administered 2015-12-15: 500 mg via INTRAVENOUS
  Filled 2015-12-15: qty 500

## 2015-12-15 NOTE — ED Notes (Signed)
Only one blood culture obtained because pt refused peripheral stick with butterfly.   IV NS and IV abx infusing.   Pt to be discharged when infusion complete.

## 2015-12-15 NOTE — ED Notes (Signed)
Dr. Reita Cliche aware of pts vitals at discharge.  She has spoken with patient and family and there are no additional nursing orders prior to patient discharge home.

## 2015-12-15 NOTE — ED Notes (Signed)
Pt to ED from PMD office.  States that he had a follow appt today for his most recent hospitalization.   He was found to have tachycardia and pt also reports some SOB

## 2015-12-15 NOTE — ED Notes (Signed)
Port to right chest wall de-accessed prior to d/c.

## 2015-12-15 NOTE — Discharge Instructions (Signed)
You were evaluated for ongoing fatigue and ongoing shortness of breath, and found to have elevated heart rate at clinic appointment. During exam and evaluation, your CT scan shows the possibility of an early right-sided pneumonia, and you were given your first dose of antibiotic here in the emergency department.  As I discussed with you, I'm not certain what is driving the fast heart rate, however your exam and evaluation are looking good. We discussed going ahead and sending you home, but you must return to emergency department if you have any chest palpitations, chest pain, trouble breathing, fever, dizziness or passing out, or any other symptoms that are concerning to you. Follow-up with Dr. Grayland Ormond on Monday.  Neumona extrahospitalaria en los adultos (Community-Acquired Pneumonia, Adult) La neumona es una infeccin en los pulmones. Hay diferentes tipos de neumona. Uno de ellos puede desarrollarse mientras una persona est en el hospital. Un tipo diferente, llamado neumona extrahospitalaria, evoluciona en las personas que no estn o no han estado recientemente en el hospital u otro centro de [place].  CAUSAS La neumona puede ser bacteriana, viral o mictica. Con frecuencia, la causa de la neumona extrahospitalaria es la bacteria Streptococcus pneumoniae. Estas bacterias suelen transmitirse de [place] persona a otra al [employer] gotitas que un individuo infectado libera al toser o Brewing technologist. FACTORES DE RIESGO Es ms probable que la enfermedad se manifieste en:  Las personas que padecen enfermedades crnicas, como enfermedad pulmonar obstructiva crnica (EPOC), asma, insuficiencia cardaca congestiva, fibroso qustica, diabetes o enfermedad renal.  Las personas que tienen el VIH en etapa inicial o avanzada.  Las personas que tienen anemia drepanoctica.  Las personas a las que se les extrajo el bazo (esplenectoma).  Las personas cuya higiene dental es deficiente.  Las personas que sufren  enfermedades que aumentan el riesgo de Social worker (aspirar) las secreciones de la propia boca y Mudlogger.   Las personas cuyo sistema inmunitario est debilitado (inmunodeprimido).  Los fumadores.  Las personas que viajan a regiones donde es frecuente la existencia de los grmenes que causan la neumona.  Las personas que tienen contacto con hbitat de [place] o con animales que son portadores de los grmenes que causan neumona, entre ellos, pjaros, murcilagos, conejos, gatos y [place] de [place]. SNTOMAS Los sntomas de esta enfermedad incluyen lo siguiente:  Tos seca.  Tos con expectoracin (productiva).  Cristy Hilts.  Sudoracin.  Dolor de pecho, especialmente al respirar profundamente o al toser.  Respiracin rpida o dificultad para respirar.  Falta de aire.  Escalofros.  Fatiga.  Dolores musculares. DIAGNSTICO El mdico le har una historia clnica y un examen fsico. Tambin pueden hacerle otros estudios, por ejemplo:  Estudios de diagnstico por imgenes, entre ellos, radiografas.  Anlisis para controlar el nivel de oxgeno y de otros gases en la sangre.  Otros anlisis de [place], de la mucosidad (esputo), el lquido que rodea los pulmones (lquido pleural) y Higher education careers adviser. Si la neumona es grave, se pueden hacer otros estudios para identificar la causa especfica de la enfermedad. TRATAMIENTO El tipo de tratamiento que se administra depende de muchos factores, por ejemplo, la causa de la neumona, los medicamentos que toma y otras enfermedades que padezca. En el caso de la [employer] adultos, el tratamiento de la neumona y la recuperacin pueden hacerse en la casa. En algunos casos, el tratamiento debe administrarse en un hospital. El tratamiento puede incluir lo siguiente:  Antibiticos, si la neumona fue causada por bacterias.  Antivirales, si la neumona fue causada por un  virus.  Medicamentos que se administran por va oral o por va intravenosa  (IV).  Oxgeno.  Terapia respiratoria. Aunque es poco frecuente, el tratamiento de la neumona grave puede incluir lo siguiente:  Geographical information systems officer. Este tratamiento se realiza si no respira bien por s solo y no Production designer, theatre/television/film un nivel de oxgeno [place].  Toracocentesis. Este procedimiento se realiza para extraer el lquido que rodea uno o ambos pulmones, a fin de mejorar la respiracin. [place] los medicamentos de venta libre y los recetados solamente como se lo haya indicado el mdico.  Tome los medicamentos para la tos solamente si no puede dormir bien. Debe entender que los medicamentos para la tos pueden minar la capacidad natural del organismo de Radiographer, therapeutic la mucosidad de los pulmones.  Si le recetaron un antibitico, tmelo como se lo haya indicado el mdico. No deje de tomar los antibiticos aunque comience a Sports administrator.  De noche, duerma semisentado. Intente dormir en un silln reclinable o pngase algunas almohadas debajo de la cabeza.  No consuma productos que contengan tabaco, incluidos cigarrillos, tabaco de Higher education careers adviser y Psychologist, sport and exercise. Si necesita ayuda para dejar de fumar, consulte al mdico.  Beba suficiente agua para mantener la orina clara o de color amarillo plido. Esto ayudar a fluidificar las secreciones mucosas de los pulmones. PREVENCIN Existen formas de reducir el riesgo de tener neumona extrahospitalaria. Considere la posibilidad de aplicarse la vacuna antineumoccica rene estos requisitos:  Es mayor de [id number].  Es mayor de 19aos y est recibiendo Scientist, research (physical sciences), tiene enfermedad pulmonar crnica u otros trastornos que le afectan el sistema inmunitario. Pregntele al mdico si esto es vlido para su caso. Hay diferentes tipos de vacunas antineumoccicas y cronogramas para su aplicacin. Pregntele al mdico cul es la opcin de vacunacin ms [place] para usted. Tambin puede  evitar contraer neumona extrahospitalaria si toma las siguientes medidas:  Se aplica la vacuna antigripal todos los [place]. Pregntele al mdico cul es el tipo de vacuna antigripal ms adecuado para usted.  Visita al dentista peridicamente.  Se lava las manos con frecuencia. Utilice un desinfectante para manos si no dispone de [place] y [place]. SOLICITE ATENCIN MDICA SI:  Jaclynn Guarneri.  No duerme bien porque no es posible controlar la tos con medicamentos para la tos. SOLICITE ATENCIN MDICA DE INMEDIATO SI:  Experimenta un empeoramiento en la falta de aire.  El dolor de pecho es cada vez ms intenso.  La enfermedad empeora, especialmente si usted es un adulto mayor o su sistema inmunitario est debilitado.  Tose y escupe sangre.   Esta informacin no tiene Marine scientist el consejo del mdico. Asegrese de hacerle al mdico cualquier pregunta que tenga.   Document Released: 06/12/2005 Document Revised: 05/24/2015 Elsevier Interactive Patient Education [employer].

## 2015-12-15 NOTE — ED Notes (Signed)
Pt in CT.

## 2015-12-15 NOTE — ED Notes (Signed)
Dr Reita Cliche notified of Lactic Acid.

## 2015-12-15 NOTE — ED Notes (Signed)
Pt to ed with c/o abd pain and tachycardia,  ekg done and pt taken straight to room 17, hr 145.  Care transferred to [facility]

## 2015-12-15 NOTE — ED Provider Notes (Signed)
[facility] Emergency Department Provider Note   ____________________________________________  Time seen: Approximately 11:15 AM I have reviewed the triage vital signs and the triage nursing note.  HISTORY  Chief Complaint Tachycardia  tachycardia  Historian Patient and caregiver. Patient is a native [place] speaker, but declines an interpreter.  HPI Erik Shelton is a 75 y.o. male who is currently under chemotherapy for T-cell lymphoma, and was recently treated in the hospital for several days due to diarrhea/diverticulitis with tachycardia and positive stool culture for Yersinia, Escherichia coli, and salmonella.  Today the patient had a routine follow-up with [facility] clinic after the hospitalization. Patient states she's had some generalized weakness at home, and still pretty poor by mouth intake, but no problems with diarrhea anymore. No fever. No chest pain or palpitations. He has had some occasional shortness of breath. He states he has chronic lower extremity swelling which is no worse than prior and equal bilaterally.  When he presented to [facility] clinic, he was found to have a heart rate in the 140s and was sent to the ED for further evaluation.  They feel he might still be dehydrated.    Past Medical History  Diagnosis Date  . Stroke ([place])   . Seizures ([place])   . Hypercholesteremia   . HTN (hypertension)   . Cancer ([place])     T-Cell Lymphoma  . Seizures [facility])     Patient Active Problem List   Diagnosis Date Noted  . HLD (hyperlipidemia) 12/06/2015  . HTN (hypertension) 12/06/2015  . GERD (gastroesophageal reflux disease) 12/06/2015  . Protein-calorie malnutrition, severe 12/06/2015  . Diverticulitis 12/05/2015  . Neutropenia ([place]) 08/25/2015  . 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 Sepsis ([place]) 08/04/2015  . Fever and chills 08/04/2015  . Intractable pain 04/14/2015  . 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 Peripheral vascular catheterization N/A 04/24/2015    Procedure: Glori Luis Cath Insertion;  Surgeon: Algernon Huxley, MD;  Location: [facility] CV LAB;  Service: Cardiovascular;  Laterality: N/A;  . Portacath placement      Current Outpatient Rx  Name  Route  Sig  Dispense  Refill  . acetaminophen (TYLENOL) 325 MG tablet   Oral   Take 650 mg by mouth every 6 (six) hours as needed for mild pain, moderate pain, fever or headache.         Marland Kitchen acidophilus (RISAQUAD) CAPS capsule   Oral   Take 1 capsule by mouth 2 (two) times daily.   20 capsule   0   . Albuterol Sulfate 108 (90 Base) MCG/ACT AEPB   Inhalation   Inhale 1 puff into the lungs every 6 (six) hours as needed.   1 each   12   . aspirin EC 325 MG tablet   Oral   Take 325 mg by mouth daily.         . ciprofloxacin (CIPRO) 500 MG tablet   Oral   Take 1 tablet (500 mg total) by mouth 2 (two) times daily.   17 tablet   0   . dexamethasone (DECADRON) 4 MG tablet   Oral   Take 1 tablet (4 mg total) by mouth 2 (two) times daily with a meal. Until 11/22/15.  Then take 1 tablet daily x7 days, then 1  tablet every other day until finished   20 tablet   0   . lidocaine-prilocaine (EMLA) cream   Topical   Apply 1 application topically as needed.   30 g   2   . losartan (COZAAR) 50 MG tablet   Oral   Take 50 mg by mouth daily.         . metroNIDAZOLE (FLAGYL) 500 MG tablet   Oral   Take 1 tablet (500 mg total) by mouth every 8 (eight) hours.   14 tablet   0   . omeprazole (PRILOSEC) 40 MG capsule   Oral   Take 1 capsule (40 mg total) by mouth daily.   90 capsule   2   . ondansetron (ZOFRAN) 4 MG tablet   Oral   Take 1 tablet (4 mg total) by mouth every 8 (eight) hours as needed for nausea.   30 tablet   1   . oxyCODONE-acetaminophen (ROXICET)  5-325 MG tablet   Oral   Take 1 tablet by mouth every 4 (four) hours as needed for severe pain.   60 tablet   0   . potassium chloride SA (K-DUR,KLOR-CON) 20 MEQ tablet   Oral   Take 1 tablet (20 mEq total) by mouth 2 (two) times daily.   60 tablet   3   . pravastatin (PRAVACHOL) 80 MG tablet   Oral   Take 80 mg by mouth daily.         Marland Kitchen azithromycin (ZITHROMAX) 250 MG tablet      1 tablet daily for 4 more days   4 each   0     Allergies Review of patient's allergies indicates no known allergies.  History reviewed. No pertinent family history.  Social History Social History  Substance Use Topics  . Smoking status: Former Research scientist (life sciences)  . Smokeless tobacco: None     Comment: Quit  in 1990  . Alcohol Use: Yes     Comment: Occassional glass of wine- none for over a year    Review of Systems  Constitutional: Negative for fever. Eyes: Negative for visual changes. ENT: Negative for sore throat. Cardiovascular: Negative for chest pain. Respiratory: Positive for mild and occasional shortness of breath. Denies pleuritic chest pain. Gastrointestinal: Had reported mild abdominal pain to triage nurse, denies any real abdominal pain for me. Genitourinary: Negative for dysuria. Musculoskeletal: Negative for back pain. Skin: Negative for rash. Neurological: Negative for headache. 10 point Review of Systems otherwise negative ____________________________________________   PHYSICAL EXAM:  VITAL SIGNS: ED Triage Vitals  Enc Vitals Group     BP 12/15/15 1106 109/70 mmHg     Pulse Rate 12/15/15 1106 145     Resp 12/15/15 1106 18     Temp 12/15/15 1106 98.4 F (36.9 C)     Temp Source 12/15/15 1106 Oral     SpO2 12/15/15 1106 100 %     Weight 12/15/15 1106 180 lb (81.647 kg)     Height 12/15/15 1106 5\' 11"  (1.803 m)     Head Cir --      Peak Flow --      Pain Score --      Pain Loc --      Pain Edu? --      Excl. in [place]? --      Constitutional: Alert and oriented.  Well appearing and in no distress. HEENT   Head: Normocephalic and atraumatic.      Eyes: Conjunctivae are normal. PERRL.  Normal extraocular movements.      Ears:         Nose: No congestion/rhinnorhea.   Mouth/Throat: Mucous membranes are mildly dry.   Neck: No stridor. Cardiovascular/Chest: Tachycardic, regular rhythm.  No murmurs, rubs, or gallops. Respiratory: Normal respiratory effort without tachypnea nor retractions. Breath sounds are clear and equal bilaterally. No wheezes/rales/rhonchi. Gastrointestinal: Soft. No distention, no guarding, no rebound. Nontender.   Genitourinary/rectal:Deferred Musculoskeletal: Nontender with normal range of motion in all extremities. No joint effusions. Bilateral lower extremity pitting edema, 2+ and equal bilaterally. No calf tenderness. Neurologic:  Normal speech and language. No gross or focal neurologic deficits are appreciated. Skin:  Skin is warm, dry and intact. No rash noted. Psychiatric: Mood and affect are normal. Speech and behavior are normal. Patient exhibits appropriate insight and judgment.  ____________________________________________   EKG I, Lisa Roca, MD, the attending physician have personally viewed and interpreted all ECGs.  143 bpm. Sinus tachycardia. Narrow QRS. Normal axis. Nonspecific T wave. ____________________________________________  LABS (pertinent positives/negatives)  Urinalysis trace leukocytes otherwise negative Lactic acid 2.1, and repeat 1.8 Conference metabolic panel significant for sodium 134, otherwise without significant abnormality. His calcium is 8.5 and troponin 0.06 White blood count 21.9 and hemoglobin 9.7 platelet count 285 INR 0.97 D-dimer 6770 Lipase 62 BNP 398  ____________________________________________  RADIOLOGY All Xrays were viewed by me. Imaging interpreted by Radiologist.  Chest and abdomen:  IMPRESSION: No evidence of pulmonary embolus.  Bilateral hilar,  axillary and mediastinal adenopathy essentially stable since prior PET CT.  Bronchial wall thickening and mucous plugging in the lower lobes bilaterally. Patchy ground-glass airspace disease in the right lower lobe could reflect early bronchopneumonia.  Scattered nodular densities in the upper lobes bilaterally are new since prior PET CT. These could be inflammatory. Recommend follow-up CT in 3 months to ensure resolution.  Resolution of the previously seen diverticulitis changes around the sigmoid colon. Diverticulosis changes again noted without active inflammation currently.  No acute findings in the abdomen or pelvis.  __________________________________________  PROCEDURES  Procedure(s) performed: None  Critical Care performed: None  ____________________________________________   ED COURSE / ASSESSMENT AND PLAN  Pertinent labs & imaging results that were available during my care of the patient were reviewed by me and considered in my medical decision making (see chart for details).   This patient arrives significantly tachycardic, but without fever or hypotension. My initial suspicion is likely dehydration given poor by mouth intake and generalized weakness at home after recent hospitalization for diverticulitis/diarrheal illness.  Other considerations include patient would be at risk for PE given her recent hospitalization. He is not hypoxic or complaining of pleuritic chest pain. I will go ahead and send a d-dimer.    At this point I'm not too concerned about infection/sepsis, but will keep this on the differential.  I was able to review the patient's oncology no from the nurse practitioner yesterday, patient was noted to have heart rate in the 120s yesterday, as well as the complaint of fatigue and shortness of breath which was felt to be due symptomatically to chest lymphoma. He did receive chemotherapy yesterday. 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 He has a  known anemia reportedly at 9.3.   CT showed no pulmonary embolism. There is evidence of possible right lower lobe bronchopneumonia, and patient placed on azithromycin.  Again the patient has recently been hospitalized on antibiotics for diverticulitis which is now cleared, and I don't feel like placing the patient on hospital-acquired heavy-duty  robotics at this point in time given his clinical picture.  Clinically the patient has not had a fever, he is not hypoxic with O2 sat of 100%, and denies coughing, and so I really do not think that his tachycardia is related to this pneumonia in terms of SIRS.    She does not necessarily reported history of tachycardia, however during his entire hospital stay he was tachycardic, and he was tachycardic in the oncology office yesterday.  I did discuss this case with the on-call oncologist, Dr. Mike Gip, who did state that often oncology patients do have a resting sinus tachycardia, and did not necessarily recommend hospitalization or stronger antibiotics simply due to the sinus tachycardia of 110-120.  She was supportive of my clinical impression and the patient's preference to go home today.  Oncology appointment was scheduled for the patient on Monday. He has wife understand strict return precautions.   CONSULTATIONS:   Phone discussion with Dr. Mike Gip, oncology.   Patient / Family / Caregiver informed of clinical course, medical decision-making process, and agree with plan.   I discussed return precautions, follow-up instructions, and discharged instructions with patient and/or family.   ___________________________________________   FINAL CLINICAL IMPRESSION(S) / ED DIAGNOSES   Final diagnoses:  Sinus tachycardia ([place])  Right lower lobe pneumonia              Note: This dictation was prepared with Dragon dictation. Any transcriptional errors that result from this process are unintentional   Lisa Roca, MD 12/15/15 1743

## 2015-12-18 ENCOUNTER — Other Ambulatory Visit: Payer: Self-pay | Admitting: *Deleted

## 2015-12-18 ENCOUNTER — Inpatient Hospital Stay: Payer: Medicare Other

## 2015-12-18 ENCOUNTER — Inpatient Hospital Stay: Payer: Medicare Other | Attending: Oncology | Admitting: Oncology

## 2015-12-18 DIAGNOSIS — G8929 Other chronic pain: Secondary | ICD-10-CM | POA: Diagnosis not present

## 2015-12-18 DIAGNOSIS — R5381 Other malaise: Secondary | ICD-10-CM | POA: Insufficient documentation

## 2015-12-18 DIAGNOSIS — R000 Tachycardia, unspecified: Secondary | ICD-10-CM | POA: Diagnosis not present

## 2015-12-18 DIAGNOSIS — I959 Hypotension, unspecified: Secondary | ICD-10-CM | POA: Diagnosis not present

## 2015-12-18 DIAGNOSIS — I10 Essential (primary) hypertension: Secondary | ICD-10-CM | POA: Diagnosis not present

## 2015-12-18 DIAGNOSIS — M79621 Pain in right upper arm: Secondary | ICD-10-CM | POA: Diagnosis not present

## 2015-12-18 DIAGNOSIS — E7800 Pure hypercholesterolemia, unspecified: Secondary | ICD-10-CM | POA: Insufficient documentation

## 2015-12-18 DIAGNOSIS — R61 Generalized hyperhidrosis: Secondary | ICD-10-CM | POA: Insufficient documentation

## 2015-12-18 DIAGNOSIS — Z7982 Long term (current) use of aspirin: Secondary | ICD-10-CM | POA: Diagnosis not present

## 2015-12-18 DIAGNOSIS — R0602 Shortness of breath: Secondary | ICD-10-CM

## 2015-12-18 DIAGNOSIS — R109 Unspecified abdominal pain: Secondary | ICD-10-CM | POA: Insufficient documentation

## 2015-12-18 DIAGNOSIS — Z87891 Personal history of nicotine dependence: Secondary | ICD-10-CM | POA: Diagnosis not present

## 2015-12-18 DIAGNOSIS — E876 Hypokalemia: Secondary | ICD-10-CM | POA: Diagnosis not present

## 2015-12-18 DIAGNOSIS — D649 Anemia, unspecified: Secondary | ICD-10-CM | POA: Insufficient documentation

## 2015-12-18 DIAGNOSIS — C8441 Peripheral T-cell lymphoma, not classified, lymph nodes of head, face, and neck: Secondary | ICD-10-CM

## 2015-12-18 DIAGNOSIS — T451X5S Adverse effect of antineoplastic and immunosuppressive drugs, sequela: Secondary | ICD-10-CM | POA: Diagnosis not present

## 2015-12-18 DIAGNOSIS — R634 Abnormal weight loss: Secondary | ICD-10-CM | POA: Diagnosis not present

## 2015-12-18 DIAGNOSIS — E860 Dehydration: Secondary | ICD-10-CM

## 2015-12-18 DIAGNOSIS — C8448 Peripheral T-cell lymphoma, not classified, lymph nodes of multiple sites: Secondary | ICD-10-CM | POA: Diagnosis not present

## 2015-12-18 DIAGNOSIS — M79622 Pain in left upper arm: Secondary | ICD-10-CM | POA: Diagnosis not present

## 2015-12-18 DIAGNOSIS — R509 Fever, unspecified: Secondary | ICD-10-CM | POA: Diagnosis not present

## 2015-12-18 DIAGNOSIS — R439 Unspecified disturbances of smell and taste: Secondary | ICD-10-CM | POA: Diagnosis not present

## 2015-12-18 DIAGNOSIS — R52 Pain, unspecified: Secondary | ICD-10-CM | POA: Diagnosis not present

## 2015-12-18 DIAGNOSIS — K5790 Diverticulosis of intestine, part unspecified, without perforation or abscess without bleeding: Secondary | ICD-10-CM

## 2015-12-18 DIAGNOSIS — G620 Drug-induced polyneuropathy: Secondary | ICD-10-CM | POA: Diagnosis not present

## 2015-12-18 DIAGNOSIS — R531 Weakness: Secondary | ICD-10-CM | POA: Diagnosis not present

## 2015-12-18 DIAGNOSIS — Z79899 Other long term (current) drug therapy: Secondary | ICD-10-CM | POA: Diagnosis not present

## 2015-12-18 DIAGNOSIS — Z9221 Personal history of antineoplastic chemotherapy: Secondary | ICD-10-CM | POA: Diagnosis not present

## 2015-12-18 DIAGNOSIS — D72829 Elevated white blood cell count, unspecified: Secondary | ICD-10-CM | POA: Diagnosis not present

## 2015-12-18 DIAGNOSIS — R110 Nausea: Secondary | ICD-10-CM | POA: Diagnosis not present

## 2015-12-18 DIAGNOSIS — K219 Gastro-esophageal reflux disease without esophagitis: Secondary | ICD-10-CM

## 2015-12-18 MED ORDER — HEPARIN SOD (PORK) LOCK FLUSH 100 UNIT/ML IV SOLN
500.0000 [IU] | Freq: Once | INTRAVENOUS | Status: AC
  Administered 2015-12-18: 500 [IU] via INTRAVENOUS
  Filled 2015-12-18: qty 5

## 2015-12-18 MED ORDER — SODIUM CHLORIDE 0.9 % IV SOLN
Freq: Once | INTRAVENOUS | Status: AC
  Administered 2015-12-18: 12:00:00 via INTRAVENOUS
  Filled 2015-12-18: qty 1000

## 2015-12-18 MED ORDER — SODIUM CHLORIDE 0.9 % IV SOLN: 50.0000 mg | Freq: Once | INTRAVENOUS | Status: DC

## 2015-12-18 MED ORDER — FAMOTIDINE IN NACL 20-0.9 MG/50ML-% IV SOLN
20.0000 mg | Freq: Once | INTRAVENOUS | Status: AC
  Administered 2015-12-18: 20 mg via INTRAVENOUS
  Filled 2015-12-18: qty 50

## 2015-12-18 MED ORDER — SODIUM CHLORIDE 0.9 % IV SOLN
Freq: Once | INTRAVENOUS | Status: AC
  Administered 2015-12-18: 13:00:00 via INTRAVENOUS
  Filled 2015-12-18: qty 4

## 2015-12-18 NOTE — Progress Notes (Signed)
Patient was at PCP office Friday where they did an EKG and advised him to go to ER where he was cleared for MI but was/is tachycardic, his heart rate is 141 with bp 82/59 in the office today.  He is having abdominal pain that feels like a "ball in stomach" that causes him to have nausea, not able to eat due to increases the pain, cough, and difficulty breathing with pain.  The Oxycodone does slightly help the pain but the last his last does was at 8:40 last night.

## 2015-12-20 ENCOUNTER — Inpatient Hospital Stay: Payer: Medicare Other

## 2015-12-20 ENCOUNTER — Telehealth: Payer: Self-pay

## 2015-12-20 ENCOUNTER — Other Ambulatory Visit: Payer: Self-pay

## 2015-12-20 DIAGNOSIS — C8448 Peripheral T-cell lymphoma, not classified, lymph nodes of multiple sites: Secondary | ICD-10-CM | POA: Diagnosis not present

## 2015-12-20 DIAGNOSIS — C8441 Peripheral T-cell lymphoma, not classified, lymph nodes of head, face, and neck: Secondary | ICD-10-CM

## 2015-12-20 LAB — CULTURE, BLOOD (ROUTINE X 2): Culture: NO GROWTH

## 2015-12-20 MED ORDER — RANITIDINE HCL 1000 MG/40ML IJ SOLN
50.0000 mg | Freq: Once | INTRAMUSCULAR | Status: DC
Start: 2015-12-20 — End: 2015-12-20

## 2015-12-20 MED ORDER — HEPARIN SOD (PORK) LOCK FLUSH 100 UNIT/ML IV SOLN
500.0000 [IU] | Freq: Once | INTRAVENOUS | Status: AC
Start: 2015-12-20 — End: 2015-12-20
  Administered 2015-12-20: 500 [IU] via INTRAVENOUS
  Filled 2015-12-20: qty 5

## 2015-12-20 MED ORDER — FAMOTIDINE IN NACL 20-0.9 MG/50ML-% IV SOLN
20.0000 mg | Freq: Two times a day (BID) | INTRAVENOUS | Status: DC
  Administered 2015-12-20: 20 mg via INTRAVENOUS
  Filled 2015-12-20: qty 50

## 2015-12-20 MED ORDER — SODIUM CHLORIDE 0.9 % IV SOLN
Freq: Once | INTRAVENOUS | Status: AC
  Administered 2015-12-20: 11:00:00 via INTRAVENOUS
  Filled 2015-12-20: qty 1000

## 2015-12-20 MED ORDER — DIPHENOXYLATE-ATROPINE 2.5-0.025 MG PO TABS: 1.0000 | ORAL_TABLET | Freq: Four times a day (QID) | ORAL | Status: AC | PRN

## 2015-12-20 MED ORDER — SODIUM CHLORIDE 0.9 % IV SOLN
Freq: Once | INTRAVENOUS | Status: AC
  Administered 2015-12-20: 11:00:00 via INTRAVENOUS
  Filled 2015-12-20: qty 4

## 2015-12-20 NOTE — Telephone Encounter (Signed)
He has taken 4 doses of Imodium OTC today but he is still having diarrhea.  Can they get rx to help?

## 2015-12-20 NOTE — Telephone Encounter (Signed)
Lomotil.  Thanks! 

## 2015-12-21 ENCOUNTER — Ambulatory Visit: Payer: Medicare Other

## 2015-12-22 ENCOUNTER — Inpatient Hospital Stay: Payer: Medicare Other

## 2015-12-22 DIAGNOSIS — C8441 Peripheral T-cell lymphoma, not classified, lymph nodes of head, face, and neck: Secondary | ICD-10-CM

## 2015-12-22 DIAGNOSIS — C8448 Peripheral T-cell lymphoma, not classified, lymph nodes of multiple sites: Secondary | ICD-10-CM | POA: Diagnosis not present

## 2015-12-22 MED ORDER — FAMOTIDINE IN NACL 20-0.9 MG/50ML-% IV SOLN
20.0000 mg | Freq: Once | INTRAVENOUS | Status: AC
  Administered 2015-12-22: 20 mg via INTRAVENOUS
  Filled 2015-12-22: qty 50

## 2015-12-22 MED ORDER — SODIUM CHLORIDE 0.9 % IV SOLN
Freq: Once | INTRAVENOUS | Status: AC
  Administered 2015-12-22: 12:00:00 via INTRAVENOUS
  Filled 2015-12-22: qty 1000

## 2015-12-22 MED ORDER — SODIUM CHLORIDE 0.9 % IV SOLN
Freq: Once | INTRAVENOUS | Status: AC
  Administered 2015-12-22: 12:00:00 via INTRAVENOUS
  Filled 2015-12-22: qty 4

## 2015-12-22 MED ORDER — SODIUM CHLORIDE 0.9% FLUSH
10.0000 mL | INTRAVENOUS | Status: DC | PRN
  Filled 2015-12-22: qty 10

## 2015-12-22 MED ORDER — HEPARIN SOD (PORK) LOCK FLUSH 100 UNIT/ML IV SOLN
INTRAVENOUS | Status: AC
  Filled 2015-12-22: qty 5

## 2015-12-22 MED ORDER — RANITIDINE HCL 1000 MG/40ML IJ SOLN
50.0000 mg | Freq: Once | INTRAMUSCULAR | Status: DC
Start: 2015-12-22 — End: 2015-12-22

## 2015-12-22 MED ORDER — HEPARIN SOD (PORK) LOCK FLUSH 100 UNIT/ML IV SOLN
500.0000 [IU] | Freq: Once | INTRAVENOUS | Status: AC
  Administered 2015-12-22: 500 [IU] via INTRAVENOUS

## 2015-12-24 NOTE — Progress Notes (Signed)
[name]  [phone] [phone]  ID: Erik Shelton OB: Jun 03, 1941  MR#: [id number]  [id number]  Patient Care Team: [name], MD as PCP - General (Internal Medicine)  CHIEF COMPLAINT:  Chief Complaint  Patient presents with  . ER follow up    INTERVAL HISTORY: Patient returns to clinic today for ER follow-up. Patient presented to his primary care's office recently and was found to be significantly tachycardic with a heart rate of 140. He was also having increased abdominal pain causing him to be persistently nauseous and have a poor appetite. He also complains of occasional chest pain that is worse with eating. He has no neurologic complaints. He denies any constipation or diarrhea. 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 Patient feels generally terrible, but offers no further specific complaints.    REVIEW OF SYSTEMS:   Review of Systems  Constitutional: Negative for fever, positive for malaise/fatigue. Positive for weight loss. Respiratory: Negative for cough, wheezing.  Positive for shortness of breath. Cardiovascular: Negative.   Gastrointestinal: Negative for nausea and abdominal pain and bloating.  Musculoskeletal: Positive for weakness.   Neurological: Positive for sensory change.   Endo/Heme/Allergies: Does not bruise/bleed easily.   As per HPI. Otherwise, a complete review of systems is negatve.  PAST MEDICAL HISTORY: Past Medical History  Diagnosis Date  . Stroke ([place])   . Seizures ([place])   . Hypercholesteremia   . HTN (hypertension)   . Cancer ([place])     T-Cell Lymphoma  . Seizures ([place])     PAST SURGICAL HISTORY: Past Surgical History  Procedure Laterality Date  . Peripheral vascular catheterization N/A 04/24/2015    Procedure: Glori Luis Cath Insertion;  Surgeon: Algernon Huxley, MD;  Location: [facility];  Service: Cardiovascular;  Laterality: N/A;  . Portacath placement      FAMILY  HISTORY: Reviewed and unchanged. No reported history of malignancy or chronic disease.     ADVANCED DIRECTIVES:    HEALTH MAINTENANCE: Social History  Substance Use Topics  . Smoking status: Former Research scientist (life sciences)  . Smokeless tobacco: Not on file     Comment: Quit  in 1990  . Alcohol Use: Yes     Comment: Occassional glass of wine- none for over a year     No Known Allergies  Current Outpatient Prescriptions  Medication Sig Dispense Refill  . acetaminophen (TYLENOL) 325 MG tablet Take 650 mg by mouth every 6 (six) hours as needed for mild pain, moderate pain, fever or headache.    Marland Kitchen acidophilus (RISAQUAD) CAPS capsule Take 1 capsule by mouth 2 (two) times daily. 20 capsule 0  . Albuterol Sulfate 108 (90 Base) MCG/ACT AEPB Inhale 1 puff into the lungs every 6 (six) hours as needed. 1 each 12  . aspirin EC 325 MG tablet Take 325 mg by mouth daily.    Marland Kitchen dexamethasone (DECADRON) 4 MG tablet Take 1 tablet (4 mg total) by mouth 2 (two) times daily with a meal. Until 11/22/15.  Then take 1 tablet daily x7 days, then 1 tablet every other day until finished 20 tablet 0  . lidocaine-prilocaine (EMLA) cream Apply 1 application topically as needed. 30 g 2  . losartan (COZAAR) 50 MG tablet Take 50 mg by mouth daily.    . ondansetron (ZOFRAN) 4 MG tablet Take 1 tablet (4 mg total) by mouth every 8 (eight) hours as needed for nausea. 30 tablet 1  . oxyCODONE-acetaminophen (ROXICET) 5-325 MG tablet Take 1 tablet  by mouth every 4 (four) hours as needed for severe pain. 60 tablet 0  . potassium chloride SA (K-DUR,KLOR-CON) 20 MEQ tablet Take 1 tablet (20 mEq total) by mouth 2 (two) times daily. 60 tablet 3  . pravastatin (PRAVACHOL) 80 MG tablet Take 80 mg by mouth daily.    . diphenoxylate-atropine (LOMOTIL) 2.5-0.025 MG tablet Take 1 tablet by mouth 4 (four) times daily as needed for diarrhea or loose stools. 30 tablet 0  . omeprazole (PRILOSEC) 40 MG capsule Take 1 capsule (40 mg total) by mouth daily.  (Patient not taking: Reported on 12/18/2015) 90 capsule 2   No current facility-administered medications for this visit.    OBJECTIVE: There were no vitals filed for this visit.   There is no weight on file to calculate BMI.    ECOG FS:2 - Symptomatic, <50% confined to bed  General: Well-developed, well-nourished, no acute distress. Eyes: Pink conjunctiva, anicteric sclera. Lungs: Clear to auscultation bilaterally. Heart: Regular rate and rhythm. No rubs, murmurs, or gallops. Abdomen: Soft, nontender, distended. No organomegaly noted, normoactive bowel sounds. Musculoskeletal: No edema, cyanosis, or clubbing. Neuro: Alert, answering all questions appropriately. Cranial nerves grossly intact. Skin: No rashes or petechiae noted. Psych: Normal affect. Lymphatics: No palpable lymphadenopathy.   LAB RESULTS:  Lab Results  Component Value Date   NA 134* 12/15/2015   K 3.6 12/15/2015   CL 98* 12/15/2015   CO2 26 12/15/2015   GLUCOSE 245* 12/15/2015   BUN 14 12/15/2015   CREATININE 0.67 12/15/2015   CALCIUM 8.5* 12/15/2015   PROT 6.1* 12/15/2015   ALBUMIN 2.9* 12/15/2015   AST 42* 12/15/2015   ALT 45 12/15/2015   ALKPHOS 162* 12/15/2015   BILITOT 0.4 12/15/2015   GFRNONAA >60 12/15/2015   GFRAA >60 12/15/2015    Lab Results  Component Value Date   WBC 21.9* 12/15/2015   NEUTROABS 19.9* 12/15/2015   HGB 9.7* 12/15/2015   HCT 30.9* 12/15/2015   MCV 84.5 12/15/2015   PLT 285 12/15/2015     STUDIES: Dg Chest 2 View  12/05/2015  CLINICAL DATA:  Weakness and diarrhea for 2 days. The patient is receiving chemotherapy for lymphoma. EXAM: CHEST  2 VIEW COMPARISON:  PA and lateral chest 11/22/2015.  CT chest 10/24/2015. FINDINGS: Port-A-Cath is in place. There is some mild right middle lobe atelectasis. Mediastinal and hilar lymphadenopathy is identified. No pneumothorax or pleural effusion. IMPRESSION: No acute abnormality. Lymphadenopathy without marked change since the prior  plain films. Electronically Signed   By: Inge Rise M.D.   On: 12/05/2015 19:19   Ct Angio Chest Pe W/cm &/or Wo Cm  12/15/2015  CLINICAL DATA:  Increasing shortness of breath, tachycardia. Abdomen and pelvic pain. History of T-cell lymphoma. EXAM: CT ANGIOGRAPHY CHEST CT ABDOMEN AND PELVIS WITH CONTRAST TECHNIQUE: Multidetector CT imaging of the chest was performed using the standard protocol during bolus administration of intravenous contrast. Multiplanar CT image reconstructions and MIPs were obtained to evaluate the vascular anatomy. Multidetector CT imaging of the abdomen and pelvis was performed using the standard protocol during bolus administration of intravenous contrast. CONTRAST:  100 cc Isovue 370 IV COMPARISON:  12/05/2015 CT of the abdomen and pelvis. PET CT 10/20/2015 FINDINGS: CTA CHEST FINDINGS Mediastinum/Nodes: Heart is borderline enlarged. Scattered coronary artery calcifications. No filling defects in the pulmonary arteries to suggest pulmonary emboli. There is bilateral hilar, mediastinal and bilateral axillary adenopathy. Index left axillary lymph node measures 1.6 cm, stable since prior PET CT. Index right axillary lymph  node has a short axis diameter of 1.6 cm. When this is measured in the same planes an at the same level as prior study, this is not significantly changed, measuring 1.5 cm previously. Right paratracheal lymph node has a short axis diameter of 1.4 cm compared to 2.1 cm previously. Right hilar lymph node has a short axis diameter of 1.4 cm on image 54, likely not significantly changed. Left hilar lymph node has a short axis diameter of 1.5 cm on image 44, likely not significantly changed. Lungs/Pleura: There is bronchial wall thickening and mucous plugging noted in the lower lobe airways bilaterally, right greater than left. Patchy ground-glass nodular and airspace density in the right lower lobe likely reflects have inflammation or early bronchopneumonia. Scarring in  the right middle lobe and lingula. Scattered nodular densities in both upper lobes. 4 mm nodule in the left upper lobe on image 22. 5 mm nodule peripherally in the right upper lobe on image 40. Other similarly sized or smaller upper lobe nodules. No pleural effusions. Musculoskeletal: Chest wall soft tissues are unremarkable. CT ABDOMEN and PELVIS FINDINGS Hepatobiliary: No focal hepatic abnormality. Gallbladder unremarkable. Pancreas: Normal appearance Spleen: Normal appearance Adrenals/Urinary Tract: Bilateral renal cysts which appear benign, the largest on the right measuring 6.7 cm. No hydronephrosis. Adrenal glands and urinary bladder are unremarkable. Stomach/Bowel: Sigmoid diverticulosis. Previously seen sigmoid wall thickening and surrounding inflammatory change have resolved. No current evidence of active diverticulitis. Stomach and small bowel are decompressed and unremarkable. Appendix is normal. Vascular/Lymphatic: Aorta is normal caliber with scattered calcifications. No adenopathy in the abdomen or pelvis. Small bilateral inguinal lymph nodes are noted, stable. Reproductive: Grossly unremarkable Other: No free fluid or free air. Bilateral inguinal hernias containing fat. Musculoskeletal: No acute bony abnormality or focal bone lesion. Review of the MIP images confirms the above findings. IMPRESSION: No evidence of pulmonary embolus. Bilateral hilar, axillary and mediastinal adenopathy essentially stable since prior PET CT. Bronchial wall thickening and mucous plugging in the lower lobes bilaterally. Patchy ground-glass airspace disease in the right lower lobe could reflect early bronchopneumonia. Scattered nodular densities in the upper lobes bilaterally are new since prior PET CT. These could be inflammatory. Recommend follow-up CT in 3 months to ensure resolution. Resolution of the previously seen diverticulitis changes around the sigmoid colon. Diverticulosis changes again noted without active  inflammation currently. No acute findings in the abdomen or pelvis. Electronically Signed   By: Rolm Baptise M.D.   On: 12/15/2015 15:03   Ct Abdomen Pelvis W Contrast  12/15/2015  CLINICAL DATA:  Increasing shortness of breath, tachycardia. Abdomen and pelvic pain. History of T-cell lymphoma. EXAM: CT ANGIOGRAPHY CHEST CT ABDOMEN AND PELVIS WITH CONTRAST TECHNIQUE: Multidetector CT imaging of the chest was performed using the standard protocol during bolus administration of intravenous contrast. Multiplanar CT image reconstructions and MIPs were obtained to evaluate the vascular anatomy. Multidetector CT imaging of the abdomen and pelvis was performed using the standard protocol during bolus administration of intravenous contrast. CONTRAST:  100 cc Isovue 370 IV COMPARISON:  12/05/2015 CT of the abdomen and pelvis. PET CT 10/20/2015 FINDINGS: CTA CHEST FINDINGS Mediastinum/Nodes: Heart is borderline enlarged. Scattered coronary artery calcifications. No filling defects in the pulmonary arteries to suggest pulmonary emboli. There is bilateral hilar, mediastinal and bilateral axillary adenopathy. Index left axillary lymph node measures 1.6 cm, stable since prior PET CT. Index right axillary lymph node has a short axis diameter of 1.6 cm. When this is measured in the same planes an  at the same level as prior study, this is not significantly changed, measuring 1.5 cm previously. Right paratracheal lymph node has a short axis diameter of 1.4 cm compared to 2.1 cm previously. Right hilar lymph node has a short axis diameter of 1.4 cm on image 54, likely not significantly changed. Left hilar lymph node has a short axis diameter of 1.5 cm on image 44, likely not significantly changed. Lungs/Pleura: There is bronchial wall thickening and mucous plugging noted in the lower lobe airways bilaterally, right greater than left. Patchy ground-glass nodular and airspace density in the right lower lobe likely reflects have  inflammation or early bronchopneumonia. Scarring in the right middle lobe and lingula. Scattered nodular densities in both upper lobes. 4 mm nodule in the left upper lobe on image 22. 5 mm nodule peripherally in the right upper lobe on image 40. Other similarly sized or smaller upper lobe nodules. No pleural effusions. Musculoskeletal: Chest wall soft tissues are unremarkable. CT ABDOMEN and PELVIS FINDINGS Hepatobiliary: No focal hepatic abnormality. Gallbladder unremarkable. Pancreas: Normal appearance Spleen: Normal appearance Adrenals/Urinary Tract: Bilateral renal cysts which appear benign, the largest on the right measuring 6.7 cm. No hydronephrosis. Adrenal glands and urinary bladder are unremarkable. Stomach/Bowel: Sigmoid diverticulosis. Previously seen sigmoid wall thickening and surrounding inflammatory change have resolved. No current evidence of active diverticulitis. Stomach and small bowel are decompressed and unremarkable. Appendix is normal. Vascular/Lymphatic: Aorta is normal caliber with scattered calcifications. No adenopathy in the abdomen or pelvis. Small bilateral inguinal lymph nodes are noted, stable. Reproductive: Grossly unremarkable Other: No free fluid or free air. Bilateral inguinal hernias containing fat. Musculoskeletal: No acute bony abnormality or focal bone lesion. Review of the MIP images confirms the above findings. IMPRESSION: No evidence of pulmonary embolus. Bilateral hilar, axillary and mediastinal adenopathy essentially stable since prior PET CT. Bronchial wall thickening and mucous plugging in the lower lobes bilaterally. Patchy ground-glass airspace disease in the right lower lobe could reflect early bronchopneumonia. Scattered nodular densities in the upper lobes bilaterally are new since prior PET CT. These could be inflammatory. Recommend follow-up CT in 3 months to ensure resolution. Resolution of the previously seen diverticulitis changes around the sigmoid colon.  Diverticulosis changes again noted without active inflammation currently. No acute findings in the abdomen or pelvis. Electronically Signed   By: Rolm Baptise M.D.   On: 12/15/2015 15:03   Ct Abdomen Pelvis W Contrast  12/05/2015  CLINICAL DATA:  Generalized weakness and diffuse abdominal pain with nausea and diarrhea for the past 2 days. EXAM: CT ABDOMEN AND PELVIS WITH CONTRAST TECHNIQUE: Multidetector CT imaging of the abdomen and pelvis was performed using the standard protocol following bolus administration of intravenous contrast. CONTRAST:  124mL OMNIPAQUE IOHEXOL 300 MG/ML  SOLN COMPARISON:  11/22/2015 FINDINGS: Lower chest: Bibasilar atelectasis but no effusions or worrisome pulmonary lesions. Stable bilateral low-attenuation right infrahilar lymph nodes. Hepatobiliary: No focal hepatic lesions or intrahepatic biliary dilatation. The gallbladder is normal. No common bile duct dilatation. Pancreas: No mass, inflammation or ductal dilatation. Spleen: Normal size.  No focal lesions. Adrenals/Urinary Tract: The adrenal glands are normal in stable. Stable large bilateral renal cysts. No worrisome renal lesions or hydronephrosis. Stomach/Bowel: The stomach, duodenum and small bowel are unremarkable. No inflammatory changes, mass lesions or obstructive findings. The terminal ileum is normal. The appendix is normal. Slight worsening of diverticulitis involving the upper sigmoid colon. No complicating features such as abscess or free air. The remainder of the colon is unremarkable. Moderate stool throughout.  Vascular/Lymphatic: No mesenteric or retroperitoneal mass or adenopathy. Small scattered lymph nodes are stable. The aorta and branch vessels are patent. The major venous structures are patent. Other: No pelvic mass or adenopathy. No free pelvic fluid collections. The bladder appears normal. The prostate gland and seminal vesicles are unremarkable. Stable prominent bilateral inguinal rings containing fat.  Stable scattered inguinal lymph nodes. Musculoskeletal: No significant bony findings. IMPRESSION: 1. Progressive findings of acute diverticulitis involving the upper sigmoid colon. No abscess or free air. 2. Stable bilateral renal cysts and very prominent para renal/retroperitoneal fat. Electronically Signed   By: Marijo Sanes M.D.   On: 12/05/2015 21:37   Dg Abd Acute W/chest  12/15/2015  CLINICAL DATA:  Shortness of breath. Abdominal pain. Tachycardia. Persistent weakness after discharge from hospital. EXAM: DG ABDOMEN ACUTE W/ 1V CHEST COMPARISON:  CT abdomen and pelvis 12/05/2015. FINDINGS: The heart is mildly enlarged. A right IJ Port-A-Cath is stable. The lungs are clear. The bowel gas pattern is normal. Calcifications in the anatomic pelvis compatible with phleboliths are stable. Gas and stool are within normal limits. The axial skeleton is unremarkable. IMPRESSION: No acute cardiopulmonary disease or acute abdominal process. Stable cardiomegaly without failure. Electronically Signed   By: San Morelle M.D.   On: 12/15/2015 12:11   ONCOLOGIC TREATMENT HISTORY: Patient received 3 cycles of CHOP between January and March 2014 achieving a complete remission. Upon recurrence, patient received 4 cycle CHOP plus etoposide between August and October 2016. Patient received his lifetime dose of Adriamycin which was dropped from his regimen and he received 4 additional cycles of etoposide plus CVP from November 2016 through February 2017.  ASSESSMENT: Recurrent stage IV T-cell lymphoma.  PLAN:    1. T-cell lymphoma: CT scan results from hospital visit reviewed independently and is without evidence of progression of disease, including recent CT of chest. Patient's last infusion of gemcitabine and oxaliplatin was given on December 14, 2015. He has been instructed to keep his previously scheduled follow-up appointment for consideration of cycle 3, day 15.  Will continue with Onpro Neulasta as well.  2.  Leukocytosis: Secondary to OnPro Neulasta as above. 3. Anemia: Hemoglobin stable at 9.7. Secondary to chemotherapy, monitor. 4. Hypokalemia: Continue oral potassium supplementation. 5. Abdominal pain/nausea/shortness of breath: Possibly secondary to reflux. Have increased patient's omeprazole to twice a day and patient will receive IV fluids along with IV ranitidine Monday, Wednesday, and Friday this week. 6. Shortness of breath: Continue albuterol. Patient had consultation with radiation oncology for XRT for symptomatic treatment. 7. Nausea: Patient to use ondansetron PRN. Treatment per reflux as above. 8. 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 Monitor. 9. Depression: Improved. Continue Celexa 10 mg daily. 10. Weight loss: Secondary to altered taste. Patient to continue with Boost and oral intake as often as tolerated.  11: Hypotension/tachycardia: Likely secondary dehydration. IV fluids as above. Patient was also given a referral to cardiology.   Patient expressed understanding and was in agreement with this plan. He also understands that He can call clinic at any time with any questions, concerns, or complaints.   Erik Huger, MD   12/24/2015 10:56 AM

## 2015-12-25 ENCOUNTER — Ambulatory Visit
Admission: RE | Admit: 2015-12-25 | Discharge: 2015-12-25 | Disposition: A | Discharge status: Home / Self Care | Payer: Medicare Other | Source: Ambulatory Visit | Attending: Radiation Oncology | Admitting: Radiation Oncology

## 2015-12-25 ENCOUNTER — Encounter: Payer: Self-pay | Admitting: Radiation Oncology

## 2015-12-25 VITALS — BP 125/89 | HR 114 | Temp 98.0°F | Wt 179.7 lb

## 2015-12-25 DIAGNOSIS — C8448 Peripheral T-cell lymphoma, not classified, lymph nodes of multiple sites: Secondary | ICD-10-CM

## 2015-12-25 NOTE — Progress Notes (Signed)
Radiation Oncology Follow up Note  Name: Erik Shelton   Date:   12/25/2015 MRN:  [id number] DOB: 01-08-1941    This 75 y.o. male presents to the clinic today for follow-up for palliative radiation therapy to chest for progression of malignant lymphoma peripheral T-cell stage IV.  REFERRING PROVIDER: [name], MD  HPI: Patient is a 75 year old male with extensive peripheral T-cell lymphoma recently treated to his bilateral hilar regions for progressive lymphoma. He is now seen 1 month out.Marland Kitchen He continues to be weak have fatigue and recently was found to be tachycardic with a heart rate of 140. He also has poor appetite persistent nausea. Recent CT scan shows no progression and chest indicative of response to radiation therapy and palliation. He continues on close follow-up care with medical oncology. Patient has several episodes of dehydration last week and was treated with fluids in the infusion suite.  COMPLICATIONS OF TREATMENT: none  FOLLOW UP COMPLIANCE: keeps appointments   PHYSICAL EXAM:  BP 125/89 mmHg  Pulse 114  Temp(Src) 98 F (36.7 C)  Wt 179 lb 10.8 oz (81.5 kg) Week frail-appearing male wheelchair-bound in NAD. Well-developed well-nourished patient in NAD. HEENT reveals PERLA, EOMI, discs not visualized.  Oral cavity is clear. No oral mucosal lesions are identified. Neck is clear without evidence of cervical or supraclavicular adenopathy. Lungs are clear to A&P. Cardiac examination is essentially unremarkable with regular rate and rhythm without murmur rub or thrill. Abdomen is benign with no organomegaly or masses noted. Motor sensory and DTR levels are equal and symmetric in the upper and lower extremities. Cranial nerves II through XII are grossly intact. Proprioception is intact. No peripheral adenopathy or edema is identified. No motor or sensory levels are noted. Crude visual fields are within normal range.  RADIOLOGY RESULTS: Recent CT scan is reviewed  showing good response in the hilar areas previously treated with palliative radiation therapy  PLAN: Present time patient is declining most likely secondary doing to progressive stage IV peripheral T-cell lymphoma. He'll continue close follow-up care with medical oncology. See no other areas for palliative benefit at this point in time. I've asked to see him back in 4 months for follow-up. Patient family know to call sooner with any concerns.  I would like to take this opportunity for allowing me to participate in the care of your patient.Armstead Peaks., MD

## 2015-12-27 ENCOUNTER — Ambulatory Visit: Payer: Medicare Other | Admitting: Cardiology

## 2015-12-28 ENCOUNTER — Inpatient Hospital Stay (HOSPITAL_BASED_OUTPATIENT_CLINIC_OR_DEPARTMENT_OTHER): Payer: Medicare Other | Admitting: Internal Medicine

## 2015-12-28 ENCOUNTER — Inpatient Hospital Stay: Payer: Medicare Other

## 2015-12-28 ENCOUNTER — Other Ambulatory Visit: Payer: Self-pay | Admitting: *Deleted

## 2015-12-28 ENCOUNTER — Other Ambulatory Visit: Payer: Self-pay

## 2015-12-28 VITALS — BP 108/77 | HR 139 | Temp 98.4°F | Resp 18 | Wt 179.9 lb

## 2015-12-28 DIAGNOSIS — C8448 Peripheral T-cell lymphoma, not classified, lymph nodes of multiple sites: Secondary | ICD-10-CM

## 2015-12-28 DIAGNOSIS — R000 Tachycardia, unspecified: Secondary | ICD-10-CM

## 2015-12-28 DIAGNOSIS — R531 Weakness: Secondary | ICD-10-CM | POA: Diagnosis not present

## 2015-12-28 DIAGNOSIS — I959 Hypotension, unspecified: Secondary | ICD-10-CM | POA: Diagnosis not present

## 2015-12-28 DIAGNOSIS — Z79899 Other long term (current) drug therapy: Secondary | ICD-10-CM

## 2015-12-28 DIAGNOSIS — T451X5S Adverse effect of antineoplastic and immunosuppressive drugs, sequela: Secondary | ICD-10-CM

## 2015-12-28 DIAGNOSIS — C8442 Peripheral T-cell lymphoma, not classified, intrathoracic lymph nodes: Secondary | ICD-10-CM

## 2015-12-28 DIAGNOSIS — G8929 Other chronic pain: Secondary | ICD-10-CM

## 2015-12-28 DIAGNOSIS — G620 Drug-induced polyneuropathy: Secondary | ICD-10-CM

## 2015-12-28 DIAGNOSIS — Z87891 Personal history of nicotine dependence: Secondary | ICD-10-CM

## 2015-12-28 DIAGNOSIS — R52 Pain, unspecified: Secondary | ICD-10-CM

## 2015-12-28 DIAGNOSIS — Z9221 Personal history of antineoplastic chemotherapy: Secondary | ICD-10-CM

## 2015-12-28 DIAGNOSIS — Z7982 Long term (current) use of aspirin: Secondary | ICD-10-CM

## 2015-12-28 LAB — CBC WITH DIFFERENTIAL/PLATELET
BASOS ABS: 0 10*3/uL (ref 0–0.1)
Basophils Relative: 0 %
Eosinophils Absolute: 0.1 10*3/uL (ref 0–0.7)
Eosinophils Relative: 0 %
HEMATOCRIT: 25.2 % — AB (ref 40.0–52.0)
Hemoglobin: 8.1 g/dL — ABNORMAL LOW (ref 13.0–18.0)
Lymphocytes Relative: 7 %
Lymphs Abs: 1.9 10*3/uL (ref 1.0–3.6)
MCH: 26.9 pg (ref 26.0–34.0)
MCHC: 32.1 g/dL (ref 32.0–36.0)
MCV: 83.8 fL (ref 80.0–100.0)
MONOS PCT: 7 %
Monocytes Absolute: 1.9 10*3/uL — ABNORMAL HIGH (ref 0.2–1.0)
NEUTROS PCT: 86 %
Neutro Abs: 23.7 10*3/uL — ABNORMAL HIGH (ref 1.4–6.5)
Platelets: 195 10*3/uL (ref 150–440)
RBC: 3.01 MIL/uL — ABNORMAL LOW (ref 4.40–5.90)
RDW: 23 % — AB (ref 11.5–14.5)
WBC: 27.6 10*3/uL — AB (ref 3.8–10.6)

## 2015-12-28 LAB — COMPREHENSIVE METABOLIC PANEL
ALK PHOS: 186 U/L — AB (ref 38–126)
ALT: 26 U/L (ref 17–63)
AST: 25 U/L (ref 15–41)
Albumin: 2.9 g/dL — ABNORMAL LOW (ref 3.5–5.0)
Anion gap: 6 (ref 5–15)
BILIRUBIN TOTAL: 0.6 mg/dL (ref 0.3–1.2)
BUN: 8 mg/dL (ref 6–20)
CALCIUM: 8.1 mg/dL — AB (ref 8.9–10.3)
CO2: 27 mmol/L (ref 22–32)
CREATININE: 0.67 mg/dL (ref 0.61–1.24)
Chloride: 102 mmol/L (ref 101–111)
GFR calc Af Amer: >60 mL/min (ref >60)
Glucose, Bld: 148 mg/dL — ABNORMAL HIGH (ref 65–99)
POTASSIUM: 4 mmol/L (ref 3.5–5.1)
Sodium: 135 mmol/L (ref 135–145)
TOTAL PROTEIN: 5.9 g/dL — AB (ref 6.5–8.1)

## 2015-12-28 LAB — TSH: TSH: 2.787 u[IU]/mL (ref 0.350–4.500)

## 2015-12-28 MED ORDER — HEPARIN SOD (PORK) LOCK FLUSH 100 UNIT/ML IV SOLN
500.0000 [IU] | Freq: Once | INTRAVENOUS | Status: AC
  Administered 2015-12-28: 500 [IU] via INTRAVENOUS

## 2015-12-28 MED ORDER — HEPARIN SOD (PORK) LOCK FLUSH 100 UNIT/ML IV SOLN
INTRAVENOUS | Status: AC
  Filled 2015-12-28: qty 5

## 2015-12-28 MED ORDER — METOPROLOL TARTRATE 25 MG PO TABS: 25.0000 mg | ORAL_TABLET | Freq: Two times a day (BID) | ORAL | Status: AC

## 2015-12-28 MED ORDER — OXYCODONE-ACETAMINOPHEN 5-325 MG PO TABS: 1.0000 | ORAL_TABLET | ORAL | Status: DC | PRN

## 2015-12-28 NOTE — Progress Notes (Signed)
Patient complains of weakness and SOB.  Patient requesting refill for Oxycodone.

## 2015-12-28 NOTE — Progress Notes (Signed)
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  Patient missed his cardiology appointment in the interim because of transportation issues.  Overall patient feels tired. Denies any palpitations. Denies any chest pain. However continues to feel weak all over. Complains of pain all over.  States his diarrhea is resolved. Does complain of abdominal cramping intermittently. No fever no chills. Mild tingling and numbness in his feet. His appetite is fair.   REVIEW OF SYSTEMS:  A complete 10 point review of system is done which is negative except mentioned above/history of present illness.   PAST MEDICAL HISTORY :  Past Medical History  Diagnosis Date  . Stroke ([place])   . Seizures ([place])   . Hypercholesteremia   . HTN (hypertension)   . Cancer ([place])     T-Cell Lymphoma  . Seizures ([place])     PAST SURGICAL HISTORY :   Past Surgical History  Procedure Laterality Date  . Peripheral vascular catheterization N/A 04/24/2015    Procedure: Glori Luis Cath Insertion;  Surgeon: Algernon Huxley, MD;  Location: [facility];  Service: Cardiovascular;  Laterality: N/A;  . Portacath placement      FAMILY HISTORY :  No family history on file.  SOCIAL HISTORY:   Social History  Substance Use Topics  . Smoking status: Former Research scientist (life sciences)  . Smokeless tobacco: Not on file     Comment: Quit  in 1990  . Alcohol Use: Yes     Comment: Occassional glass of wine- none for over a year    ALLERGIES:  has No Known Allergies.  MEDICATIONS:  Current Outpatient Prescriptions  Medication Sig Dispense  Refill  . acetaminophen (TYLENOL) 325 MG tablet Take 650 mg by mouth every 6 (six) hours as needed for mild pain, moderate pain, fever or headache.    Marland Kitchen acidophilus (RISAQUAD) CAPS capsule Take 1 capsule by mouth 2 (two) times daily. 20 capsule 0  . Albuterol Sulfate 108 (90 Base) MCG/ACT AEPB Inhale 1 puff into the lungs every 6 (six) hours as needed. 1 each 12  . aspirin EC 325 MG tablet Take 325 mg by mouth daily.    Marland Kitchen dexamethasone (DECADRON) 4 MG tablet Take 1 tablet (4 mg total) by mouth 2 (two) times daily with a meal. Until 11/22/15.  Then take 1 tablet daily x7 days, then 1 tablet every other day until finished 20 tablet 0  . diphenoxylate-atropine (LOMOTIL) 2.5-0.025 MG tablet Take 1 tablet by mouth 4 (four) times daily as needed for diarrhea or loose stools. 30 tablet 0  . lidocaine-prilocaine (EMLA) cream Apply 1 application topically as needed. 30 g 2  . losartan (COZAAR) 50 MG tablet Take 50 mg by mouth daily.    Marland Kitchen omeprazole (PRILOSEC) 40 MG capsule Take 1 capsule (40 mg total) by mouth daily. 90 capsule 2  . ondansetron (ZOFRAN) 4 MG tablet Take 1 tablet (4 mg total) by mouth every 8 (eight) hours as needed for nausea. 30 tablet 1  . oxyCODONE-acetaminophen (ROXICET) 5-325 MG tablet Take 1 tablet by mouth every 4 (four) hours as needed for severe pain. 60 tablet 0  . potassium  chloride SA (K-DUR,KLOR-CON) 20 MEQ tablet Take 1 tablet (20 mEq total) by mouth 2 (two) times daily. 60 tablet 3  . pravastatin (PRAVACHOL) 80 MG tablet Take 80 mg by mouth daily.     No current facility-administered medications for this visit.    PHYSICAL EXAMINATION: ECOG PERFORMANCE STATUS: 3 - Symptomatic, >50% confined to bed  BP 108/77 mmHg  Pulse 139  Temp(Src) 98.4 F (36.9 C) (Tympanic)  Resp 18  Wt 179 lb 14.3 oz (81.6 kg)  Filed Weights   12/28/15 0851  Weight: 179 lb 14.3 oz (81.6 kg)    GENERAL: Well-nourished well-developed; Alert, no distress and comfortable. He feels tired.  In a  wheel chair. Accompanied by his wife. EYES: Positive for pallor. OROPHARYNX: no thrush or ulceration.  NECK: supple, no masses felt LYMPH:  no palpable lymphadenopathy in the cervical, axillary regions. ingiunal areas cannot be examined.  LUNGS: clear to auscultation and  No wheeze or crackles HEART/CVS: regular rate & rhythm and no murmurs; 1+ bilateral lower extremity edema ABDOMEN:abdomen soft, non-tender and normal bowel sounds Musculoskeletal:no cyanosis of digits and no clubbing  PSYCH: alert & oriented x 3 with fluent speech NEURO: no focal motor/sensory deficits SKIN:  no rashes or significant lesions  LABORATORY DATA:  I have reviewed the data as listed    Component Value Date/Time   NA 135 12/28/2015 0831   NA 139 12/09/2014 0940   K 4.0 12/28/2015 0831   K 4.4 12/09/2014 0940   CL 102 12/28/2015 0831   CL 105 12/09/2014 0940   CO2 27 12/28/2015 0831   CO2 29 12/09/2014 0940   GLUCOSE 148* 12/28/2015 0831   GLUCOSE 121* 12/09/2014 0940   BUN 8 12/28/2015 0831   BUN 23* 12/09/2014 0940   CREATININE 0.67 12/28/2015 0831   CREATININE 1.08 12/09/2014 0940   CALCIUM 8.1* 12/28/2015 0831   CALCIUM 9.3 12/09/2014 0940   PROT 5.9* 12/28/2015 0831   PROT 7.1 12/28/2012 0826   ALBUMIN 2.9* 12/28/2015 0831   ALBUMIN 3.2* 12/28/2012 0826   AST 25 12/28/2015 0831   AST 17 12/28/2012 0826   ALT 26 12/28/2015 0831   ALT 28 12/28/2012 0826   ALKPHOS 186* 12/28/2015 0831   ALKPHOS 90 12/28/2012 0826   BILITOT 0.6 12/28/2015 0831   BILITOT 0.2 12/28/2012 0826   GFRNONAA >60 12/28/2015 0831   GFRNONAA >60 12/09/2014 0940   GFRNONAA >60 12/01/2011 0514   GFRAA >60 12/28/2015 0831   GFRAA >60 12/09/2014 0940   GFRAA >60 12/01/2011 0514    No results found for: SPEP, UPEP  Lab Results  Component Value Date   WBC 27.6* 12/28/2015   NEUTROABS 23.7* 12/28/2015   HGB 8.1* 12/28/2015   HCT 25.2* 12/28/2015   MCV 83.8 12/28/2015   PLT 195 12/28/2015      Chemistry       Component Value Date/Time   NA 135 12/28/2015 0831   NA 139 12/09/2014 0940   K 4.0 12/28/2015 0831   K 4.4 12/09/2014 0940   CL 102 12/28/2015 0831   CL 105 12/09/2014 0940   CO2 27 12/28/2015 0831   CO2 29 12/09/2014 0940   BUN 8 12/28/2015 0831   BUN 23* 12/09/2014 0940   CREATININE 0.67 12/28/2015 0831   CREATININE 1.08 12/09/2014 0940      Component Value Date/Time   CALCIUM 8.1* 12/28/2015 0831   CALCIUM 9.3 12/09/2014 0940   ALKPHOS 186* 12/28/2015 0831   ALKPHOS 90 12/28/2012 0826  AST 25 12/28/2015 0831   AST 17 12/28/2012 0826   ALT 26 12/28/2015 0831   ALT 28 12/28/2012 0826   BILITOT 0.6 12/28/2015 0831   BILITOT 0.2 12/28/2012 0826         ASSESSMENT & PLAN:   # Relapsed T cell lymphoma- on palliative gemcitabine/oxaliplatin. Status post cycle #3- CT chest abdomen pelvis showed improved/stable mediastinal adenopathy axillary adenopathy. About 1-2 cm in size.  Patient tolerating chemotherapy with moderate difficulties [C discussion below]  # HOLD chemotherapy today [patient tachycardic 140 heart rate- see discussion below]. Reevaluate in 2 weeks for chemotherapy again. Discussed with the patient and wife in detail.  # Leukocytosis 27,000 likely from Neulasta. Hemoglobin 8.1. Platelets normal. No signs of infection.  # Tachycardia data EKG today-Sinus tachycardia arrythmia noted.Question adrenal insufficiency-Check cortisol/TSH.Marland Kitchen No concerns for PE. Patient missed cardiology appointment last week. Recommend making an appointment as as possible. Recommend discontinuation of Cozaar start; metoprolol 25 mg twice a day. New prescription given.  # Chronic pain ? malignancy- recommend Percocet; new prescription given.  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  # Patient follow-up with Dr. Grayland Ormond in approximately 2 weeks labs/chemotherapy.     Cammie Sickle, MD 12/28/2015 9:14 AM

## 2016-01-03 ENCOUNTER — Telehealth: Payer: Self-pay | Admitting: *Deleted

## 2016-01-03 DIAGNOSIS — C8448 Peripheral T-cell lymphoma, not classified, lymph nodes of multiple sites: Secondary | ICD-10-CM

## 2016-01-03 NOTE — Telephone Encounter (Signed)
Called to report that since Sat he has had a lump in bilateral axilla which is painful and seems to change in size. When the pain hits, it causes him to gasp for air and he begins wheezing.He also feels weak. Denies vomiting or fever. Reports that he is having daily bowel movements, but states that he is constipated, having a hard time getting stool out. He had diarrhea 2 weeks ago and so is asking if he can take anything for constipation. Please advise

## 2016-01-03 NOTE — Telephone Encounter (Signed)
Per Dr Grayland Ormond [place] bil axilla, Miralax daily. Per Kermit Balo, please make the [place] for tomorrow and will need the Lucianne Lei to transport. She states she has packets of polyetheline glycol on hand will give 1 packet daily. She will await call from scheduling regarding [place].Message sent to scheduling

## 2016-01-04 ENCOUNTER — Ambulatory Visit
Admission: RE | Admit: 2016-01-04 | Discharge: 2016-01-04 | Disposition: A | Discharge status: Home / Self Care | Payer: Medicare Other | Source: Ambulatory Visit | Attending: Oncology | Admitting: Oncology

## 2016-01-04 DIAGNOSIS — Z8572 Personal history of non-Hodgkin lymphomas: Secondary | ICD-10-CM | POA: Diagnosis present

## 2016-01-04 DIAGNOSIS — R599 Enlarged lymph nodes, unspecified: Secondary | ICD-10-CM | POA: Diagnosis not present

## 2016-01-04 DIAGNOSIS — C8448 Peripheral T-cell lymphoma, not classified, lymph nodes of multiple sites: Secondary | ICD-10-CM

## 2016-01-04 NOTE — Telephone Encounter (Signed)
This is Dr.Finnegan pt- Thx

## 2016-01-08 ENCOUNTER — Ambulatory Visit
Admission: RE | Admit: 2016-01-08 | Discharge: 2016-01-08 | Disposition: A | Discharge status: Home / Self Care | Payer: Medicare Other | Source: Ambulatory Visit | Attending: Oncology | Admitting: Oncology

## 2016-01-08 ENCOUNTER — Inpatient Hospital Stay (HOSPITAL_BASED_OUTPATIENT_CLINIC_OR_DEPARTMENT_OTHER): Payer: Medicare Other | Admitting: Oncology

## 2016-01-08 VITALS — BP 106/73 | HR 103 | Temp 97.4°F | Resp 20

## 2016-01-08 DIAGNOSIS — R188 Other ascites: Secondary | ICD-10-CM | POA: Insufficient documentation

## 2016-01-08 DIAGNOSIS — K118 Other diseases of salivary glands: Secondary | ICD-10-CM | POA: Insufficient documentation

## 2016-01-08 DIAGNOSIS — C8441 Peripheral T-cell lymphoma, not classified, lymph nodes of head, face, and neck: Secondary | ICD-10-CM

## 2016-01-08 DIAGNOSIS — R599 Enlarged lymph nodes, unspecified: Secondary | ICD-10-CM | POA: Insufficient documentation

## 2016-01-08 DIAGNOSIS — T451X5S Adverse effect of antineoplastic and immunosuppressive drugs, sequela: Secondary | ICD-10-CM

## 2016-01-08 DIAGNOSIS — K769 Liver disease, unspecified: Secondary | ICD-10-CM | POA: Insufficient documentation

## 2016-01-08 DIAGNOSIS — R439 Unspecified disturbances of smell and taste: Secondary | ICD-10-CM

## 2016-01-08 DIAGNOSIS — G620 Drug-induced polyneuropathy: Secondary | ICD-10-CM

## 2016-01-08 DIAGNOSIS — I2510 Atherosclerotic heart disease of native coronary artery without angina pectoris: Secondary | ICD-10-CM | POA: Diagnosis not present

## 2016-01-08 DIAGNOSIS — M79621 Pain in right upper arm: Secondary | ICD-10-CM

## 2016-01-08 DIAGNOSIS — E876 Hypokalemia: Secondary | ICD-10-CM

## 2016-01-08 DIAGNOSIS — R634 Abnormal weight loss: Secondary | ICD-10-CM

## 2016-01-08 DIAGNOSIS — C8448 Peripheral T-cell lymphoma, not classified, lymph nodes of multiple sites: Secondary | ICD-10-CM | POA: Diagnosis not present

## 2016-01-08 DIAGNOSIS — R61 Generalized hyperhidrosis: Secondary | ICD-10-CM

## 2016-01-08 DIAGNOSIS — R0602 Shortness of breath: Secondary | ICD-10-CM

## 2016-01-08 DIAGNOSIS — R509 Fever, unspecified: Secondary | ICD-10-CM

## 2016-01-08 DIAGNOSIS — M50323 Other cervical disc degeneration at C6-C7 level: Secondary | ICD-10-CM | POA: Diagnosis not present

## 2016-01-08 DIAGNOSIS — M50322 Other cervical disc degeneration at C5-C6 level: Secondary | ICD-10-CM | POA: Insufficient documentation

## 2016-01-08 DIAGNOSIS — M79622 Pain in left upper arm: Secondary | ICD-10-CM | POA: Diagnosis not present

## 2016-01-08 DIAGNOSIS — R110 Nausea: Secondary | ICD-10-CM

## 2016-01-08 DIAGNOSIS — R5381 Other malaise: Secondary | ICD-10-CM

## 2016-01-08 DIAGNOSIS — J90 Pleural effusion, not elsewhere classified: Secondary | ICD-10-CM | POA: Insufficient documentation

## 2016-01-08 DIAGNOSIS — D649 Anemia, unspecified: Secondary | ICD-10-CM

## 2016-01-08 MED ORDER — OXYCODONE HCL 10 MG PO TABS: 10.0000 mg | ORAL_TABLET | ORAL | Status: DC | PRN

## 2016-01-08 MED ORDER — IOPAMIDOL (ISOVUE-300) INJECTION 61%
100.0000 mL | Freq: Once | INTRAVENOUS | Status: AC | PRN
  Administered 2016-01-08: 100 mL via INTRAVENOUS

## 2016-01-08 NOTE — Progress Notes (Signed)
Patient has new axillary nodules that are painful and the pain radiates down to the abdomen the pain is 10/10 on pain scale.  He has been waking up at night with cold sweats for the past 2 nights.  Does not have much of an appetite but is able to eat breakfast in the morning then small snacks during the day.

## 2016-01-08 NOTE — Progress Notes (Signed)
[place]  [phone] [phone]) [phone]  ID: Velvet Bathe OB: 01-29-1941  MR#: [id number]  [id number]  Patient Care Team: [name], MD as PCP - General (Internal Medicine)  CHIEF COMPLAINT:  Chief Complaint  Patient presents with  . Lymphoma    INTERVAL HISTORY: Patient returns to clinic today As an add-on secondary to acute pain in his bilateral axilla which also radiates down to his abdomen. Ultrasound revealed significant increase in axillary lymphadenopathy. He also has been having low-grade fevers and night sweats. He did not receive his last infusion of chemotherapy secondary to significant tachycardia. He has no neurologic complaints. He denies any constipation or diarrhea. 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 Patient feels generally terrible, but offers no further specific complaints.    REVIEW OF SYSTEMS:   Review of Systems  Constitutional: Negative for fever, positive for malaise/fatigue. Positive for weight loss. Respiratory: Negative for cough, wheezing.  Positive for shortness of breath. Cardiovascular: Negative.   Gastrointestinal: Negative for nausea and abdominal pain and bloating.  Musculoskeletal: Positive for weakness.   Neurological: Positive for sensory change.   Endo/Heme/Allergies: Does not bruise/bleed easily.   As per HPI. Otherwise, a complete review of systems is negatve.  PAST MEDICAL HISTORY: Past Medical History  Diagnosis Date  . Stroke ([place])   . Seizures ([place])   . Hypercholesteremia   . HTN (hypertension)   . Cancer ([place])     T-Cell Lymphoma  . Seizures ([place])     PAST SURGICAL HISTORY: Past Surgical History  Procedure Laterality Date  . Peripheral vascular catheterization N/A 04/24/2015    Procedure: Glori Luis Cath Insertion;  Surgeon: Algernon Huxley, MD;  Location: [facility];  Service: Cardiovascular;  Laterality: N/A;  . Portacath placement      FAMILY HISTORY:  Reviewed and unchanged. No reported history of malignancy or chronic disease.     ADVANCED DIRECTIVES:    HEALTH MAINTENANCE: Social History  Substance Use Topics  . Smoking status: Former Research scientist (life sciences)  . Smokeless tobacco: Not on file     Comment: Quit  in 1990  . Alcohol Use: Yes     Comment: Occassional glass of wine- none for over a year     No Known Allergies  Current Outpatient Prescriptions  Medication Sig Dispense Refill  . acetaminophen (TYLENOL) 325 MG tablet Take 650 mg by mouth every 6 (six) hours as needed for mild pain, moderate pain, fever or headache.    Marland Kitchen acidophilus (RISAQUAD) CAPS capsule Take 1 capsule by mouth 2 (two) times daily. 20 capsule 0  . Albuterol Sulfate 108 (90 Base) MCG/ACT AEPB Inhale 1 puff into the lungs every 6 (six) hours as needed. 1 each 12  . aspirin EC 325 MG tablet Take 325 mg by mouth daily.    Marland Kitchen dexamethasone (DECADRON) 4 MG tablet Take 1 tablet (4 mg total) by mouth 2 (two) times daily with a meal. Until 11/22/15.  Then take 1 tablet daily x7 days, then 1 tablet every other day until finished 20 tablet 0  . diphenoxylate-atropine (LOMOTIL) 2.5-0.025 MG tablet Take 1 tablet by mouth 4 (four) times daily as needed for diarrhea or loose stools. 30 tablet 0  . lidocaine-prilocaine (EMLA) cream Apply 1 application topically as needed. 30 g 2  . metoprolol tartrate (LOPRESSOR) 25 MG tablet Take 1 tablet (25 mg total) by mouth 2 (two) times daily. 60 tablet 3  . omeprazole (PRILOSEC) 40 MG capsule Take 1 capsule (  40 mg total) by mouth daily. 90 capsule 2  . ondansetron (ZOFRAN) 4 MG tablet Take 1 tablet (4 mg total) by mouth every 8 (eight) hours as needed for nausea. 30 tablet 1  . oxyCODONE-acetaminophen (ROXICET) 5-325 MG tablet Take 1 tablet by mouth every 4 (four) hours as needed for severe pain. 60 tablet 0  . potassium chloride SA (K-DUR,KLOR-CON) 20 MEQ tablet Take 1 tablet (20 mEq total) by mouth 2 (two) times daily. 60 tablet 3  . pravastatin  (PRAVACHOL) 80 MG tablet Take 80 mg by mouth daily.    . Oxycodone HCl 10 MG TABS Take 1 tablet (10 mg total) by mouth every 4 (four) hours as needed. 30 tablet 0   No current facility-administered medications for this visit.    OBJECTIVE: Filed Vitals:   01/08/16 0916  BP: 106/73  Pulse: 103  Temp: 97.4 F (36.3 C)  Resp: 20     There is no weight on file to calculate BMI.    ECOG FS:2 - Symptomatic, <50% confined to bed  General: Well-developed, well-nourished, no acute distress. Eyes: Pink conjunctiva, anicteric sclera. Lungs: Clear to auscultation bilaterally. Heart: Regular rate and rhythm. No rubs, murmurs, or gallops. Abdomen: Soft, nontender, distended. No organomegaly noted, normoactive bowel sounds. Musculoskeletal: No edema, cyanosis, or clubbing. Neuro: Alert, answering all questions appropriately. Cranial nerves grossly intact. Skin: No rashes or petechiae noted. Psych: Normal affect. Lymphatics: Easily palpable lymphadenopathy in bilateral axilla as well as bilateral neck. Tender to palpation.  LAB RESULTS:  Lab Results  Component Value Date   NA 135 12/28/2015   K 4.0 12/28/2015   CL 102 12/28/2015   CO2 27 12/28/2015   GLUCOSE 148* 12/28/2015   BUN 8 12/28/2015   CREATININE 0.67 12/28/2015   CALCIUM 8.1* 12/28/2015   PROT 5.9* 12/28/2015   ALBUMIN 2.9* 12/28/2015   AST 25 12/28/2015   ALT 26 12/28/2015   ALKPHOS 186* 12/28/2015   BILITOT 0.6 12/28/2015   GFRNONAA >60 12/28/2015   GFRAA >60 12/28/2015    Lab Results  Component Value Date   WBC 27.6* 12/28/2015   NEUTROABS 23.7* 12/28/2015   HGB 8.1* 12/28/2015   HCT 25.2* 12/28/2015   MCV 83.8 12/28/2015   PLT 195 12/28/2015     STUDIES: Ct Angio Chest Pe W/cm &/or Wo Cm  12/15/2015  CLINICAL DATA:  Increasing shortness of breath, tachycardia. Abdomen and pelvic pain. History of T-cell lymphoma. EXAM: CT ANGIOGRAPHY CHEST CT ABDOMEN AND PELVIS WITH CONTRAST TECHNIQUE: Multidetector CT  imaging of the chest was performed using the standard protocol during bolus administration of intravenous contrast. Multiplanar CT image reconstructions and MIPs were obtained to evaluate the vascular anatomy. Multidetector CT imaging of the abdomen and pelvis was performed using the standard protocol during bolus administration of intravenous contrast. CONTRAST:  100 cc Isovue 370 IV COMPARISON:  12/05/2015 CT of the abdomen and pelvis. PET CT 10/20/2015 FINDINGS: CTA CHEST FINDINGS Mediastinum/Nodes: Heart is borderline enlarged. Scattered coronary artery calcifications. No filling defects in the pulmonary arteries to suggest pulmonary emboli. There is bilateral hilar, mediastinal and bilateral axillary adenopathy. Index left axillary lymph node measures 1.6 cm, stable since prior PET CT. Index right axillary lymph node has a short axis diameter of 1.6 cm. When this is measured in the same planes an at the same level as prior study, this is not significantly changed, measuring 1.5 cm previously. Right paratracheal lymph node has a short axis diameter of 1.4 cm compared to 2.1  cm previously. Right hilar lymph node has a short axis diameter of 1.4 cm on image 54, likely not significantly changed. Left hilar lymph node has a short axis diameter of 1.5 cm on image 44, likely not significantly changed. Lungs/Pleura: There is bronchial wall thickening and mucous plugging noted in the lower lobe airways bilaterally, right greater than left. Patchy ground-glass nodular and airspace density in the right lower lobe likely reflects have inflammation or early bronchopneumonia. Scarring in the right middle lobe and lingula. Scattered nodular densities in both upper lobes. 4 mm nodule in the left upper lobe on image 22. 5 mm nodule peripherally in the right upper lobe on image 40. Other similarly sized or smaller upper lobe nodules. No pleural effusions. Musculoskeletal: Chest wall soft tissues are unremarkable. CT ABDOMEN and  PELVIS FINDINGS Hepatobiliary: No focal hepatic abnormality. Gallbladder unremarkable. Pancreas: Normal appearance Spleen: Normal appearance Adrenals/Urinary Tract: Bilateral renal cysts which appear benign, the largest on the right measuring 6.7 cm. No hydronephrosis. Adrenal glands and urinary bladder are unremarkable. Stomach/Bowel: Sigmoid diverticulosis. Previously seen sigmoid wall thickening and surrounding inflammatory change have resolved. No current evidence of active diverticulitis. Stomach and small bowel are decompressed and unremarkable. Appendix is normal. Vascular/Lymphatic: Aorta is normal caliber with scattered calcifications. No adenopathy in the abdomen or pelvis. Small bilateral inguinal lymph nodes are noted, stable. Reproductive: Grossly unremarkable Other: No free fluid or free air. Bilateral inguinal hernias containing fat. Musculoskeletal: No acute bony abnormality or focal bone lesion. Review of the MIP images confirms the above findings. IMPRESSION: No evidence of pulmonary embolus. Bilateral hilar, axillary and mediastinal adenopathy essentially stable since prior PET CT. Bronchial wall thickening and mucous plugging in the lower lobes bilaterally. Patchy ground-glass airspace disease in the right lower lobe could reflect early bronchopneumonia. Scattered nodular densities in the upper lobes bilaterally are new since prior PET CT. These could be inflammatory. Recommend follow-up CT in 3 months to ensure resolution. Resolution of the previously seen diverticulitis changes around the sigmoid colon. Diverticulosis changes again noted without active inflammation currently. No acute findings in the abdomen or pelvis. Electronically Signed   By: Rolm Baptise M.D.   On: 12/15/2015 15:03   Ct Abdomen Pelvis W Contrast  12/15/2015  CLINICAL DATA:  Increasing shortness of breath, tachycardia. Abdomen and pelvic pain. History of T-cell lymphoma. EXAM: CT ANGIOGRAPHY CHEST CT ABDOMEN AND PELVIS  WITH CONTRAST TECHNIQUE: Multidetector CT imaging of the chest was performed using the standard protocol during bolus administration of intravenous contrast. Multiplanar CT image reconstructions and MIPs were obtained to evaluate the vascular anatomy. Multidetector CT imaging of the abdomen and pelvis was performed using the standard protocol during bolus administration of intravenous contrast. CONTRAST:  100 cc Isovue 370 IV COMPARISON:  12/05/2015 CT of the abdomen and pelvis. PET CT 10/20/2015 FINDINGS: CTA CHEST FINDINGS Mediastinum/Nodes: Heart is borderline enlarged. Scattered coronary artery calcifications. No filling defects in the pulmonary arteries to suggest pulmonary emboli. There is bilateral hilar, mediastinal and bilateral axillary adenopathy. Index left axillary lymph node measures 1.6 cm, stable since prior PET CT. Index right axillary lymph node has a short axis diameter of 1.6 cm. When this is measured in the same planes an at the same level as prior study, this is not significantly changed, measuring 1.5 cm previously. Right paratracheal lymph node has a short axis diameter of 1.4 cm compared to 2.1 cm previously. Right hilar lymph node has a short axis diameter of 1.4 cm on image 54, likely  not significantly changed. Left hilar lymph node has a short axis diameter of 1.5 cm on image 44, likely not significantly changed. Lungs/Pleura: There is bronchial wall thickening and mucous plugging noted in the lower lobe airways bilaterally, right greater than left. Patchy ground-glass nodular and airspace density in the right lower lobe likely reflects have inflammation or early bronchopneumonia. Scarring in the right middle lobe and lingula. Scattered nodular densities in both upper lobes. 4 mm nodule in the left upper lobe on image 22. 5 mm nodule peripherally in the right upper lobe on image 40. Other similarly sized or smaller upper lobe nodules. No pleural effusions. Musculoskeletal: Chest wall soft  tissues are unremarkable. CT ABDOMEN and PELVIS FINDINGS Hepatobiliary: No focal hepatic abnormality. Gallbladder unremarkable. Pancreas: Normal appearance Spleen: Normal appearance Adrenals/Urinary Tract: Bilateral renal cysts which appear benign, the largest on the right measuring 6.7 cm. No hydronephrosis. Adrenal glands and urinary bladder are unremarkable. Stomach/Bowel: Sigmoid diverticulosis. Previously seen sigmoid wall thickening and surrounding inflammatory change have resolved. No current evidence of active diverticulitis. Stomach and small bowel are decompressed and unremarkable. Appendix is normal. Vascular/Lymphatic: Aorta is normal caliber with scattered calcifications. No adenopathy in the abdomen or pelvis. Small bilateral inguinal lymph nodes are noted, stable. Reproductive: Grossly unremarkable Other: No free fluid or free air. Bilateral inguinal hernias containing fat. Musculoskeletal: No acute bony abnormality or focal bone lesion. Review of the MIP images confirms the above findings. IMPRESSION: No evidence of pulmonary embolus. Bilateral hilar, axillary and mediastinal adenopathy essentially stable since prior PET CT. Bronchial wall thickening and mucous plugging in the lower lobes bilaterally. Patchy ground-glass airspace disease in the right lower lobe could reflect early bronchopneumonia. Scattered nodular densities in the upper lobes bilaterally are new since prior PET CT. These could be inflammatory. Recommend follow-up CT in 3 months to ensure resolution. Resolution of the previously seen diverticulitis changes around the sigmoid colon. Diverticulosis changes again noted without active inflammation currently. No acute findings in the abdomen or pelvis. Electronically Signed   By: Rolm Baptise M.D.   On: 12/15/2015 15:03   [place] Extrem Up Bilat [employer]  01/04/2016  CLINICAL DATA:  Palpable areas of fullness and tenderness in each axilla. History of T-cell lymphoma EXAM: BILATERAL UPPER  EXTREMITY/ AXILLARY REGIONS SOFT TISSUE ULTRASOUND TECHNIQUE: Ultrasound examination of the axillary regions bilaterally performed COMPARISON:  Chest CT December 15, 2015 FINDINGS: Ultrasound of both axillary regions was performed. There are multiple enlarged nearly anechoic lymph nodes in each axillary region. The largest axillary lymph node on the right measures 3.2 x 2.5 x 2.8 cm. The largest lymph node on the left measures 4.8 x 2.4 x 3.6 cm. IMPRESSION: Extensive axillary adenopathy bilaterally. These lymph nodes are virtually anechoic, an appearance that may be seen associated with lymphoma. Electronically Signed   By: Lowella Grip III M.D.   On: 01/04/2016 14:46   Dg Abd Acute W/chest  12/15/2015  CLINICAL DATA:  Shortness of breath. Abdominal pain. Tachycardia. Persistent weakness after discharge from hospital. EXAM: DG ABDOMEN ACUTE W/ 1V CHEST COMPARISON:  CT abdomen and pelvis 12/05/2015. FINDINGS: The heart is mildly enlarged. A right IJ Port-A-Cath is stable. The lungs are clear. The bowel gas pattern is normal. Calcifications in the anatomic pelvis compatible with phleboliths are stable. Gas and stool are within normal limits. The axial skeleton is unremarkable. IMPRESSION: No acute cardiopulmonary disease or acute abdominal process. Stable cardiomegaly without failure. Electronically Signed   By: San Morelle M.D.   On:  12/15/2015 12:11   ONCOLOGIC TREATMENT HISTORY: Patient received 3 cycles of CHOP between January and March 2014 achieving a complete remission. Upon recurrence, patient received 4 cycle CHOP plus etoposide between August and October 2016. Patient received his lifetime dose of Adriamycin which was dropped from his regimen and he received 4 additional cycles of etoposide plus CVP from November 2016 through February 2017.  ASSESSMENT: Recurrent stage IV T-cell lymphoma.  PLAN:    1. T-cell lymphoma: Although recent CT scans suggest stable disease, ultrasound of his  bilateral axilla as well as physical exam suggest significant progression of disease. We will get a stat CT scan of the neck, chest, abdomen, and pelvis in the next 1-2 days to assess for interval change over 3 weeks ago. Patient will keep his follow-up appointment on Thursday to discuss the results and either continuation of gemcitabine and oxaliplatin or consideration of different treatment. Hospice and end-of-life care was also discussed, the patient is not ready to make this decision at this point.   2. Anemia: Hemoglobin stable at 9.7. Secondary to chemotherapy, monitor. 3. Leukocytosis: Likely multifactorial with possible progression of disease, reactive, and recent use of Neulasta. 4. Hypokalemia: Continue oral potassium supplementation. 5. Reflux: Continue omeprazole 2 times per day. 6. Shortness of breath: Continue albuterol.  7. Nausea: Patient to use ondansetron PRN. Treatment per reflux as above. 8. 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 Monitor. 9. Depression: Improved. Continue Celexa 10 mg daily. 10. Weight loss: Secondary to altered taste. Patient to continue with Boost and oral intake as often as tolerated.   Approximately 30 minutes spent in discussion of which greater than 50% was consultation.  Patient expressed understanding and was in agreement with this plan. He also understands that He can call clinic at any time with any questions, concerns, or complaints.   Lloyd Huger, MD   01/08/2016 10:53 AM

## 2016-01-11 ENCOUNTER — Inpatient Hospital Stay (HOSPITAL_BASED_OUTPATIENT_CLINIC_OR_DEPARTMENT_OTHER): Payer: Medicare Other | Admitting: Oncology

## 2016-01-11 ENCOUNTER — Inpatient Hospital Stay: Payer: Medicare Other

## 2016-01-11 VITALS — BP 118/77 | HR 128 | Temp 99.5°F | Resp 24 | Wt 187.0 lb

## 2016-01-11 DIAGNOSIS — R110 Nausea: Secondary | ICD-10-CM

## 2016-01-11 DIAGNOSIS — R000 Tachycardia, unspecified: Secondary | ICD-10-CM

## 2016-01-11 DIAGNOSIS — D649 Anemia, unspecified: Secondary | ICD-10-CM

## 2016-01-11 DIAGNOSIS — C8448 Peripheral T-cell lymphoma, not classified, lymph nodes of multiple sites: Secondary | ICD-10-CM

## 2016-01-11 DIAGNOSIS — K219 Gastro-esophageal reflux disease without esophagitis: Secondary | ICD-10-CM

## 2016-01-11 DIAGNOSIS — R439 Unspecified disturbances of smell and taste: Secondary | ICD-10-CM

## 2016-01-11 DIAGNOSIS — R509 Fever, unspecified: Secondary | ICD-10-CM

## 2016-01-11 DIAGNOSIS — E876 Hypokalemia: Secondary | ICD-10-CM | POA: Diagnosis not present

## 2016-01-11 DIAGNOSIS — R52 Pain, unspecified: Secondary | ICD-10-CM

## 2016-01-11 DIAGNOSIS — C8442 Peripheral T-cell lymphoma, not classified, intrathoracic lymph nodes: Secondary | ICD-10-CM

## 2016-01-11 DIAGNOSIS — G620 Drug-induced polyneuropathy: Secondary | ICD-10-CM

## 2016-01-11 DIAGNOSIS — T451X5S Adverse effect of antineoplastic and immunosuppressive drugs, sequela: Secondary | ICD-10-CM

## 2016-01-11 DIAGNOSIS — R0602 Shortness of breath: Secondary | ICD-10-CM

## 2016-01-11 DIAGNOSIS — D72829 Elevated white blood cell count, unspecified: Secondary | ICD-10-CM

## 2016-01-11 DIAGNOSIS — R634 Abnormal weight loss: Secondary | ICD-10-CM

## 2016-01-11 LAB — CBC WITH DIFFERENTIAL/PLATELET
BASOS PCT: 0 %
Basophils Absolute: 0 10*3/uL (ref 0–0.1)
EOS ABS: 0.1 10*3/uL (ref 0–0.7)
Eosinophils Relative: 1 %
HCT: 28.2 % — ABNORMAL LOW (ref 40.0–52.0)
HEMOGLOBIN: 8.9 g/dL — AB (ref 13.0–18.0)
LYMPHS ABS: 1.4 10*3/uL (ref 1.0–3.6)
Lymphocytes Relative: 11 %
MCH: 25.9 pg — AB (ref 26.0–34.0)
MCHC: 31.5 g/dL — ABNORMAL LOW (ref 32.0–36.0)
MCV: 82.4 fL (ref 80.0–100.0)
Monocytes Absolute: 1.6 10*3/uL — ABNORMAL HIGH (ref 0.2–1.0)
Monocytes Relative: 12 %
NEUTROS PCT: 76 %
Neutro Abs: 10.3 10*3/uL — ABNORMAL HIGH (ref 1.4–6.5)
Platelets: 435 10*3/uL (ref 150–440)
RBC: 3.42 MIL/uL — AB (ref 4.40–5.90)
RDW: 21.9 % — ABNORMAL HIGH (ref 11.5–14.5)
WBC: 13.4 10*3/uL — ABNORMAL HIGH (ref 3.8–10.6)

## 2016-01-11 LAB — COMPREHENSIVE METABOLIC PANEL
ALBUMIN: 2.7 g/dL — AB (ref 3.5–5.0)
ALK PHOS: 213 U/L — AB (ref 38–126)
ALT: 13 U/L — AB (ref 17–63)
AST: 20 U/L (ref 15–41)
Anion gap: 7 (ref 5–15)
BUN: 15 mg/dL (ref 6–20)
CALCIUM: 8.4 mg/dL — AB (ref 8.9–10.3)
CO2: 23 mmol/L (ref 22–32)
CREATININE: 0.97 mg/dL (ref 0.61–1.24)
Chloride: 103 mmol/L (ref 101–111)
GFR calc Af Amer: >60 mL/min (ref >60)
GFR calc non Af Amer: >60 mL/min (ref >60)
GLUCOSE: 125 mg/dL — AB (ref 65–99)
Potassium: 5 mmol/L (ref 3.5–5.1)
SODIUM: 133 mmol/L — AB (ref 135–145)
Total Bilirubin: 0.6 mg/dL (ref 0.3–1.2)
Total Protein: 5.9 g/dL — ABNORMAL LOW (ref 6.5–8.1)

## 2016-01-11 LAB — CORTISOL: Cortisol, Plasma: 29.5 ug/dL

## 2016-01-11 MED ORDER — SODIUM CHLORIDE 0.9% FLUSH
10.0000 mL | INTRAVENOUS | Status: DC | PRN
  Administered 2016-01-11: 10 mL
  Filled 2016-01-11: qty 10

## 2016-01-11 MED ORDER — HEPARIN SOD (PORK) LOCK FLUSH 100 UNIT/ML IV SOLN
INTRAVENOUS | Status: AC
  Filled 2016-01-11: qty 5

## 2016-01-11 MED ORDER — HEPARIN SOD (PORK) LOCK FLUSH 100 UNIT/ML IV SOLN
500.0000 [IU] | Freq: Once | INTRAVENOUS | Status: AC | PRN
  Administered 2016-01-11: 500 [IU]

## 2016-01-11 NOTE — Progress Notes (Signed)
Patient's wife has noticed a knot on the back of his neck.  He is having difficulty swallowing solids and liquids.  They do think the Oxycodone 10mg  last a couple hours longer.  He is becoming more weak with increased leg weakness.  Has night sweats and chills.

## 2016-01-14 ENCOUNTER — Other Ambulatory Visit: Payer: Self-pay | Admitting: Family Medicine

## 2016-01-15 ENCOUNTER — Telehealth: Payer: Self-pay | Admitting: *Deleted

## 2016-01-15 MED ORDER — FENTANYL 50 MCG/HR TD PT72: 50.0000 ug | MEDICATED_PATCH | TRANSDERMAL | Status: AC

## 2016-01-15 MED ORDER — OXYCODONE HCL 10 MG PO TABS: 10.0000 mg | ORAL_TABLET | ORAL | Status: AC | PRN

## 2016-01-15 NOTE — Telephone Encounter (Signed)
Erik Shelton will fax orders

## 2016-01-15 NOTE — Telephone Encounter (Signed)
Went out to [facility] and he was writhing in pain having tumor sweats Is taking oxycodone 10 mg every 4 hours. Asking for a long acting med and refill on Oxycodone. Fentanyl 50 mcg per Dr Grayland Ormond and ok to refill Oxycodone, rx faxed

## 2016-01-15 NOTE — Telephone Encounter (Signed)
Referral faxed

## 2016-01-15 NOTE — Telephone Encounter (Signed)
Need referral faxed over ASAP this morning for hospice, they want services first thing this morning Fax to [phone]

## 2016-01-16 ENCOUNTER — Other Ambulatory Visit: Payer: Self-pay | Admitting: *Deleted

## 2016-01-16 MED ORDER — MORPHINE SULFATE (CONCENTRATE) 20 MG/ML PO SOLN: ORAL | Status: AC

## 2016-01-17 NOTE — Progress Notes (Signed)
[place]  [phone]) [phone] [phone]) [phone]  ID: Erik Shelton OB: 1941/01/27  MR#: [id number]  [id number]  Patient Care Team: [name], MD as PCP - General (Internal Medicine)  CHIEF COMPLAINT:  Chief Complaint  Patient presents with  . Lymphoma    INTERVAL HISTORY: Patient returns to clinic today for further evaluation and to discuss his imaging results. His performance status continues to decline on a daily basis. He continues to have significant pain particularly in his bilateral axilla radiating down through his abdomen. He continues to have low-grade fevers and night sweats. He is not eating and has minimal PO intake. Patient feels generally terrible, but offers no further specific complaints.    REVIEW OF SYSTEMS:   Review of Systems  Constitutional: fever, positive for malaise/fatigue. Positive for weight loss. Respiratory: Negative for cough, wheezing.  Positive for shortness of breath. Cardiovascular: Negative.   Gastrointestinal: abdominal pain and bloating.  Musculoskeletal: Positive for weakness.   Neurological: Positive for sensory change.   Endo/Heme/Allergies: Does not bruise/bleed easily.   As per HPI. Otherwise, a complete review of systems is negatve.  PAST MEDICAL HISTORY: Past Medical History  Diagnosis Date  . Stroke ([place])   . Seizures ([place])   . Hypercholesteremia   . HTN (hypertension)   . Seizures ([place])   . Cancer ([place])     T-Cell Lymphoma    PAST SURGICAL HISTORY: Past Surgical History  Procedure Laterality Date  . Peripheral vascular catheterization N/A 04/24/2015    Procedure: Glori Luis Cath Insertion;  Surgeon: Algernon Huxley, MD;  Location: [facility];  Service: Cardiovascular;  Laterality: N/A;  . Portacath placement      FAMILY HISTORY: Reviewed and unchanged. No reported history of malignancy or chronic disease.     ADVANCED DIRECTIVES:    HEALTH MAINTENANCE: Social History    Substance Use Topics  . Smoking status: Former Research scientist (life sciences)  . Smokeless tobacco: Not on file     Comment: Quit  in 1990  . Alcohol Use: Yes     Comment: Occassional glass of wine- none for over a year     No Known Allergies  Current Outpatient Prescriptions  Medication Sig Dispense Refill  . acetaminophen (TYLENOL) 325 MG tablet Take 650 mg by mouth every 6 (six) hours as needed for mild pain, moderate pain, fever or headache.    Marland Kitchen acidophilus (RISAQUAD) CAPS capsule Take 1 capsule by mouth 2 (two) times daily. 20 capsule 0  . Albuterol Sulfate 108 (90 Base) MCG/ACT AEPB Inhale 1 puff into the lungs every 6 (six) hours as needed. 1 each 12  . aspirin EC 325 MG tablet Take 325 mg by mouth daily.    Marland Kitchen lidocaine-prilocaine (EMLA) cream Apply 1 application topically as needed. 30 g 2  . metoprolol tartrate (LOPRESSOR) 25 MG tablet Take 1 tablet (25 mg total) by mouth 2 (two) times daily. 60 tablet 3  . omeprazole (PRILOSEC) 40 MG capsule Take 1 capsule (40 mg total) by mouth daily. 90 capsule 2  . ondansetron (ZOFRAN) 4 MG tablet Take 1 tablet (4 mg total) by mouth every 8 (eight) hours as needed for nausea. 30 tablet 1  . potassium chloride SA (K-DUR,KLOR-CON) 20 MEQ tablet Take 1 tablet (20 mEq total) by mouth 2 (two) times daily. 60 tablet 3  . pravastatin (PRAVACHOL) 80 MG tablet Take 80 mg by mouth daily.    . diphenoxylate-atropine (LOMOTIL) 2.5-0.025 MG tablet Take 1 tablet by mouth 4 (four)  times daily as needed for diarrhea or loose stools. (Patient not taking: Reported on 01/11/2016) 30 tablet 0  . fentaNYL (DURAGESIC - DOSED MCG/HR) 50 MCG/HR Place 1 patch (50 mcg total) onto the skin every 3 (three) days. 5 patch 0  . morphine (ROXANOL) 20 MG/ML concentrated solution 0.25 - 1 ml (5 mg - 20 mg) every 1 - 2 hours as needed for pain, SOB 30 mL 0  . Oxycodone HCl 10 MG TABS Take 1 tablet (10 mg total) by mouth every 4 (four) hours as needed. 90 tablet 0   No current facility-administered  medications for this visit.    OBJECTIVE: Filed Vitals:   01/11/16 0959  BP: 118/77  Pulse: 128  Temp: 99.5 F (37.5 C)  Resp: 24     Body mass index is 26.09 kg/(m^2).    ECOG FS:3 - Symptomatic, >50% confined to bed  General: Ill-appearing, mild distress secondary to pain. Eyes: Pink conjunctiva, anicteric sclera. Lungs: Clear to auscultation bilaterally. Heart: Regular rate and rhythm. No rubs, murmurs, or gallops. Abdomen: Mildly distended, tender, normoactive bowel sounds. Musculoskeletal: No edema, cyanosis, or clubbing. Neuro: Alert, answering all questions appropriately. Cranial nerves grossly intact. Skin: No rashes or petechiae noted. Psych: Normal affect. Lymphatics: Easily palpable lymphadenopathy in bilateral axilla as well as bilateral neck. Tender to palpation.  LAB RESULTS:  Lab Results  Component Value Date   NA 133* 01/11/2016   K 5.0 01/11/2016   CL 103 01/11/2016   CO2 23 01/11/2016   GLUCOSE 125* 01/11/2016   BUN 15 01/11/2016   CREATININE 0.97 01/11/2016   CALCIUM 8.4* 01/11/2016   PROT 5.9* 01/11/2016   ALBUMIN 2.7* 01/11/2016   AST 20 01/11/2016   ALT 13* 01/11/2016   ALKPHOS 213* 01/11/2016   BILITOT 0.6 01/11/2016   GFRNONAA >60 01/11/2016   GFRAA >60 01/11/2016    Lab Results  Component Value Date   WBC 13.4* 01/11/2016   NEUTROABS 10.3* 01/11/2016   HGB 8.9* 01/11/2016   HCT 28.2* 01/11/2016   MCV 82.4 01/11/2016   PLT 435 01/11/2016     STUDIES: Ct Soft Tissue Neck W Contrast  01/08/2016  CLINICAL DATA:  T-cell lymphoma.  New axillary nodules. EXAM: CT NECK WITH CONTRAST TECHNIQUE: Multidetector CT imaging of the neck was performed using the standard protocol following the bolus administration of intravenous contrast. CONTRAST:  153mL ISOVUE-300 IOPAMIDOL (ISOVUE-300) INJECTION 61% COMPARISON:  PET-CT 10/20/2015.  Neck CT 04/10/2015. FINDINGS: Pharynx and larynx: Asymmetric soft tissue fullness with a mildly nodular appearance of  the left palatine tonsil is similar to the prior PET-CT and has significantly decreased in size compared to the more remote neck CT. The larynx is unremarkable. Salivary glands: There are multiple hyperenhancing soft tissue nodules throughout both parotid glands which overall have increased in size compared to the prior neck CT and likely reflect abnormal intraparotid lymph nodes. They also appear to have increased in size from the more recent PET-CT, although direct comparison is somewhat limited due to the lack of IV contrast on that examination. These measure up to 1.4 x 1.3 cm on the right and 1.3 x 1.1 cm on the left. There is also a 1.3 cm lower density nodule with some peripheral enhancement more anteriorly in the superficial left parotid gland at the site of a prior 1 cm soft tissue nodule, presumably a lymph node. Also may be a similar 2 cm nodule more posteriorly in the left parotid gland. The submandibular glands are somewhat small  in size without a discrete mass identified. Thyroid: Unremarkable. Lymph nodes: Mildly enlarged lymph nodes are present throughout all stations of the neck bilaterally and have increased in size from the prior PET-CT. Submental lymph nodes measure up to 8 mm in short axis. Submandibular lymph nodes measure up to 1.3 cm in short axis on the right and 1.1 cm on the left. Level II lymph nodes measure up to 9 mm on the right and 10 mm on the left. A low-density right level VA lymph node measures 10 mm. A low-density right supraclavicular lymph node measures 11 mm, and a left level III lymph node measures 9 mm. There is also an 11 mm left level V lymph node. Vascular: Major vascular structures of the neck appear patent. Limited intracranial: The visualized portion of the brain is unremarkable. Visualized orbits: Unremarkable. Mastoids and visualized paranasal sinuses: Mild bilateral maxillary sinus mucosal thickening. Visualized mastoid air cells are clear. Skeleton: No suspicious  lytic or blastic osseous lesions are identified. Cervical disc degeneration is greatest at C5-6 and C6-7. Upper chest: Evaluated on concurrent dedicated chest CT. IMPRESSION: New lymphadenopathy throughout the neck bilaterally consistent with known lymphoma. Electronically Signed   By: Logan Bores M.D.   On: 01/08/2016 13:26   Ct Chest W Contrast  01/08/2016  CLINICAL DATA:  Painful axillary nodules, night sweats, decreased appetite, lymphoma. EXAM: CT CHEST, ABDOMEN, AND PELVIS WITH CONTRAST TECHNIQUE: Multidetector CT imaging of the chest, abdomen and pelvis was performed following the standard protocol during bolus administration of intravenous contrast. CONTRAST:  182mL ISOVUE-300 IOPAMIDOL (ISOVUE-300) INJECTION 61% COMPARISON:  12/15/2015. FINDINGS: CT CHEST FINDINGS Mediastinum/Lymph Nodes: Right IJ Port-A-Cath terminates in the low SVC. There are are multiple enlarged mediastinal lymph nodes, measuring up to 1.5 cm in the low right paratracheal station, likely stable. Bi hilar adenopathy measures up to 1.4 cm on the right, stable. Bulky bilateral axillary adenopathy measures up to 3.2 x 4.1 cm on the left. This area was not imaged on the prior exam. A second index lymph node in the right axilla measures 2.5 cm in short axis (series 3, image 14), previously 1.6 cm. Three-vessel coronary artery calcification. Heart size normal. No pericardial effusion. Sub cm lymph nodes extend along the course of the descending thoracic aorta and esophagus. Lungs/Pleura: Scattered peribronchovascular nodularity and minimal consolidation are unchanged and possibly post infectious in etiology. Tiny bilateral pleural effusions are new. Airway is unremarkable. Musculoskeletal: Probable sclerotic bone island in the lower right posterior rib. No worrisome lytic or sclerotic lesions. CT ABDOMEN PELVIS FINDINGS Hepatobiliary: Liver and gallbladder are unremarkable. No biliary ductal dilatation. Pancreas: Negative. Spleen:  Negative. Adrenals/Urinary Tract: Adrenal glands are unremarkable. Low-attenuation lesions in the kidneys measure up to 6.7 cm on the right, stable and likely cysts. Kidneys are otherwise unremarkable. Ureters are decompressed. Bladder is low in volume. Stomach/Bowel: Stomach, small bowel and colon are unremarkable. Appendix is not visualized. Vascular/Lymphatic: Atherosclerotic calcification of the arterial vasculature without abdominal aortic aneurysm. New/enlarging adenopathy throughout the abdomen and pelvis. Enlarged lymph nodes are seen in the abdominal peritoneal ligaments, porta hepatis, retrocrural station, small bowel mesentery and retroperitoneum. Index portacaval lymph node measures 2.8 cm (series 3, image 59), previously 1.2 cm. Index left periaortic lymph node, 1.6 cm (image 75), previously 0.9 cm. Small bowel mesenteric lymph nodes measure up to 11 mm (image 75), previously all sub cm in short axis size. Bilateral iliac chain and inguinal adenopathy. Index left external iliac lymph node measures 1.9 cm (image 111), previously  0.9 cm. Index right inguinal lymph node measures 2.6 cm (image 123), previously 1.6 cm. Reproductive: Prostate is normal in size. Other: Small ascites. Mesenteries and peritoneum are otherwise unremarkable. Minimal presacral edema. Musculoskeletal: Probable bone island in the left sacrum. Otherwise, no worrisome lytic or sclerotic lesions. IMPRESSION: 1. New/enlarging axillary, abdominal and pelvic adenopathy, consistent with progressive lymphoma. 2. Small ascites. 3. Coronary artery calcification. Electronically Signed   By: Lorin Picket M.D.   On: 01/08/2016 13:16   Ct Abdomen Pelvis W Contrast  01/08/2016  CLINICAL DATA:  Painful axillary nodules, night sweats, decreased appetite, lymphoma. EXAM: CT CHEST, ABDOMEN, AND PELVIS WITH CONTRAST TECHNIQUE: Multidetector CT imaging of the chest, abdomen and pelvis was performed following the standard protocol during bolus  administration of intravenous contrast. CONTRAST:  133mL ISOVUE-300 IOPAMIDOL (ISOVUE-300) INJECTION 61% COMPARISON:  12/15/2015. FINDINGS: CT CHEST FINDINGS Mediastinum/Lymph Nodes: Right IJ Port-A-Cath terminates in the low SVC. There are are multiple enlarged mediastinal lymph nodes, measuring up to 1.5 cm in the low right paratracheal station, likely stable. Bi hilar adenopathy measures up to 1.4 cm on the right, stable. Bulky bilateral axillary adenopathy measures up to 3.2 x 4.1 cm on the left. This area was not imaged on the prior exam. A second index lymph node in the right axilla measures 2.5 cm in short axis (series 3, image 14), previously 1.6 cm. Three-vessel coronary artery calcification. Heart size normal. No pericardial effusion. Sub cm lymph nodes extend along the course of the descending thoracic aorta and esophagus. Lungs/Pleura: Scattered peribronchovascular nodularity and minimal consolidation are unchanged and possibly post infectious in etiology. Tiny bilateral pleural effusions are new. Airway is unremarkable. Musculoskeletal: Probable sclerotic bone island in the lower right posterior rib. No worrisome lytic or sclerotic lesions. CT ABDOMEN PELVIS FINDINGS Hepatobiliary: Liver and gallbladder are unremarkable. No biliary ductal dilatation. Pancreas: Negative. Spleen: Negative. Adrenals/Urinary Tract: Adrenal glands are unremarkable. Low-attenuation lesions in the kidneys measure up to 6.7 cm on the right, stable and likely cysts. Kidneys are otherwise unremarkable. Ureters are decompressed. Bladder is low in volume. Stomach/Bowel: Stomach, small bowel and colon are unremarkable. Appendix is not visualized. Vascular/Lymphatic: Atherosclerotic calcification of the arterial vasculature without abdominal aortic aneurysm. New/enlarging adenopathy throughout the abdomen and pelvis. Enlarged lymph nodes are seen in the abdominal peritoneal ligaments, porta hepatis, retrocrural station, small bowel  mesentery and retroperitoneum. Index portacaval lymph node measures 2.8 cm (series 3, image 59), previously 1.2 cm. Index left periaortic lymph node, 1.6 cm (image 75), previously 0.9 cm. Small bowel mesenteric lymph nodes measure up to 11 mm (image 75), previously all sub cm in short axis size. Bilateral iliac chain and inguinal adenopathy. Index left external iliac lymph node measures 1.9 cm (image 111), previously 0.9 cm. Index right inguinal lymph node measures 2.6 cm (image 123), previously 1.6 cm. Reproductive: Prostate is normal in size. Other: Small ascites. Mesenteries and peritoneum are otherwise unremarkable. Minimal presacral edema. Musculoskeletal: Probable bone island in the left sacrum. Otherwise, no worrisome lytic or sclerotic lesions. IMPRESSION: 1. New/enlarging axillary, abdominal and pelvic adenopathy, consistent with progressive lymphoma. 2. Small ascites. 3. Coronary artery calcification. Electronically Signed   By: Lorin Picket M.D.   On: 01/08/2016 13:16   [place]  01/04/2016  CLINICAL DATA:  Palpable areas of fullness and tenderness in each axilla. History of T-cell lymphoma EXAM: BILATERAL UPPER EXTREMITY/ AXILLARY REGIONS SOFT TISSUE ULTRASOUND TECHNIQUE: Ultrasound examination of the axillary regions bilaterally performed COMPARISON:  Chest CT December 15, 2015 FINDINGS: Ultrasound of both axillary regions was performed. There are multiple enlarged nearly anechoic lymph nodes in each axillary region. The largest axillary lymph node on the right measures 3.2 x 2.5 x 2.8 cm. The largest lymph node on the left measures 4.8 x 2.4 x 3.6 cm. IMPRESSION: Extensive axillary adenopathy bilaterally. These lymph nodes are virtually anechoic, an appearance that may be seen associated with lymphoma. Electronically Signed   By: Lowella Grip III M.D.   On: 01/04/2016 14:46   ONCOLOGIC TREATMENT HISTORY: Patient received 3 cycles of CHOP between January and March 2014 achieving  a complete remission. Upon recurrence, patient received 4 cycle CHOP plus etoposide between August and October 2016. Patient received his lifetime dose of Adriamycin which was dropped from his regimen and he received 4 additional cycles of etoposide plus CVP from November 2016 through February 2017.  ASSESSMENT: Recurrent stage IV T-cell lymphoma.  PLAN:    1. T-cell lymphoma: CT scan results 3 weeks after his most recent imaging reviewed independently and reported as above reveals significant regression of disease. Patient's performance status is also declining. After lengthy discussion with the patient and his wife they are not ready for hospice and wish to pursue additional treatment with Brentuximab. Agents initial biopsy was CD30 positive, most recent biopsy is CD30 negative. I suspect this is difference and sampling and a mixed tumor. Return to clinic in 1 week to initiate cycle 1.  2. Anemia: Hemoglobin slightly decreased at 8.9, monitor. 3. Leukocytosis: Likely multifactorial including secondary to progression of disease.. 4. Hypokalemia: Continue oral potassium supplementation. 5. Reflux: Continue omeprazole 2 times per day. 6. Shortness of breath: Continue albuterol.  7. Nausea: Patient to use ondansetron PRN. Treatment per reflux as above. 8. 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 Monitor. 9. Depression: Continue Celexa 10 mg daily. 10. Weight loss: Secondary to altered taste. Patient to continue with Boost and oral intake as often as tolerated.   Approximately 30 minutes spent in discussion of which greater than 50% was consultation.  Patient expressed understanding and was in agreement with this plan. He also understands that He can call clinic at any time with any questions, concerns, or complaints.   Lloyd Huger, MD   01/17/2016 4:10 PM

## 2016-01-17 NOTE — Progress Notes (Signed)
Patient's wife called over this past weekend reporting increasing pain and significant decrease in performance status. Patient is now bedbound. They wish to cancel any further treatments and pursue hospice care at this time. No further follow-up has been scheduled.

## 2016-01-18 ENCOUNTER — Inpatient Hospital Stay: Payer: Medicare Other

## 2016-01-18 ENCOUNTER — Inpatient Hospital Stay: Payer: Medicare Other | Admitting: Oncology

## 2016-01-18 ENCOUNTER — Ambulatory Visit: Payer: Medicare Other | Admitting: Cardiology

## 2016-01-18 ENCOUNTER — Ambulatory Visit: Payer: Medicare Other

## 2016-01-19 ENCOUNTER — Telehealth: Payer: Self-pay | Admitting: *Deleted

## 2016-01-19 NOTE — Telephone Encounter (Signed)
Called to report that patient expired in the [place]

## 2016-01-25 LAB — MISCELLANEOUS TEST

## 2016-02-09 ENCOUNTER — Inpatient Hospital Stay: Admission: RE | Admit: 2016-02-09 | Payer: Medicare Other | Source: Ambulatory Visit | Admitting: Radiation Oncology

## 2016-02-09 ENCOUNTER — Ambulatory Visit: Payer: Medicare Other | Admitting: Radiation Oncology

## 2016-02-15 DEATH — deceased

## 2016-03-18 ENCOUNTER — Other Ambulatory Visit: Payer: Self-pay | Admitting: Nurse Practitioner

## 2016-04-29 ENCOUNTER — Ambulatory Visit: Payer: Medicare Other | Admitting: Radiation Oncology

## 2016-06-28 IMAGING — US US EXTREM UP BILAT LTD
1 series · 14 of 20 positions shown · non-contrast
Comparison: Chest CT December 15, 2015

CLINICAL DATA: Palpable areas of fullness and tenderness in each
axilla. History of T-cell lymphoma

EXAM:
BILATERAL UPPER EXTREMITY/ AXILLARY REGIONS SOFT TISSUE ULTRASOUND
TECHNIQUE: Ultrasound examination of the axillary regions bilaterally performed

[Series 1: us extrem up bilat ltd · 0.08mm/px · 20 acquisitions, 14 frames shown]
[im 1/20]
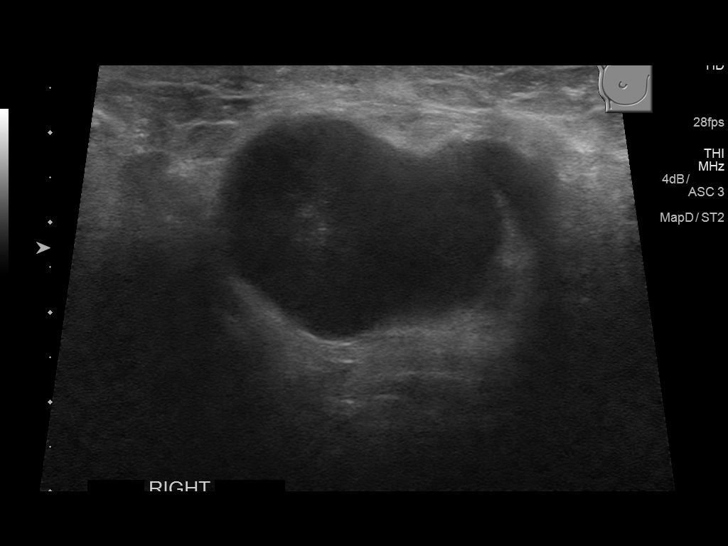
[im 3/20]
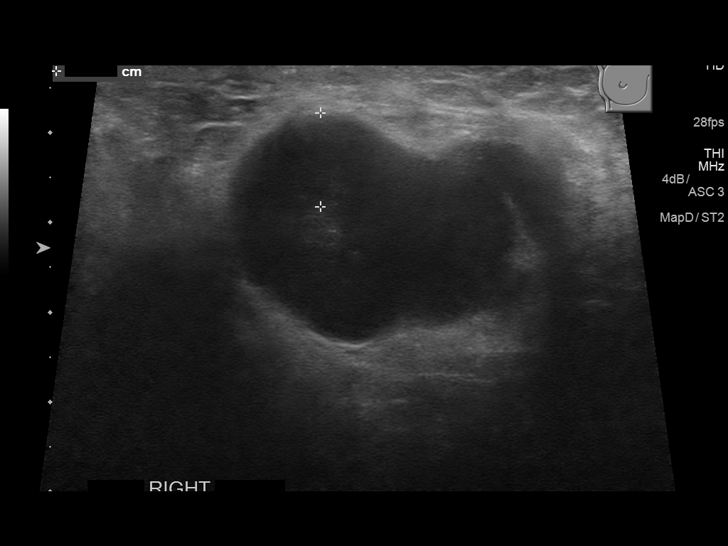
[im 4/20]
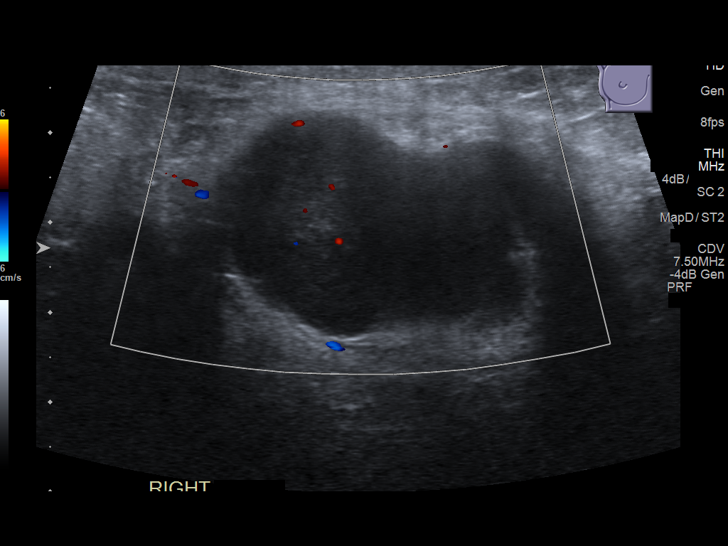
[im 6/20]
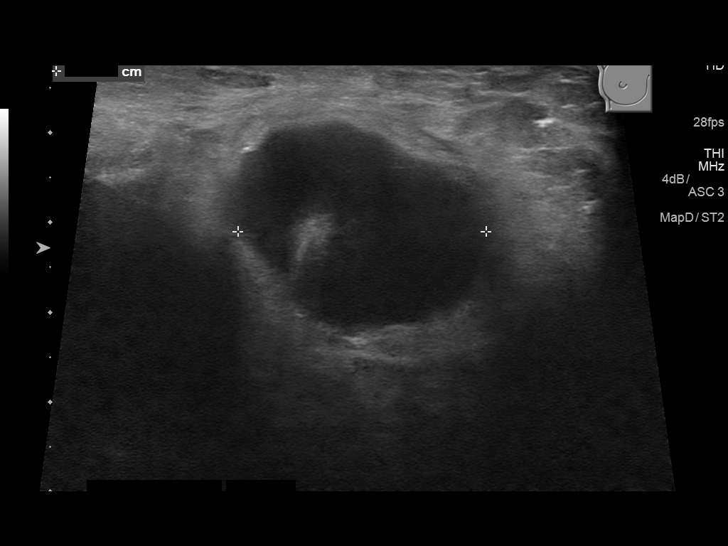
[im 7/20]
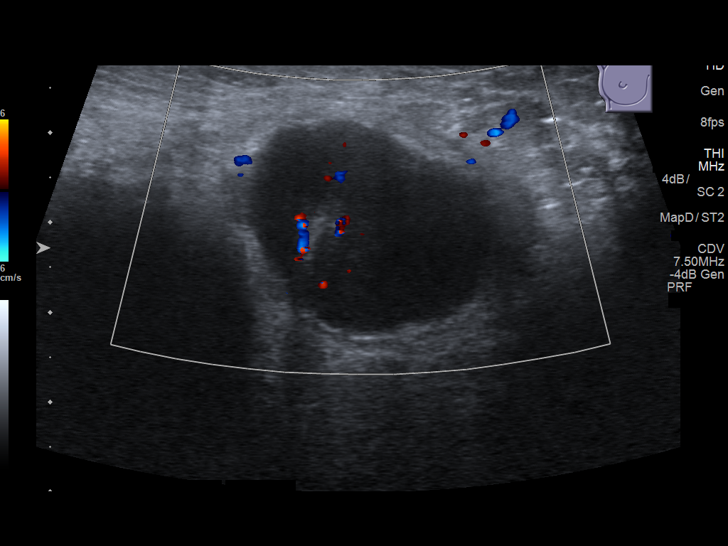
[im 8/20]
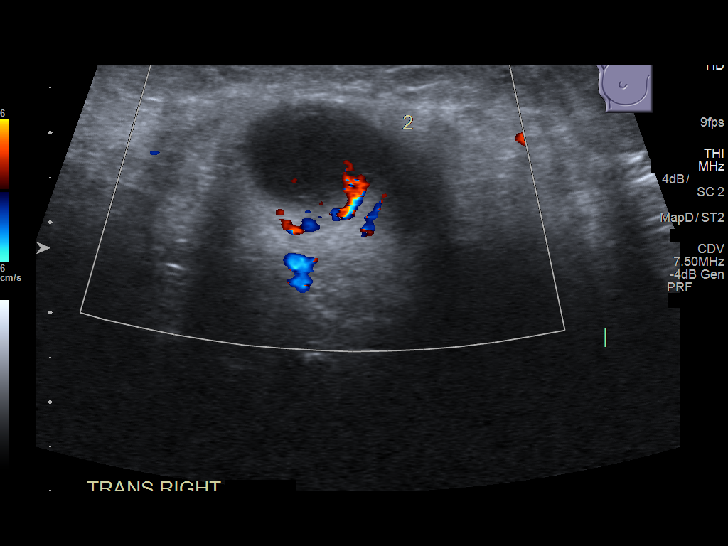
[im 10/20]
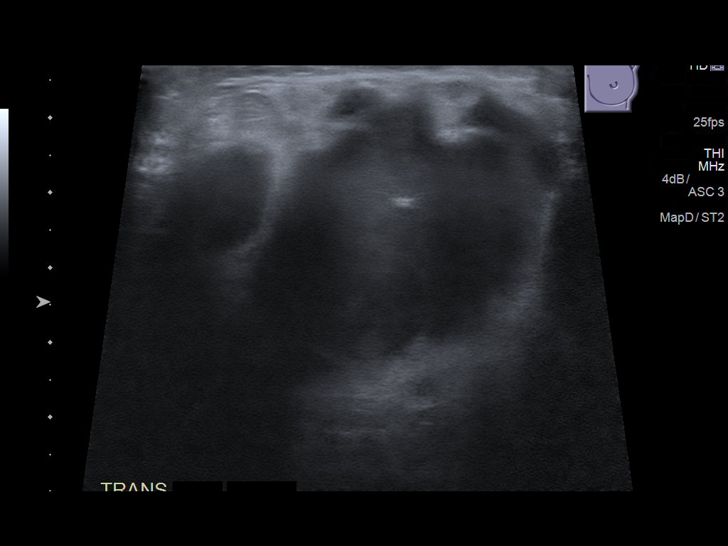
[im 11/20]
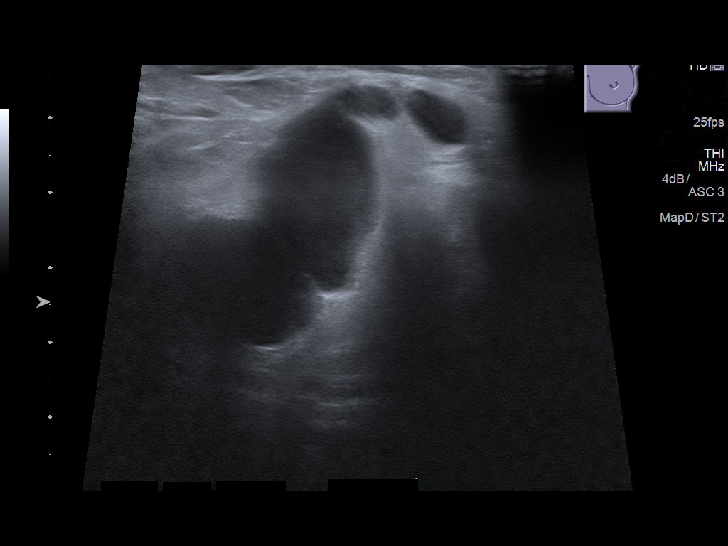
[im 13/20]
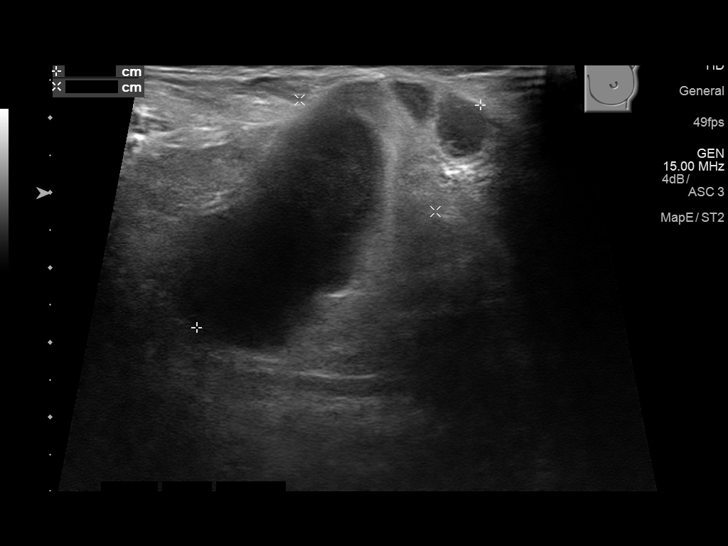
[im 14/20]
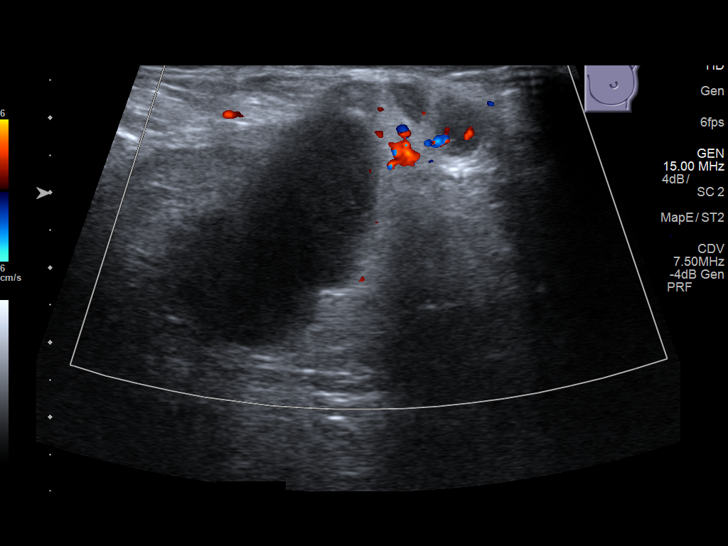
[im 16/20]
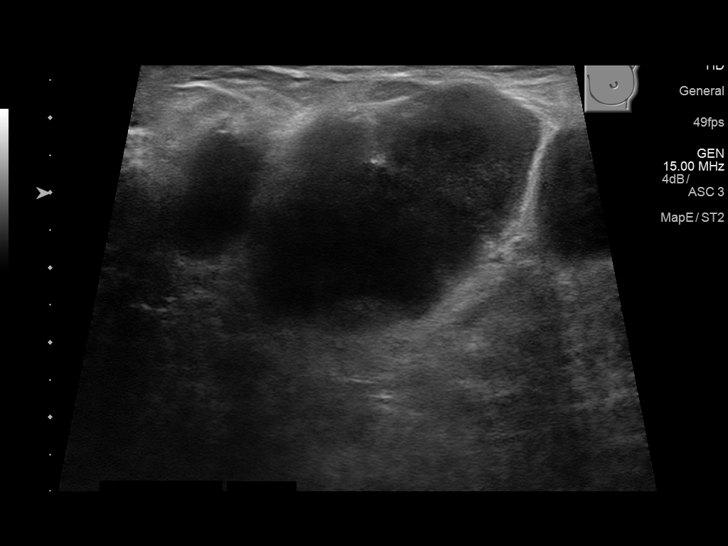
[im 17/20]
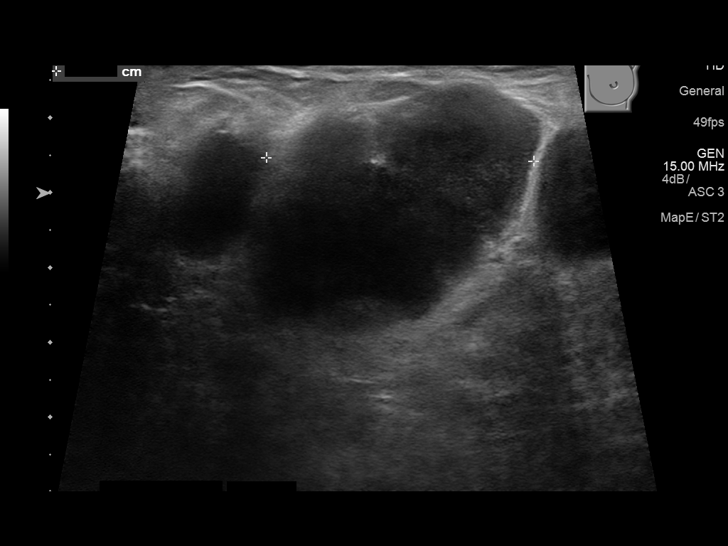
[im 18/20]
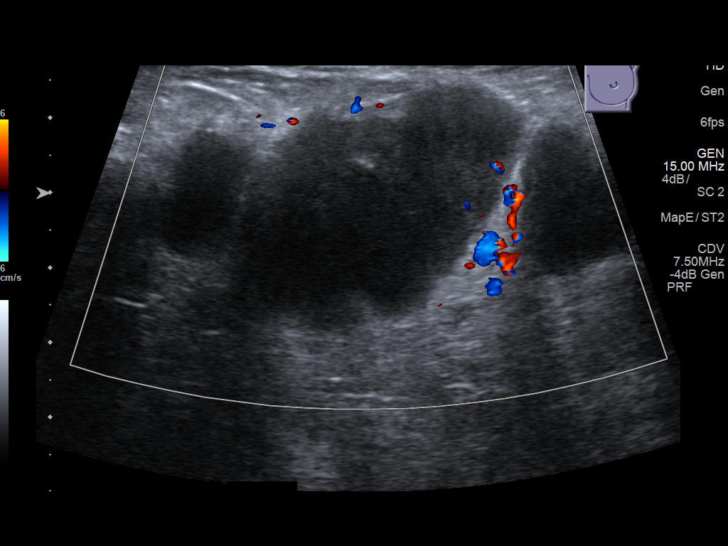
[im 20/20]
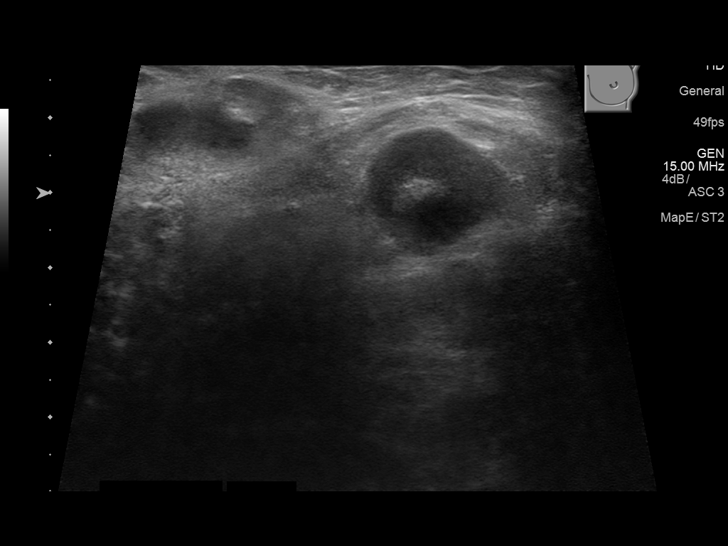

[14 of 20 positions shown; findings below may reference images not displayed]

FINDINGS: Ultrasound of both axillary regions was performed. There are
multiple enlarged nearly anechoic lymph nodes in each axillary
region. The largest axillary lymph node on the right measures 3.2 x
2.5 x 2.8 cm. The largest lymph node on the left measures 4.8 x
x 3.6 cm.
IMPRESSION: Extensive axillary adenopathy bilaterally. These lymph nodes are
virtually anechoic, an appearance that may be seen associated with
lymphoma.

## 2016-11-08 IMAGING — CT CT ABD-PELV W/ CM
1 of 3 series · 13 of 32 positions shown, 18 images · IV contrast (omnipaque)
Comparison: 11/22/2015

CLINICAL DATA: Generalized weakness and diffuse abdominal pain with
nausea and diarrhea for the past 2 days.

EXAM:
CT ABDOMEN AND PELVIS WITH CONTRAST
TECHNIQUE: Multidetector CT imaging of the abdomen and pelvis was performed
using the standard protocol following bolus administration of
intravenous contrast.
CONTRAST:  100mL OMNIPAQUE IOHEXOL 300 MG/ML  SOLN

[Series 2: routine abd pel with · axial · 0.77mm/px · z∈[-482,-32]mm · 13 of 102 slices shown, 18 images]
[im 6/102  soft-tissue]
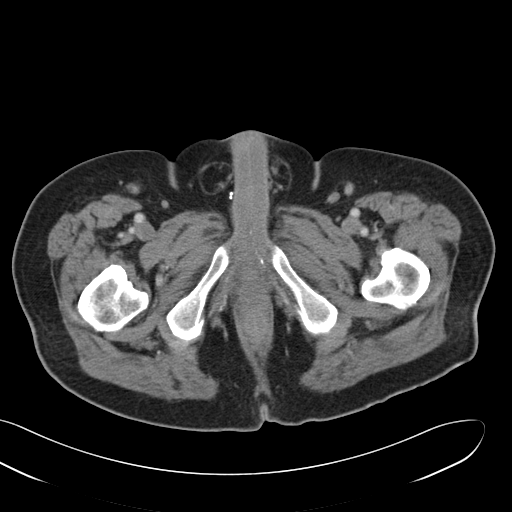
[im 6/102  bone]
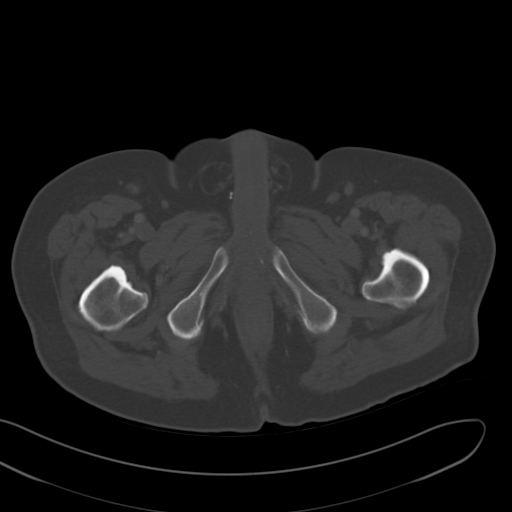
[im 16/102  soft-tissue]
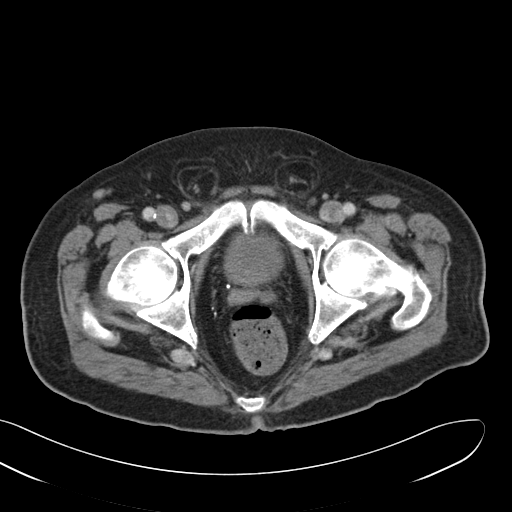
[im 22/102  soft-tissue]
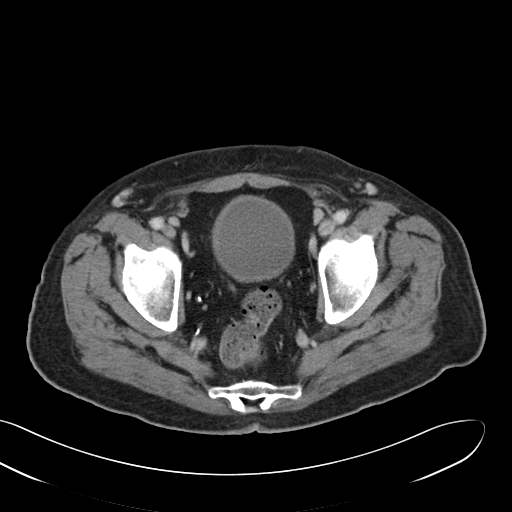
[im 32/102  soft-tissue]
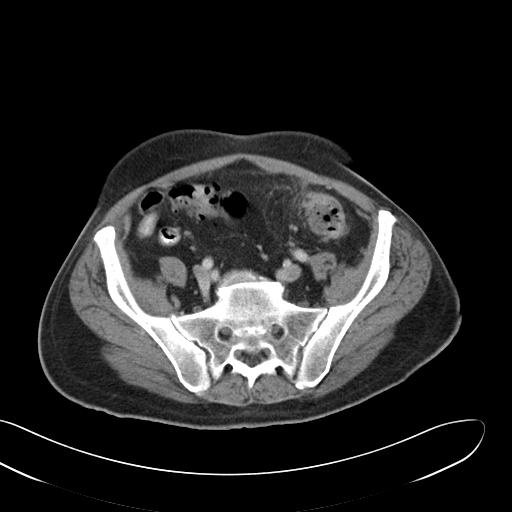
[im 38/102  soft-tissue]
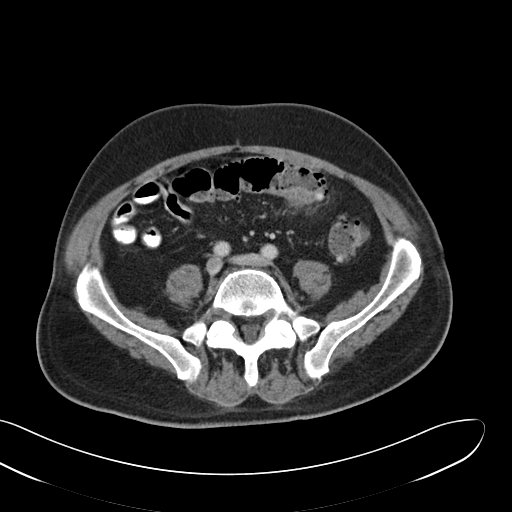
[im 48/102  soft-tissue]
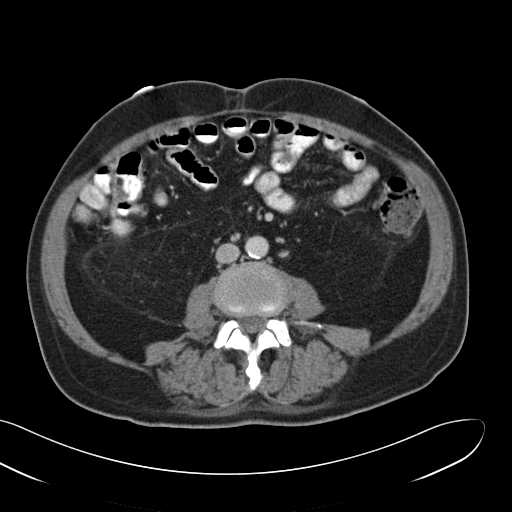
[im 54/102  soft-tissue]
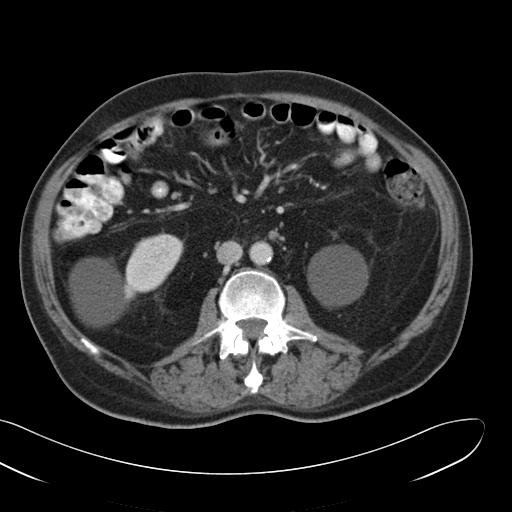
[im 64/102  soft-tissue]
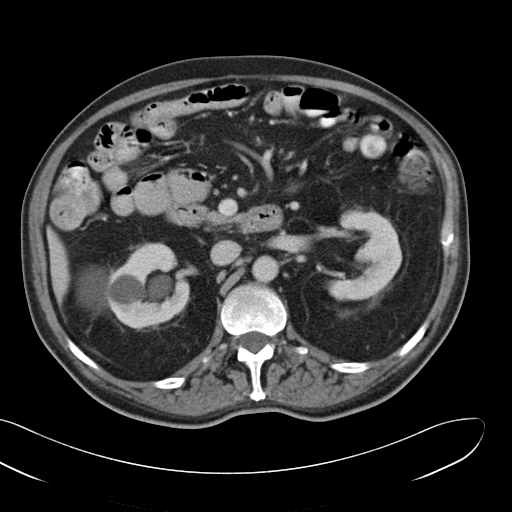
[im 70/102  soft-tissue]
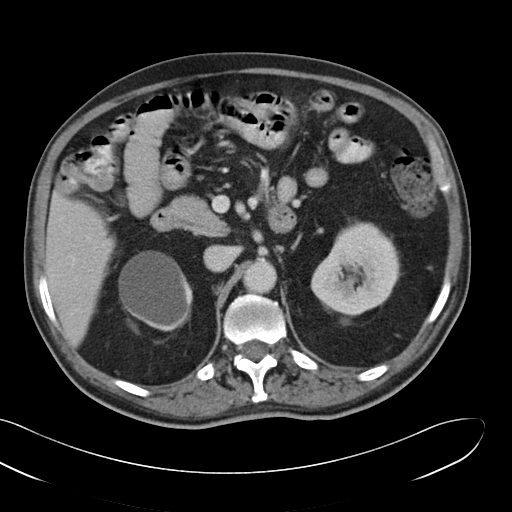
[im 70/102  bone]
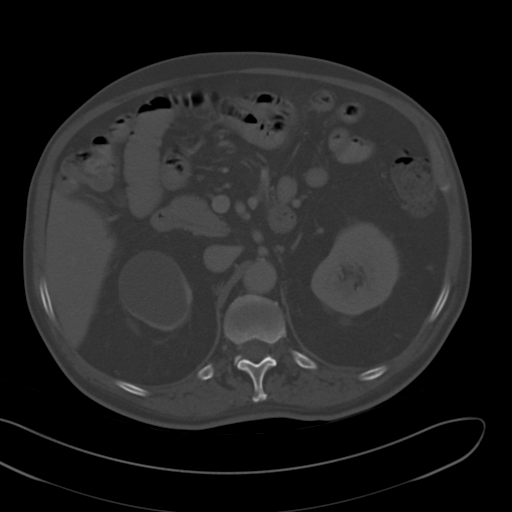
[im 80/102  soft-tissue]
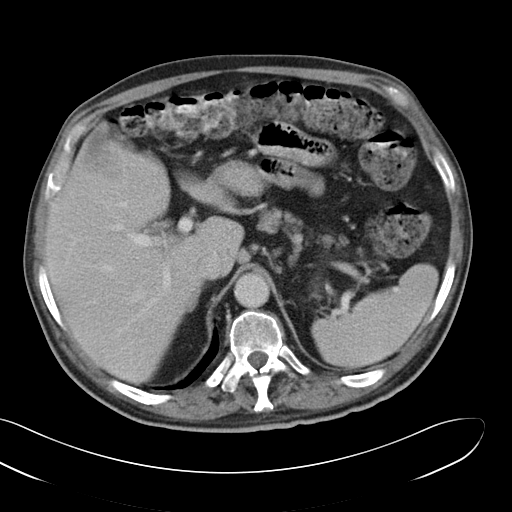
[im 80/102  lung]
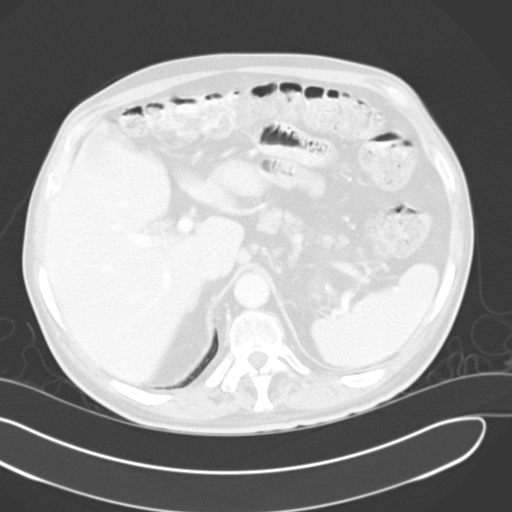
[im 86/102  soft-tissue]
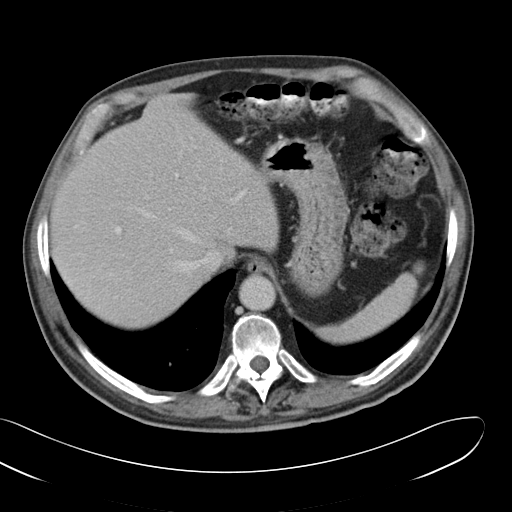
[im 86/102  lung]
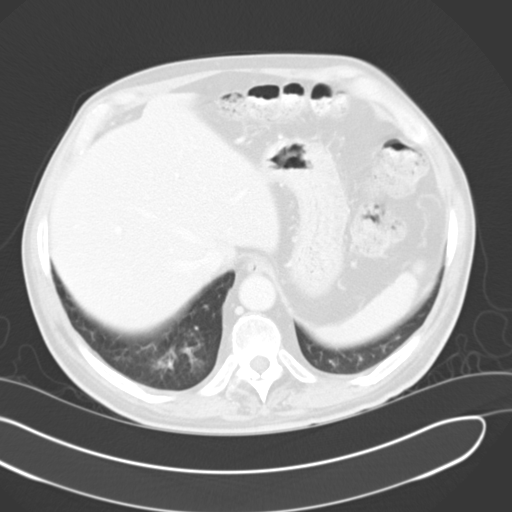
[im 91/102  lung]
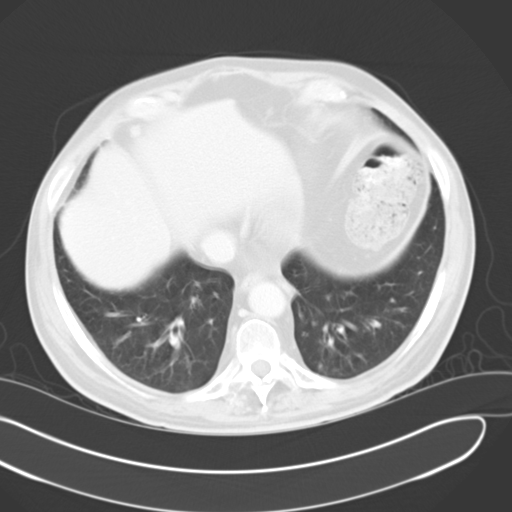
[im 96/102  soft-tissue]
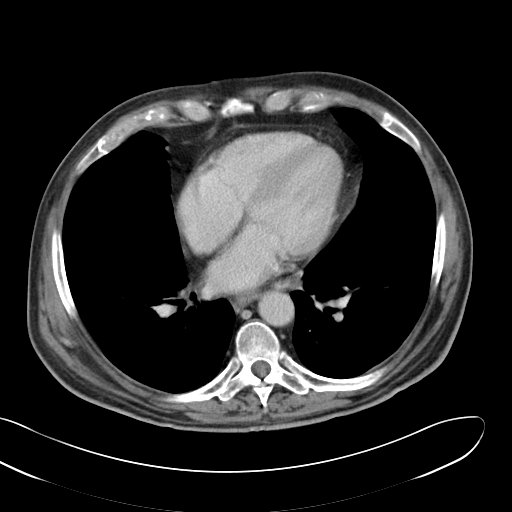
[im 96/102  lung]
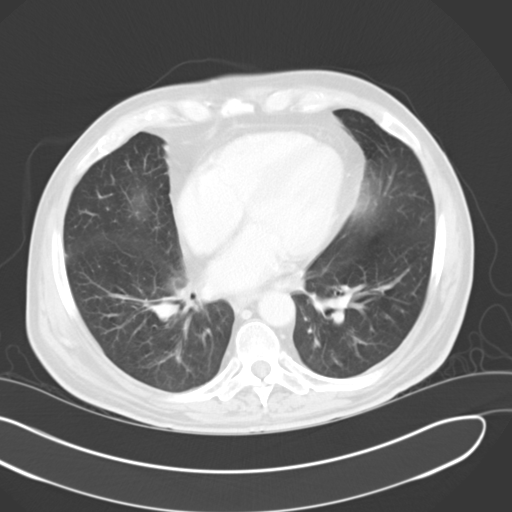

[13 of 32 positions shown; findings below may reference images not displayed]

FINDINGS: Lower chest: Bibasilar atelectasis but no effusions or worrisome
pulmonary lesions. Stable bilateral low-attenuation right infrahilar
lymph nodes.

Hepatobiliary: No focal hepatic lesions or intrahepatic biliary
dilatation. The gallbladder is normal. No common bile duct
dilatation.

Pancreas: No mass, inflammation or ductal dilatation.

Spleen: Normal size.  No focal lesions.

Adrenals/Urinary Tract: The adrenal glands are normal in stable.

Stable large bilateral renal cysts. No worrisome renal lesions or
hydronephrosis.

Stomach/Bowel: The stomach, duodenum and small bowel are
unremarkable. No inflammatory changes, mass lesions or obstructive
findings. The terminal ileum is normal. The appendix is normal.

Slight worsening of diverticulitis involving the upper sigmoid
colon. No complicating features such as abscess or free air. The
remainder of the colon is unremarkable. Moderate stool throughout.

Vascular/Lymphatic: No mesenteric or retroperitoneal mass or
adenopathy. Small scattered lymph nodes are stable. The aorta and
branch vessels are patent. The major venous structures are patent.

Other: No pelvic mass or adenopathy. No free pelvic fluid
collections. The bladder appears normal. The prostate gland and
seminal vesicles are unremarkable. Stable prominent bilateral
inguinal rings containing fat. Stable scattered inguinal lymph
nodes.

Musculoskeletal: No significant bony findings.
IMPRESSION: 1. Progressive findings of acute diverticulitis involving the upper
sigmoid colon. No abscess or free air.
2. Stable bilateral renal cysts and very prominent para
renal/retroperitoneal fat.
